# Patient Record
Sex: Female | Born: 1960
Health system: Southern US, Community
[De-identification: ages and names within clinical notes are randomized; demographics above are authoritative.]

## PROBLEM LIST (undated history)

## (undated) DIAGNOSIS — Z87442 Personal history of urinary calculi: Secondary | ICD-10-CM

## (undated) DIAGNOSIS — R002 Palpitations: Principal | ICD-10-CM

## (undated) DIAGNOSIS — E781 Pure hyperglyceridemia: Secondary | ICD-10-CM

## (undated) DIAGNOSIS — Z1211 Encounter for screening for malignant neoplasm of colon: Secondary | ICD-10-CM

## (undated) DIAGNOSIS — M75102 Unspecified rotator cuff tear or rupture of left shoulder, not specified as traumatic: Secondary | ICD-10-CM

## (undated) DIAGNOSIS — I10 Essential (primary) hypertension: Secondary | ICD-10-CM

## (undated) DIAGNOSIS — K648 Other hemorrhoids: Secondary | ICD-10-CM

## (undated) DIAGNOSIS — M797 Fibromyalgia: Secondary | ICD-10-CM

## (undated) DIAGNOSIS — E039 Hypothyroidism, unspecified: Secondary | ICD-10-CM

## (undated) DIAGNOSIS — IMO0002 Reserved for concepts with insufficient information to code with codable children: Secondary | ICD-10-CM

## (undated) DIAGNOSIS — G8929 Other chronic pain: Secondary | ICD-10-CM

## (undated) DIAGNOSIS — M170 Bilateral primary osteoarthritis of knee: Secondary | ICD-10-CM

## (undated) DIAGNOSIS — K589 Irritable bowel syndrome without diarrhea: Secondary | ICD-10-CM

## (undated) DIAGNOSIS — G4730 Sleep apnea, unspecified: Secondary | ICD-10-CM

## (undated) DIAGNOSIS — M792 Neuralgia and neuritis, unspecified: Secondary | ICD-10-CM

## (undated) DIAGNOSIS — S83529A Sprain of posterior cruciate ligament of unspecified knee, initial encounter: Secondary | ICD-10-CM

## (undated) DIAGNOSIS — K760 Fatty (change of) liver, not elsewhere classified: Secondary | ICD-10-CM

## (undated) DIAGNOSIS — J45909 Unspecified asthma, uncomplicated: Secondary | ICD-10-CM

## (undated) DIAGNOSIS — E559 Vitamin D deficiency, unspecified: Secondary | ICD-10-CM

## (undated) DIAGNOSIS — M545 Low back pain, unspecified: Secondary | ICD-10-CM

## (undated) DIAGNOSIS — F419 Anxiety disorder, unspecified: Secondary | ICD-10-CM

## (undated) DIAGNOSIS — T7840XA Allergy, unspecified, initial encounter: Secondary | ICD-10-CM

## (undated) HISTORY — DX: Reserved for concepts with insufficient information to code with codable children: IMO0002

## (undated) HISTORY — DX: Other hemorrhoids: K64.8

## (undated) HISTORY — DX: Hypothyroidism, unspecified: E03.9

## (undated) HISTORY — DX: Allergy, unspecified, initial encounter: T78.40XA

## (undated) HISTORY — DX: Unspecified rotator cuff tear or rupture of left shoulder, not specified as traumatic: M75.102

## (undated) HISTORY — DX: Unspecified asthma, uncomplicated: J45.909

## (undated) HISTORY — DX: Low back pain, unspecified: M54.50

## (undated) HISTORY — DX: Sprain of posterior cruciate ligament of unspecified knee, initial encounter: S83.529A

## (undated) HISTORY — PX: TOTAL ABDOMINAL HYSTERECTOMY W/ BILATERAL SALPINGOOPHORECTOMY: SHX83

## (undated) HISTORY — DX: Fatty (change of) liver, not elsewhere classified: K76.0

## (undated) HISTORY — DX: Vitamin D deficiency, unspecified: E55.9

## (undated) HISTORY — DX: Other chronic pain: G89.29

## (undated) HISTORY — DX: Bilateral primary osteoarthritis of knee: M17.0

## (undated) HISTORY — DX: Encounter for screening for malignant neoplasm of colon: Z12.11

## (undated) HISTORY — DX: Irritable bowel syndrome, unspecified: K58.9

## (undated) HISTORY — DX: Neuralgia and neuritis, unspecified: M79.2

## (undated) HISTORY — DX: Fibromyalgia: M79.7

## (undated) HISTORY — DX: Palpitations: R00.2

---

## 2001-09-03 ENCOUNTER — Encounter: Admission: RE | Admit: 2001-09-03 | Discharge: 2001-09-03 | Payer: Self-pay | Admitting: Family Medicine

## 2005-01-08 ENCOUNTER — Encounter: Admission: RE | Admit: 2005-01-08 | Discharge: 2005-01-08 | Payer: Self-pay | Admitting: Family Medicine

## 2011-03-25 ENCOUNTER — Encounter: Payer: Self-pay | Admitting: *Deleted

## 2011-03-25 DIAGNOSIS — E079 Disorder of thyroid, unspecified: Secondary | ICD-10-CM | POA: Insufficient documentation

## 2011-03-25 DIAGNOSIS — S8010XA Contusion of unspecified lower leg, initial encounter: Secondary | ICD-10-CM | POA: Insufficient documentation

## 2011-03-25 DIAGNOSIS — M79609 Pain in unspecified limb: Secondary | ICD-10-CM | POA: Insufficient documentation

## 2011-03-25 DIAGNOSIS — S92309A Fracture of unspecified metatarsal bone(s), unspecified foot, initial encounter for closed fracture: Secondary | ICD-10-CM | POA: Insufficient documentation

## 2011-03-25 DIAGNOSIS — I10 Essential (primary) hypertension: Secondary | ICD-10-CM | POA: Insufficient documentation

## 2011-03-25 DIAGNOSIS — W108XXA Fall (on) (from) other stairs and steps, initial encounter: Secondary | ICD-10-CM | POA: Insufficient documentation

## 2011-03-25 DIAGNOSIS — IMO0002 Reserved for concepts with insufficient information to code with codable children: Secondary | ICD-10-CM | POA: Insufficient documentation

## 2011-03-25 DIAGNOSIS — Z79899 Other long term (current) drug therapy: Secondary | ICD-10-CM | POA: Insufficient documentation

## 2011-03-25 DIAGNOSIS — S93609A Unspecified sprain of unspecified foot, initial encounter: Secondary | ICD-10-CM | POA: Insufficient documentation

## 2011-03-25 NOTE — ED Notes (Signed)
Pt fell off of brick steps onto a cement landing while taking the dog out. Injury to bilateral LE. Pt has abrasions/bruising to anterior left lower extremity

## 2011-03-26 ENCOUNTER — Emergency Department (INDEPENDENT_AMBULATORY_CARE_PROVIDER_SITE_OTHER)
Admit: 2011-03-26 | Discharge: 2011-03-26 | Disposition: A | Discharge status: Home / Self Care | Payer: Commercial Managed Care - PPO | Attending: Emergency Medicine | Admitting: Emergency Medicine

## 2011-03-26 ENCOUNTER — Emergency Department (HOSPITAL_BASED_OUTPATIENT_CLINIC_OR_DEPARTMENT_OTHER)
Admission: EM | Admit: 2011-03-26 | Discharge: 2011-03-26 | Disposition: A | Discharge status: Home / Self Care | Payer: Commercial Managed Care - PPO | Attending: Emergency Medicine | Admitting: Emergency Medicine

## 2011-03-26 DIAGNOSIS — T07XXXA Unspecified multiple injuries, initial encounter: Secondary | ICD-10-CM

## 2011-03-26 DIAGNOSIS — W19XXXA Unspecified fall, initial encounter: Secondary | ICD-10-CM

## 2011-03-26 DIAGNOSIS — M79609 Pain in unspecified limb: Secondary | ICD-10-CM

## 2011-03-26 DIAGNOSIS — S92314A Nondisplaced fracture of first metatarsal bone, right foot, initial encounter for closed fracture: Secondary | ICD-10-CM

## 2011-03-26 DIAGNOSIS — S93601A Unspecified sprain of right foot, initial encounter: Secondary | ICD-10-CM

## 2011-03-26 HISTORY — DX: Pure hyperglyceridemia: E78.1

## 2011-03-26 HISTORY — DX: Essential (primary) hypertension: I10

## 2011-03-26 MED ORDER — BACITRACIN 500 UNIT/GM EX OINT
1.0000 "application " | TOPICAL_OINTMENT | Freq: Two times a day (BID) | CUTANEOUS | Status: DC
  Administered 2011-03-26: 1 via TOPICAL
  Filled 2011-03-26: qty 0.9

## 2011-03-26 MED ORDER — HYDROCODONE-ACETAMINOPHEN 5-325 MG PO TABS
1.0000 | ORAL_TABLET | Freq: Four times a day (QID) | ORAL | Status: AC | PRN
Start: 2011-03-26 — End: 2011-04-05

## 2011-03-26 MED ORDER — TETANUS-DIPHTH-ACELL PERTUSSIS 5-2.5-18.5 LF-MCG/0.5 IM SUSP
0.5000 mL | Freq: Once | INTRAMUSCULAR | Status: AC
  Administered 2011-03-26: 0.5 mL via INTRAMUSCULAR

## 2011-03-26 MED ORDER — HYDROCODONE-ACETAMINOPHEN 5-325 MG PO TABS
1.0000 | ORAL_TABLET | Freq: Once | ORAL | Status: AC
  Administered 2011-03-26: 1 via ORAL
  Filled 2011-03-26: qty 1

## 2011-03-26 NOTE — ED Provider Notes (Addendum)
History     Chief Complaint  Patient presents with  . Leg Pain  . Fall   HPI She was at home yesterday evening about 10:30 when she was walking down some steps. She missed a step and fell injuring her right and left feet as well as her left lower leg. She is having moderate to severe pain in the right foot upon attempted ambulation. She is having moderate pain in the lateral left foot on attempted ambulation. There are abrasions of the left anterior lower leg. She denies neck pain or other injury. She does have pain associated with an L4 radiculopathy but this has not acutely changed. Past Medical History  Diagnosis Date  . High triglycerides   . Thyroid disease   . Hypertension     Past Surgical History  Procedure Date  . Abdominal hysterectomy     History reviewed. No pertinent family history.  History  Substance Use Topics  . Smoking status: Never Smoker   . Smokeless tobacco: Not on file  . Alcohol Use: Yes     rarely      Review of Systems  All other systems reviewed and are negative.    Physical Exam  BP 138/96  Pulse 78  Temp(Src) 98.1 F (36.7 C) (Oral)  Resp 18  Ht 5\' 3"  (1.6 m)  Wt 260 lb (117.935 kg)  BMI 46.06 kg/m2  SpO2 99%  Physical Exam General: Well-developed, well-nourished female in no acute distress; appearance consistent with age of record HEENT: normocephalic, atraumatic Eyes: pupils equal round and reactive to light; extraocular muscles intact Neck: supple; nontender Heart: regular rate and rhythm; no murmurs, rubs or gallops Lungs: clear to auscultation bilaterally Abdomen: soft; nontender; nondistended; no masses or hepatosplenomegaly; bowel sounds present Extremities: There is moderate point tenderness at the base of the right first metatarsal coinciding with a suspected fracture noted by the radiologist. There are multiple abrasions and ecchymoses of the left anterior lower leg. There is mild to moderate tenderness along the lateral  aspect of the left foot. There is no point tenderness at the point of the left navicular noted by the radiologist. Neurologic: Awake, alert and oriented;motor function intact in all extremities and symmetric;sensation grossly intact; no facial droop Psychiatric: Normal mood and affect   ED Course  Procedures  MDM Nursing notes and vitals signs reviewed.  Summary of this visit's results, reviewed by myself:  Labs:  No results found for this or any previous visit.  Imaging Studies: Dg Tibia/fibula Left  03/26/2011  *RADIOLOGY REPORT*  Clinical Data: 50 year old female status post fall with pain.  LEFT TIBIA AND FIBULA - 2 VIEW  Comparison: None.  Findings: Bone mineralization is within normal limits.  Grossly normal alignment at the knee and ankle.  Calcaneus appears intact. No ankle joint effusion.  Left tibia and fibula appear intact.  Cortical irregularity along the dorsal surface of the navicular. No definite soft tissue swelling in this region.  IMPRESSION: 1.  Left tibia and fibula appear intact. 2.  Cortical irregularity along the dorsal surface of the left navicular.  See addendum to the left foot report.  Original Report Authenticated By: Harley Hallmark, M.D.   Dg Foot Complete Left  03/26/2011  ADDENDUM CREATED: 03/26/2011 00:46:46  Cortical irregularity along the dorsal surface of the navicular, also seen on the left tib-fib series from today.  This might be degenerative, but if there is positive point tenderness suspect acute nondisplaced fracture.  END ADDENDUM SIGNED BY: H.  Augusto Gamble III, M.D.   03/26/2011  *RADIOLOGY REPORT*  Clinical Data: 49 year old female status post fall with pain.  LEFT FOOT - COMPLETE 3+ VIEW  Comparison: None.  Findings: Bone mineralization is within normal limits.  Calcaneus appears intact.  Joint spaces within normal limits.  No acute fracture or dislocation identified.  IMPRESSION: No acute fracture or dislocation identified about the left foot.  Original  Report Authenticated By: Harley Hallmark, M.D.   Dg Foot Complete Right  03/26/2011  *RADIOLOGY REPORT*  Clinical Data: 50 year old female with fall, pain.  RIGHT FOOT COMPLETE - 3+ VIEW  Comparison: None.  Findings: Bone mineralization is within normal limits.  Calcaneus appears intact.  Joint spaces within normal limits.  Subtle cortical irregularity at the medial base of the right first metatarsal.  This extends to the joint space with the medial cuneiform.  Otherwise no acute fracture or dislocation.  IMPRESSION: 1.  Irregularity suspicious for minimally-displaced fracture at the medial base of the right first metatarsal. Query point tenderness. 2.  Otherwise no acute fracture or dislocation in the right foot.  Original Report Authenticated By: Harley Hallmark, M.D.    We'll splint right lower extremity and provide crutches and orthopedic followup.      Hanley Seamen, MD 03/26/11 1610  Hanley Seamen, MD 03/26/11 [id number]  Hanley Seamen, MD 03/26/11 [id number]  Hanley Seamen, MD 03/26/11 Donnal Debar  Carlisle Beers Quinlyn Tep, MD 03/26/11 [id number]

## 2011-03-26 NOTE — ED Notes (Signed)
Abrasions to left lower leg cleaned, moist dressing applied. Pt refuses ice pack at this time.

## 2011-10-26 ENCOUNTER — Ambulatory Visit: Payer: 59

## 2011-10-26 ENCOUNTER — Ambulatory Visit (INDEPENDENT_AMBULATORY_CARE_PROVIDER_SITE_OTHER): Payer: 59 | Admitting: Internal Medicine

## 2011-10-26 VITALS — BP 150/84 | HR 86 | Temp 98.5°F | Resp 16 | Ht 63.0 in | Wt 271.0 lb

## 2011-10-26 DIAGNOSIS — R059 Cough, unspecified: Secondary | ICD-10-CM

## 2011-10-26 DIAGNOSIS — J40 Bronchitis, not specified as acute or chronic: Secondary | ICD-10-CM

## 2011-10-26 DIAGNOSIS — R05 Cough: Secondary | ICD-10-CM

## 2011-10-26 DIAGNOSIS — J9801 Acute bronchospasm: Secondary | ICD-10-CM

## 2011-10-26 LAB — POCT CBC
MCH, POC: 28 pg (ref 27–31.2)
MCV: 83.3 fL (ref 80–97)
MID (cbc): 0.7 (ref 0–0.9)
MPV: 8.1 fL (ref 0–99.8)
POC LYMPH PERCENT: 19.5 %L (ref 10–50)
POC MID %: 8.9 %M (ref 0–12)
Platelet Count, POC: 346 10*3/uL (ref 142–424)
RDW, POC: 14.6 %
WBC: 7.7 10*3/uL (ref 4.6–10.2)

## 2011-10-26 MED ORDER — BECLOMETHASONE DIPROPIONATE 40 MCG/ACT IN AERS: 1.0000 | INHALATION_SPRAY | Freq: Two times a day (BID) | RESPIRATORY_TRACT | Status: DC

## 2011-10-26 MED ORDER — AZITHROMYCIN 250 MG PO TABS: ORAL_TABLET | ORAL | Status: AC

## 2011-10-26 MED ORDER — ALBUTEROL SULFATE (2.5 MG/3ML) 0.083% IN NEBU
2.5000 mg | INHALATION_SOLUTION | Freq: Once | RESPIRATORY_TRACT | Status: AC
  Administered 2011-10-26: 2.5 mg via RESPIRATORY_TRACT

## 2011-10-26 MED ORDER — HYDROCODONE-HOMATROPINE 5-1.5 MG/5ML PO SYRP: ORAL_SOLUTION | ORAL | Status: AC

## 2011-10-26 MED ORDER — ALBUTEROL SULFATE HFA 108 (90 BASE) MCG/ACT IN AERS: 2.0000 | INHALATION_SPRAY | RESPIRATORY_TRACT | Status: DC | PRN

## 2011-10-26 MED ORDER — IPRATROPIUM BROMIDE 0.02 % IN SOLN
0.5000 mg | Freq: Once | RESPIRATORY_TRACT | Status: AC
  Administered 2011-10-26: 0.5 mg via RESPIRATORY_TRACT

## 2011-10-26 NOTE — Progress Notes (Signed)
Patient ID: Brenda Schroeder MRN: [id number], DOB: [birth year], 51 y.o. Date of Encounter: 10/26/2011, 9:00 AM  Primary Physician: Dr. Tresa Endo  Chief Complaint:  Chief Complaint  Patient presents with  . Wheezing    since Friday  . Cough    chest congestion    HPI: 51 y.o. year old female presents with 4 day history of cough and wheezing. Cough is mildly productive of yellow sputum, not associated with time of day. Wheezing worse at night. Subjective tactile fever and chills. Temperature at home has been 97. No nasal congestion, sore throat, or post nasal drip. Hearing is normal. Normal appetite. Does have an albuterol inhaler at home for allergic rhinitis. Has used this several times over the past few days with minimal results. Denies history of asthma. No tobacco use. No GI symptoms. No Recent antibiotics  No leg trauma, sedentary periods, h/o cancer, or tobacco use.  Past Medical History  Diagnosis Date  . High triglycerides   . Thyroid disease   . Hypertension      Home Meds: Prior to Admission medications   Medication Sig Start Date End Date Taking? Authorizing Provider  albuterol (PROVENTIL HFA;VENTOLIN HFA) 108 (90 BASE) MCG/ACT inhaler Inhale 2 puffs into the lungs every 6 (six) hours as needed.   Yes Historical Provider, MD  levothyroxine (SYNTHROID, LEVOTHROID) 150 MCG tablet Take 150 mcg by mouth daily.     Yes Historical Provider, MD  estrogens, conjugated, (PREMARIN) 0.625 MG tablet Take 0.625 mg by mouth daily. Take daily for 21 days then do not take for 7 days.    No Historical Provider, MD    Allergies:  Allergies  Allergen Reactions  . Nsaids Anaphylaxis  . Sulfa Antibiotics Rash    History   Social History  . Marital Status: Single    Spouse Name: N/A    Number of Children: N/A  . Years of Education: N/A   Occupational History  . Not on file.   Social History Main Topics  . Smoking status: Never Smoker   . Smokeless tobacco: Not on file  . Alcohol  Use: Yes     rarely  . Drug Use: No  . Sexually Active:    Other Topics Concern  . Not on file   Social History Narrative  . No narrative on file     Review of Systems: Constitutional: negative for chills, fever, night sweats or weight changes Cardiovascular: negative for chest pain or palpitations Respiratory: negative for hemoptysis, or shortness of breath Abdominal: negative for abdominal pain, nausea, vomiting or diarrhea Dermatological: negative for rash Neurologic: negative for headache   Physical Exam: Blood pressure 150/84, pulse 86, temperature 98.5 F (36.9 C), temperature source Oral, resp. rate 16, height 5\' 3"  (1.6 m), weight 271 lb (122.925 kg)., Body mass index is 48.01 kg/(m^2). General: Well developed, well nourished, in no acute distress. Head: Normocephalic, atraumatic, eyes without discharge, sclera non-icteric, nares are congested. Bilateral auditory canals clear, TM's are without perforation, pearly grey with reflective cone of light bilaterally. Oral cavity moist, dentition normal. Posterior pharynx with mild post nasal drip and mild erythema. No peritonsillar abscess or tonsillar exudate. Neck: Supple. No thyromegaly. Full ROM. No lymphadenopathy. Lungs: Coarse breath sounds and mild expiratory wheezing bilaterally without rales, or rhonchi. Breathing is unlabored. Heart: RRR with S1 S2. No murmurs, rubs, or gallops appreciated. Msk:  Strength and tone normal for age. Extremities: No clubbing or cyanosis. No edema. Neuro: Alert and oriented X 3. Moves all  extremities spontaneously. CNII-XII grossly in tact. Psych:  Responds to questions appropriately with a normal affect.   S/P albuterol/atrovent neb: Feels better. Wheezing improved. S/p 2nd albuterol neb: continues to improve. No further wheezing.  Labs:  [facility] reading (PRIMARY) by  Dr. Perrin Maltese. Increased markings bilaterally without infiltrate.   ASSESSMENT AND PLAN:  51 y.o. year old female with  bronchitis/RAD/cough -Azithromycin 250 MG #6 2 po first day then 1 po next 4 days no RF -Hycodan #4oz 1 tsp po q 4-6 hours prn cough no RF SED -Qvar 40 mcg #1 1 puff inhaled bid no RF -Proventil #1 2 puffs inhaled q 4-6 hours prn Rf 2 -Albuterol neb in office x 2 -Atrovent neb in office -Mucinex -Tylenol/Motrin prn -Rest/fluids -RTC precautions -RTC 3-5 days if no improvement  Signed, Eula Listen, PA-C 10/26/2011 9:00 AM

## 2011-10-28 ENCOUNTER — Ambulatory Visit (INDEPENDENT_AMBULATORY_CARE_PROVIDER_SITE_OTHER): Payer: 59 | Admitting: Family Medicine

## 2011-10-28 VITALS — BP 146/89 | HR 67 | Temp 98.0°F | Resp 16 | Ht 62.5 in | Wt 270.8 lb

## 2011-10-28 DIAGNOSIS — Z79899 Other long term (current) drug therapy: Secondary | ICD-10-CM

## 2011-10-28 DIAGNOSIS — J9801 Acute bronchospasm: Secondary | ICD-10-CM

## 2011-10-28 DIAGNOSIS — R0609 Other forms of dyspnea: Secondary | ICD-10-CM

## 2011-10-28 DIAGNOSIS — R05 Cough: Secondary | ICD-10-CM

## 2011-10-28 DIAGNOSIS — R0600 Dyspnea, unspecified: Secondary | ICD-10-CM

## 2011-10-28 DIAGNOSIS — R059 Cough, unspecified: Secondary | ICD-10-CM

## 2011-10-28 DIAGNOSIS — R0989 Other specified symptoms and signs involving the circulatory and respiratory systems: Secondary | ICD-10-CM

## 2011-10-28 LAB — POCT CBC
Granulocyte percent: 59.1 %G (ref 37–80)
HCT, POC: 39.9 % (ref 37.7–47.9)
Hemoglobin: 12.8 g/dL (ref 12.2–16.2)
POC Granulocyte: 4 (ref 2–6.9)
RBC: 4.74 M/uL (ref 4.04–5.48)

## 2011-10-28 LAB — GLUCOSE, POCT (MANUAL RESULT ENTRY): POC Glucose: 88

## 2011-10-28 MED ORDER — IPRATROPIUM BROMIDE 0.02 % IN SOLN
0.5000 mg | Freq: Once | RESPIRATORY_TRACT | Status: AC
  Administered 2011-10-28: 0.5 mg via RESPIRATORY_TRACT

## 2011-10-28 MED ORDER — PREDNISONE 20 MG PO TABS: ORAL_TABLET | ORAL | Status: DC

## 2011-10-28 MED ORDER — ALBUTEROL SULFATE (2.5 MG/3ML) 0.083% IN NEBU
2.5000 mg | INHALATION_SOLUTION | Freq: Once | RESPIRATORY_TRACT | Status: AC
  Administered 2011-10-28: 2.5 mg via RESPIRATORY_TRACT

## 2011-10-28 NOTE — Patient Instructions (Addendum)
Continue antibiotic, proventil every 4 to 6 hours as needed, and start prednisone.  Recheck with Eula Listen in 2 days, sooner if worsening.  Return to the clinic or go to the nearest emergency room if any of your symptoms worsen or new symptoms occur.

## 2011-10-28 NOTE — Progress Notes (Signed)
Subjective:    Patient ID: Brenda Schroeder, female    DOB: August 05, 1961, 51 y.o.   MRN: [id number]  HPI  Brenda Schroeder is a 51 y.o. female  Here for f/u cough and wheeze. Seen 2 days ago - 2 sets of nebs given in office,  Treatment at discharge:  Azithromycin 250 MG #6 2 po first day then 1 po next 4 days no RF -Hycodan #4oz 1 tsp po q 4-6 hours prn cough no RF SED -Qvar 40 mcg #1 1 puff inhaled bid no RF -Proventil #1 2 puffs inhaled q 4-6 hours prn Rf 2 -Albuterol neb in office x 2 -Atrovent neb in office -Mucinex -Tylenol/Motrin prn -Rest/fluids  Feels about the same.  Still with wheeze with activity, or cold exposure.  Using proventil every 4 hours, not much change in symptoms, qvar bid, hycodan only as needed - used last night at bedtime. Subjective fever only.  Notes wheeze when lying down. At night. Feels worse lying down.  Props self up to sleep.  More dyspnea if flat.     No hx CHF, only PVC's in past. No leg edema.  No chest  Pains.  Review of Systems  Constitutional: Negative for fever and chills.  HENT: Negative for congestion and rhinorrhea.   Respiratory: Positive for cough, shortness of breath and wheezing.   Cardiovascular: Negative for chest pain and leg swelling.  Skin: Negative for rash.       Objective:   Physical Exam  Constitutional: She is oriented to person, place, and time. She appears well-developed.       overweight  HENT:  Head: Normocephalic and atraumatic.  Cardiovascular: Normal rate, regular rhythm, normal heart sounds and intact distal pulses.   Pulmonary/Chest: Effort normal. No respiratory distress. She has wheezes. She has no rales.  Musculoskeletal: She exhibits no edema.  Neurological: She is alert and oriented to person, place, and time.  Skin: Skin is warm.  Psychiatric: She has a normal mood and affect.   Results for orders placed in visit on 10/28/11  POCT CBC      Component Value Range   WBC 6.7  4.6 - 10.2 (K/uL)   Lymph, poc 2.1   0.6 - 3.4    POC LYMPH PERCENT 30.7  10 - 50 (%L)   MID (cbc) 0.7  0 - 0.9    POC MID % 10.2  0 - 12 (%M)   POC Granulocyte 4.0  2 - 6.9    Granulocyte percent 59.1  37 - 80 (%G)   RBC 4.74  4.04 - 5.48 (M/uL)   Hemoglobin 12.8  12.2 - 16.2 (g/dL)   HCT, POC 40.9  81.1 - 47.9 (%)   MCV 84.1  80 - 97 (fL)   MCH, POC 27.0  27 - 31.2 (pg)   MCHC 32.1  31.8 - 35.4 (g/dL)   RDW, POC 91.4     Platelet Count, POC 328  142 - 424 (K/uL)   MPV 7.8  0 - 99.8 (fL)  GLUCOSE, POCT (MANUAL RESULT ENTRY)      Component Value Range   POC Glucose 88       After nebulizer treatment, lungs clear, and symptomatically improved    Assessment & Plan:   1. Bronchospasm  albuterol (PROVENTIL) (2.5 MG/3ML) 0.083% nebulizer solution 2.5 mg, ipratropium (ATROVENT) nebulizer solution 0.5 mg  2. Dyspnea  POCT CBC, Brain natriuretic peptide  3. Cough  POCT CBC, Brain natriuretic peptide   Nebs given as  above.  Suspect underling RAD/asthamtic bronchitis/flair.  Doubt cardiac cause, but with dyspnea and orthopnea, check BNP.  DDX includes restrictive component/obesity hypoventilation syndrome, but currently suspect bronchospasm as primary cause.  O2 sat stable (95%).  CXR report pending.  Cont azithromycin, proventil q4-6 hrs prn, and QVAR as prescribed.   Check BNP. Start prednisone 20mg  -2 qd x 5 days. Recheck in 2 days with Eula Listen, PA-C, sooner if worse.

## 2011-10-29 LAB — BRAIN NATRIURETIC PEPTIDE: Brain Natriuretic Peptide: 61.5 pg/mL (ref 0.0–100.0)

## 2011-11-17 ENCOUNTER — Ambulatory Visit (INDEPENDENT_AMBULATORY_CARE_PROVIDER_SITE_OTHER): Payer: 59 | Admitting: Family Medicine

## 2011-11-17 VITALS — BP 118/80 | HR 84 | Temp 98.5°F | Resp 18 | Ht 62.5 in | Wt 268.2 lb

## 2011-11-17 DIAGNOSIS — J45901 Unspecified asthma with (acute) exacerbation: Secondary | ICD-10-CM

## 2011-11-17 DIAGNOSIS — E039 Hypothyroidism, unspecified: Secondary | ICD-10-CM

## 2011-11-17 DIAGNOSIS — J45909 Unspecified asthma, uncomplicated: Secondary | ICD-10-CM | POA: Insufficient documentation

## 2011-11-17 MED ORDER — MOMETASONE FURO-FORMOTEROL FUM 200-5 MCG/ACT IN AERO: 2.0000 | INHALATION_SPRAY | Freq: Two times a day (BID) | RESPIRATORY_TRACT | Status: DC

## 2011-11-17 MED ORDER — ALBUTEROL SULFATE (2.5 MG/3ML) 0.083% IN NEBU
2.5000 mg | INHALATION_SOLUTION | Freq: Once | RESPIRATORY_TRACT | Status: AC
  Administered 2011-11-17: 2.5 mg via RESPIRATORY_TRACT

## 2011-11-17 MED ORDER — ALBUTEROL SULFATE (2.5 MG/3ML) 0.083% IN NEBU: 2.5000 mg | INHALATION_SOLUTION | Freq: Four times a day (QID) | RESPIRATORY_TRACT | Status: DC | PRN

## 2011-11-17 MED ORDER — PREDNISONE 20 MG PO TABS: ORAL_TABLET | ORAL | Status: DC

## 2011-11-17 NOTE — Progress Notes (Signed)
51 yo woman with asthma who is here for the third time here.  Her first episode was treated in October when she started with ventolin and z-pack.  Continued to have mild symptoms.  First came here in February 17 and then again February 19.  Prednisone made the most difference.  Still on rescue inhaler and Qvar. 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  O:  Overweight woman in NAD with good color HEENT:  Unremarkable Chest:  Diffuse expiratory wheezes. Heart:  Reg, no murmur  A:  Asthma, to some extent related to the work environment with the home visits.  P:  Nebulizer treatments with albuterol Dulera 200 2 puffs bid. Prednisone 20 3-2-1 Rechk Friday.

## 2011-11-17 NOTE — Patient Instructions (Signed)
Return this FridayAsthma Attack Prevention HOW CAN ASTHMA BE PREVENTED? Currently, there is no way to prevent asthma from starting. However, you can take steps to control the disease and prevent its symptoms after you have been diagnosed. Learn about your asthma and how to control it. Take an active role to control your asthma by working with your caregiver to create and follow an asthma action plan. An asthma action plan guides you in taking your medicines properly, avoiding factors that make your asthma worse, tracking your level of asthma control, responding to worsening asthma, and seeking emergency care when needed. To track your asthma, keep records of your symptoms, check your peak flow number using a peak flow meter (handheld device that shows how well air moves out of your lungs), and get regular asthma checkups.  Other ways to prevent asthma attacks include:  Use medicines as your caregiver directs.   Identify and avoid things that make your asthma worse (as much as you can).   Keep track of your asthma symptoms and level of control.   Get regular checkups for your asthma.   With your caregiver, write a detailed plan for taking medicines and managing an asthma attack. Then be sure to follow your action plan. Asthma is an ongoing condition that needs regular monitoring and treatment.   Identify and avoid asthma triggers. A number of outdoor allergens and irritants (pollen, mold, cold air, air pollution) can trigger asthma attacks. Find out what causes or makes your asthma worse, and take steps to avoid those triggers (see below).   Monitor your breathing. Learn to recognize warning signs of an attack, such as slight coughing, wheezing or shortness of breath. However, your lung function may already decrease before you notice any signs or symptoms, so regularly measure and record your peak airflow with a home peak flow meter.   Identify and treat attacks early. If you act quickly, you're  less likely to have a severe attack. You will also need less medicine to control your symptoms. When your peak flow measurements decrease and alert you to an upcoming attack, take your medicine as instructed, and immediately stop any activity that may have triggered the attack. If your symptoms do not improve, get medical help.   Pay attention to increasing quick-relief inhaler use. If you find yourself relying on your quick-relief inhaler (such as albuterol), your asthma is not under control. See your caregiver about adjusting your treatment.  IDENTIFY AND CONTROL FACTORS THAT MAKE YOUR ASTHMA WORSE A number of common things can set off or make your asthma symptoms worse (asthma triggers). Keep track of your asthma symptoms for several weeks, detailing all the environmental and emotional factors that are linked with your asthma. When you have an asthma attack, go back to your asthma diary to see which factor, or combination of factors, might have contributed to it. Once you know what these factors are, you can take steps to control many of them.  Allergies: If you have allergies and asthma, it is important to take asthma prevention steps at home. Asthma attacks (worsening of asthma symptoms) can be triggered by allergies, which can cause temporary increased inflammation of your airways. Minimizing contact with the substance to which you are allergic will help prevent an asthma attack. Animal Dander:   Some people are allergic to the flakes of skin or dried saliva from animals with fur or feathers. Keep these pets out of your home.   If you can't keep a pet outdoors,  keep the pet out of your bedroom and other sleeping areas at all times, and keep the door closed.   Remove carpets and furniture covered with cloth from your home. If that is not possible, keep the pet away from fabric-covered furniture and carpets.  Dust Mites:  Many people with asthma are allergic to dust mites. Dust mites are tiny bugs  that are found in every home, in mattresses, pillows, carpets, fabric-covered furniture, bedcovers, clothes, stuffed toys, fabric, and other fabric-covered items.   Cover your mattress in a special dust-proof cover.   Cover your pillow in a special dust-proof cover, or wash the pillow each week in hot water. Water must be hotter than 130 F to kill dust mites. Cold or warm water used with detergent and bleach can also be effective.   Wash the sheets and blankets on your bed each week in hot water.   Try not to sleep or lie on cloth-covered cushions.   Call ahead when traveling and ask for a smoke-free hotel room. Bring your own bedding and pillows, in case the hotel only supplies feather pillows and down comforters, which may contain dust mites and cause asthma symptoms.   Remove carpets from your bedroom and those laid on concrete, if you can.   Keep stuffed toys out of the bed, or wash the toys weekly in hot water or cooler water with detergent and bleach.  Cockroaches:  Many people with asthma are allergic to the droppings and remains of cockroaches.   Keep food and garbage in closed containers. Never leave food out.   Use poison baits, traps, powders, gels, or paste (for example, boric acid).   If a spray is used to kill cockroaches, stay out of the room until the odor goes away.  Indoor Mold:  Fix leaky faucets, pipes, or other sources of water that have mold around them.   Clean moldy surfaces with a cleaner that has bleach in it.  Pollen and Outdoor Mold:  When pollen or mold spore counts are high, try to keep your windows closed.   Stay indoors with windows closed from late morning to afternoon, if you can. Pollen and some mold spore counts are highest at that time.   Ask your caregiver whether you need to take or increase anti-inflammatory medicine before your allergy season starts.  Irritants:   Tobacco smoke is an irritant. If you smoke, ask your caregiver how you can  quit. Ask family members to quit smoking, too. Do not allow smoking in your home or car.   If possible, do not use a wood-burning stove, kerosene heater, or fireplace. Minimize exposure to all sources of smoke, including incense, candles, fires, and fireworks.   Try to stay away from strong odors and sprays, such as perfume, talcum powder, hair spray, and paints.   Decrease humidity in your home and use an indoor air cleaning device. Reduce indoor humidity to below 60 percent. Dehumidifiers or central air conditioners can do this.   Try to have someone else vacuum for you once or twice a week, if you can. Stay out of rooms while they are being vacuumed and for a short while afterward.   If you vacuum, use a dust mask from a hardware store, a double-layered or microfilter vacuum cleaner bag, or a vacuum cleaner with a HEPA filter.   Sulfites in foods and beverages can be irritants. Do not drink beer or wine, or eat dried fruit, processed potatoes, or shrimp if they  cause asthma symptoms.   Cold air can trigger an asthma attack. Cover your nose and mouth with a scarf on cold or windy days.   Several health conditions can make asthma more difficult to manage, including runny nose, sinus infections, reflux disease, psychological stress, and sleep apnea. Your caregiver will treat these conditions, as well.   Avoid close contact with people who have a cold or the flu, since your asthma symptoms may get worse if you catch the infection from them. Wash your hands thoroughly after touching items that may have been handled by people with a respiratory infection.   Get a flu shot every year to protect against the flu virus, which often makes asthma worse for days or weeks. Also get a pneumonia shot once every five to 10 years.  Drugs:  Aspirin and other painkillers can cause asthma attacks. 10% to 20% of people with asthma have sensitivity to aspirin or a group of painkillers called non-steroidal  anti-inflammatory drugs (NSAIDS), such as ibuprofen and naproxen. These drugs are used to treat pain and reduce fevers. Asthma attacks caused by any of these medicines can be severe and even fatal. These drugs must be avoided in people who have known aspirin sensitive asthma. Products with acetaminophen are considered safe for people who have asthma. It is important that people with aspirin sensitivity read labels of all over-the-counter drugs used to treat pain, colds, coughs, and fever.   Beta blockers and ACE inhibitors are other drugs which you should discuss with your caregiver, in relation to your asthma.  ALLERGY SKIN TESTING  Ask your asthma caregiver about allergy skin testing or blood testing (RAST test) to identify the allergens to which you are sensitive. If you are found to have allergies, allergy shots (immunotherapy) for asthma may help prevent future allergies and asthma. With allergy shots, small doses of allergens (substances to which you are allergic) are injected under your skin on a regular schedule. Over a period of time, your body may become used to the allergen and less responsive with asthma symptoms. You can also take measures to minimize your exposure to those allergens. EXERCISE  If you have exercise-induced asthma, or are planning vigorous exercise, or exercise in cold, humid, or dry environments, prevent exercise-induced asthma by following your caregiver's advice regarding asthma treatment before exercising. Document Released: 08/13/2009 Document Revised: 08/14/2011 Document Reviewed: 08/13/2009 [facility] Patient Information 2012 [place], [place].

## 2012-01-06 ENCOUNTER — Ambulatory Visit: Payer: 59

## 2012-01-14 ENCOUNTER — Telehealth: Payer: Self-pay

## 2012-01-14 NOTE — Telephone Encounter (Signed)
PLEASE LOCATE DAILY AND GIVE TO TL.  COMPUTERS WERE DOWN FOR A LITTLE BIT ON Sunday AND IT LOOKS LIKE DR Hal Hope DID NOT PUT NOTE IN.

## 2012-01-14 NOTE — Telephone Encounter (Signed)
Pt left msg on phone, saw Dr. Hal Hope on Sunday and was looking for a callback re: orthopedic referral.  Nothing in epic re: either the visit or the referral

## 2012-01-14 NOTE — Telephone Encounter (Signed)
Chart is in workers Counselling psychologist. Thomasene Lot will call patient regarding referral.

## 2012-07-05 ENCOUNTER — Encounter: Payer: 59 | Admitting: Family Medicine

## 2012-07-26 ENCOUNTER — Ambulatory Visit (INDEPENDENT_AMBULATORY_CARE_PROVIDER_SITE_OTHER): Payer: 59 | Admitting: Family Medicine

## 2012-07-26 ENCOUNTER — Encounter: Payer: Self-pay | Admitting: Family Medicine

## 2012-07-26 VITALS — BP 125/84 | HR 71 | Temp 98.4°F | Resp 17 | Ht 62.0 in | Wt 276.0 lb

## 2012-07-26 DIAGNOSIS — Z0000 Encounter for general adult medical examination without abnormal findings: Secondary | ICD-10-CM

## 2012-07-26 DIAGNOSIS — E039 Hypothyroidism, unspecified: Secondary | ICD-10-CM

## 2012-07-26 DIAGNOSIS — R5381 Other malaise: Secondary | ICD-10-CM

## 2012-07-26 DIAGNOSIS — R5383 Other fatigue: Secondary | ICD-10-CM

## 2012-07-26 DIAGNOSIS — R319 Hematuria, unspecified: Secondary | ICD-10-CM

## 2012-07-26 DIAGNOSIS — Z23 Encounter for immunization: Secondary | ICD-10-CM

## 2012-07-26 LAB — LIPID PANEL
Total CHOL/HDL Ratio: 5.8 Ratio
VLDL: 57 mg/dL — ABNORMAL HIGH (ref 0–40)

## 2012-07-26 LAB — POCT URINALYSIS DIPSTICK
Bilirubin, UA: NEGATIVE
Glucose, UA: NEGATIVE
Ketones, UA: NEGATIVE
Spec Grav, UA: 1.02
Urobilinogen, UA: 0.2

## 2012-07-26 LAB — POCT UA - MICROSCOPIC ONLY
Bacteria, U Microscopic: NEGATIVE
Casts, Ur, LPF, POC: NEGATIVE

## 2012-07-26 LAB — CBC WITH DIFFERENTIAL/PLATELET
Basophils Relative: 0 % (ref 0–1)
Eosinophils Absolute: 0.2 10*3/uL (ref 0.0–0.7)
Lymphs Abs: 2.3 10*3/uL (ref 0.7–4.0)
MCH: 28 pg (ref 26.0–34.0)
Neutrophils Relative %: 55 % (ref 43–77)
Platelets: 334 10*3/uL (ref 150–400)
RBC: 4.97 MIL/uL (ref 3.87–5.11)

## 2012-07-26 LAB — COMPREHENSIVE METABOLIC PANEL
ALT: 23 U/L (ref 0–35)
AST: 21 U/L (ref 0–37)
Chloride: 102 mEq/L (ref 96–112)
Creat: 0.68 mg/dL (ref 0.50–1.10)
Total Bilirubin: 0.7 mg/dL (ref 0.3–1.2)

## 2012-07-26 LAB — TSH: TSH: 6.879 u[IU]/mL — ABNORMAL HIGH (ref 0.350–4.500)

## 2012-07-26 MED ORDER — LEVOTHYROXINE SODIUM 150 MCG PO TABS: 150.0000 ug | ORAL_TABLET | Freq: Every day | ORAL | Status: DC

## 2012-07-26 NOTE — Patient Instructions (Addendum)
Your should receive a call or letter about your lab results within the next week to 10 days.  Follow up in the next few weeks to discuss your labs, fatigue, possible sleep study and other concerns on physical form Return to the clinic or go to the nearest emergency room if any of your symptoms worsen or new symptoms occur.  Keeping You Healthy  Get These Tests  Blood Pressure- Have your blood pressure checked by your healthcare provider at least once a year.  Normal blood pressure is 120/80.  Weight- Have your body mass index (BMI) calculated to screen for obesity.  BMI is a measure of body fat based on height and weight.  You can calculate your own BMI at https://www.west-esparza.com/  Cholesterol- Have your cholesterol checked every year.  Diabetes- Have your blood sugar checked every year if you have high blood pressure, high cholesterol, a family history of diabetes or if you are overweight.  Pap Smear- Have a pap smear every 1 to 3 years if you have been sexually active.  If you are older than 65 and recent pap smears have been normal you may not need additional pap smears.  In addition, if you have had a hysterectomy  For benign disease additional pap smears are not necessary.  Mammogram-Yearly mammograms are essential for early detection of breast cancer  Screening for Colon Cancer- Colonoscopy starting at age 7. Screening may begin sooner depending on your family history and other health conditions.  Follow up colonoscopy as directed by your Gastroenterologist.  Screening for Osteoporosis- Screening begins at age 69 with bone density scanning, sooner if you are at higher risk for developing Osteoporosis.  Get these medicines  Calcium with Vitamin D- Your body requires 1200-1500 mg of Calcium a day and [phone] IU of Vitamin D a day.  You can only absorb 500 mg of Calcium at a time therefore Calcium must be taken in 2 or 3 separate doses throughout the day.  Hormones- Hormone therapy  has been associated with increased risk for certain cancers and heart disease.  Talk to your healthcare provider about if you need relief from menopausal symptoms.  Aspirin- Ask your healthcare provider about taking Aspirin to prevent Heart Disease and Stroke.  Get these Immuniztions  Flu shot- Every fall  Pneumonia shot- Once after the age of 72; if you are younger ask your healthcare provider if you need a pneumonia shot.  Tetanus- Every ten years.  Zostavax- Once after the age of 51 to prevent shingles.  Take these steps  Don't smoke- Your healthcare provider can help you quit. For tips on how to quit, ask your healthcare provider or go to www.smokefree.gov or call 1-800 QUIT-NOW.  Be physically active- Exercise 5 days a week for a minimum of 30 minutes.  If you are not already physically active, start slow and gradually work up to 30 minutes of moderate physical activity.  Try walking, dancing, bike riding, swimming, etc.  Eat a healthy diet- Eat a variety of healthy foods such as fruits, vegetables, whole grains, low fat milk, low fat cheeses, yogurt, lean meats, chicken, fish, eggs, dried beans, tofu, etc.  For more information go to www.thenutritionsource.org  Dental visit- Brush and floss teeth twice daily; visit your dentist twice a year.  Eye exam- Visit your Optometrist or Ophthalmologist yearly.  Drink alcohol in moderation- Limit alcohol intake to one drink or less a day.  Never drink and drive.  Depression- Your emotional health is as important  as your physical health.  If you're feeling down or losing interest in things you normally enjoy, please talk to your healthcare provider.  Seat Belts- can save your life; always wear one  Smoke/Carbon Monoxide detectors- These detectors need to be installed on the appropriate level of your home.  Replace batteries at least once a year.  Violence- If anyone is threatening or hurting you, please tell your healthcare  provider.  Living Will/ Health care power of attorney- Discuss with your healthcare provider and family.

## 2012-07-26 NOTE — Progress Notes (Signed)
Subjective:    Patient ID: Brenda Schroeder, female    DOB: April 14, 1961, 51 y.o.   MRN: [id number]  HPI Brenda Schroeder is a 51 y.o. female  Here for CPE. Flu vaccine given today. Last pap atleast 2 years ago.  Partial hysterectomy in 2006, then total few years later (daughter had ovarian cancer).  Unknown recommendation for further screening.  No history of colonoscopy.  Sister has Celiac disease. Last mammogram in May of last year - normal. Last td in 2012, flu vaccine today.   Multiple concerns on health survey below.  Plan on return office visits to discuss these.   Hx of hypothyroidism, Hasimoto's thyroiditis in 2008. Last TSH 1.313  paperwork brought to OV (from Leretha Pol 06/24/11 PA at Dr. Olivia Canter in [place] until May of this year).  Possibly chronic kidney disease - stage 3 in 2008, but last creatinine 0.68 in 2012.   Fasting today.   Fatigue - vit D 26 in 10/12.   More fatigue past year. Ongoing fatigue - ? Lack of homones - prior on premarin and vagifem for 3-4 years after hysterctomy. On prozac at one point due to hot flushes, more tearful - possible depression symptoms. Out of work since April.  Denies depression, no SI.   On neurontin for leg pains - Dr. Penni Bombard at [facility].  Urinary urgency comes and goes.    Review of Systems Multiple responses on ROS - see cma note.  Reviewed.      Objective:   Physical Exam  Constitutional: She is oriented to person, place, and time. She appears well-developed and well-nourished.  HENT:  Head: Normocephalic and atraumatic.  Right Ear: External ear normal.  Left Ear: External ear normal.  Mouth/Throat: Oropharynx is clear and moist.  Eyes: Conjunctivae normal are normal. Pupils are equal, round, and reactive to light.  Neck: Normal range of motion. Neck supple. No thyromegaly present.  Cardiovascular: Normal rate, regular rhythm, normal heart sounds and intact distal pulses.   No murmur heard. Pulmonary/Chest:  Effort normal and breath sounds normal. No respiratory distress. She has no wheezes.  Abdominal: Soft. Bowel sounds are normal. There is no tenderness.  Genitourinary: Vagina normal. Pelvic exam was performed with patient supine. Right adnexum displays no mass. Left adnexum displays no mass.       S/p hysterectomy. Pap of vaginal cuff.    Musculoskeletal: Normal range of motion. She exhibits no edema and no tenderness.  Lymphadenopathy:    She has no cervical adenopathy.  Neurological: She is alert and oriented to person, place, and time.  Skin: Skin is warm and dry. No rash noted.  Psychiatric: She has a normal mood and affect. Her behavior is normal. Thought content normal.   Results for orders placed in visit on 07/26/12  POCT UA - MICROSCOPIC ONLY      Component Value Range   WBC, Ur, HPF, POC 0-1     RBC, urine, microscopic 0-5     Bacteria, U Microscopic neg     Mucus, UA trace     Epithelial cells, urine per micros 0-8     Crystals, Ur, HPF, POC neg     Casts, Ur, LPF, POC neg     Yeast, UA neg    POCT URINALYSIS DIPSTICK      Component Value Range   Color, UA yellow     Clarity, UA clear     Glucose, UA neg     Bilirubin, UA neg  Ketones, UA neg     Spec Grav, UA 1.020     Blood, UA moderate     pH, UA 5.0     Protein, UA neg     Urobilinogen, UA 0.2     Nitrite, UA neg     Leukocytes, UA Negative           Assessment & Plan:  [name] is a 51 y.o. female 1. Need for prophylactic vaccination and inoculation against influenza  Flu vaccine greater than or equal to 3yo preservative free IM, POCT UA - Microscopic Only, POCT urinalysis dipstick  2. Routine general medical examination at a health care facility  Pap IG w/ reflex to HPV when ASC-U, CBC with Differential, TSH, Comprehensive metabolic panel, Lipid panel, Vitamin D 25 hydroxy  3. Hypothyroidism  levothyroxine (SYNTHROID, LEVOTHROID) 150 MCG tablet  4. Fatigue  levothyroxine (SYNTHROID,  LEVOTHROID) 150 MCG tablet   CPE - Flu vaccine given Up to date on td.  Referred for colonoscopy. Pt. to schedule mammogram. Pap and labs as above. Anticipatory guidance as below.   Hypothyroidism, fatigue.  Check TSH, lytes, vit D and recheck in next few weeks.  Refilled same dose of synthroid for now.   Plan to follow up for other concerns on ROS/health survey.   Hematuria - microscopic - hx of intermittent urgency. Check urine culture - discuss further at follow up.   Patient Instructions  Your should receive a call or letter about your lab results within the next week to 10 days.  Follow up in the next few weeks to discuss your labs, fatigue, possible sleep study and other concerns on physical form Return to the clinic or go to the nearest emergency room if any of your symptoms worsen or new symptoms occur.  Keeping You Healthy  Get These Tests  Blood Pressure- Have your blood pressure checked by your healthcare provider at least once a year.  Normal blood pressure is 120/80.  Weight- Have your body mass index (BMI) calculated to screen for obesity.  BMI is a measure of body fat based on height and weight.  You can calculate your own BMI at https://www.west-esparza.com/  Cholesterol- Have your cholesterol checked every year.  Diabetes- Have your blood sugar checked every year if you have high blood pressure, high cholesterol, a family history of diabetes or if you are overweight.  Pap Smear- Have a pap smear every 1 to 3 years if you have been sexually active.  If you are older than 65 and recent pap smears have been normal you may not need additional pap smears.  In addition, if you have had a hysterectomy  For benign disease additional pap smears are not necessary.  Mammogram-Yearly mammograms are essential for early detection of breast cancer  Screening for Colon Cancer- Colonoscopy starting at age 43. Screening may begin sooner depending on your family history and other health  conditions.  Follow up colonoscopy as directed by your Gastroenterologist.  Screening for Osteoporosis- Screening begins at age 2 with bone density scanning, sooner if you are at higher risk for developing Osteoporosis.  Get these medicines  Calcium with Vitamin D- Your body requires 1200-1500 mg of Calcium a day and [phone] IU of Vitamin D a day.  You can only absorb 500 mg of Calcium at a time therefore Calcium must be taken in 2 or 3 separate doses throughout the day.  Hormones- Hormone therapy has been associated with increased risk for certain cancers and heart  disease.  Talk to your healthcare provider about if you need relief from menopausal symptoms.  Aspirin- Ask your healthcare provider about taking Aspirin to prevent Heart Disease and Stroke.  Get these Immuniztions  Flu shot- Every fall  Pneumonia shot- Once after the age of 98; if you are younger ask your healthcare provider if you need a pneumonia shot.  Tetanus- Every ten years.  Zostavax- Once after the age of 33 to prevent shingles.  Take these steps  Don't smoke- Your healthcare provider can help you quit. For tips on how to quit, ask your healthcare provider or go to www.smokefree.gov or call 1-800 QUIT-NOW.  Be physically active- Exercise 5 days a week for a minimum of 30 minutes.  If you are not already physically active, start slow and gradually work up to 30 minutes of moderate physical activity.  Try walking, dancing, bike riding, swimming, etc.  Eat a healthy diet- Eat a variety of healthy foods such as fruits, vegetables, whole grains, low fat milk, low fat cheeses, yogurt, lean meats, chicken, fish, eggs, dried beans, tofu, etc.  For more information go to www.thenutritionsource.org  Dental visit- Brush and floss teeth twice daily; visit your dentist twice a year.  Eye exam- Visit your Optometrist or Ophthalmologist yearly.  Drink alcohol in moderation- Limit alcohol intake to one drink or less a day.   Never drink and drive.  Depression- Your emotional health is as important as your physical health.  If you're feeling down or losing interest in things you normally enjoy, please talk to your healthcare provider.  Seat Belts- can save your life; always wear one  Smoke/Carbon Monoxide detectors- These detectors need to be installed on the appropriate level of your home.  Replace batteries at least once a year.  Violence- If anyone is threatening or hurting you, please tell your healthcare provider.  Living Will/ Health care power of attorney- Discuss with your healthcare provider and family.

## 2012-07-26 NOTE — Progress Notes (Signed)
  Subjective:    Patient ID: Brenda Schroeder, female    DOB: [birth year], 51 y.o.   MRN: [id number]  HPI    Review of Systems  Constitutional: Positive for fatigue.  HENT: Negative.   Eyes: Negative.   Respiratory: Negative.   Cardiovascular: Negative.   Gastrointestinal: Negative.   Genitourinary: Positive for urgency.  Musculoskeletal: Negative.   Neurological: Positive for numbness.  Hematological: Negative.   Psychiatric/Behavioral: Negative.        Objective:   Physical Exam        Assessment & Plan:

## 2012-07-27 LAB — PAP IG W/ RFLX HPV ASCU

## 2012-07-27 LAB — VITAMIN D 25 HYDROXY (VIT D DEFICIENCY, FRACTURES): Vit D, 25-Hydroxy: 30 ng/mL (ref 30–89)

## 2012-07-28 LAB — URINE CULTURE

## 2012-08-16 ENCOUNTER — Encounter: Payer: Self-pay | Admitting: Family Medicine

## 2012-08-16 ENCOUNTER — Ambulatory Visit (INDEPENDENT_AMBULATORY_CARE_PROVIDER_SITE_OTHER): Payer: 59 | Admitting: Family Medicine

## 2012-08-16 VITALS — BP 134/98 | HR 66 | Temp 98.3°F | Resp 16 | Ht 62.5 in | Wt 274.2 lb

## 2012-08-16 DIAGNOSIS — E785 Hyperlipidemia, unspecified: Secondary | ICD-10-CM

## 2012-08-16 DIAGNOSIS — R5383 Other fatigue: Secondary | ICD-10-CM

## 2012-08-16 DIAGNOSIS — R3915 Urgency of urination: Secondary | ICD-10-CM

## 2012-08-16 DIAGNOSIS — E039 Hypothyroidism, unspecified: Secondary | ICD-10-CM

## 2012-08-16 DIAGNOSIS — R319 Hematuria, unspecified: Secondary | ICD-10-CM

## 2012-08-16 LAB — POCT URINALYSIS DIPSTICK
Bilirubin, UA: NEGATIVE
Ketones, UA: NEGATIVE
pH, UA: 6.5

## 2012-08-16 LAB — POCT UA - MICROSCOPIC ONLY
Bacteria, U Microscopic: NEGATIVE
Casts, Ur, LPF, POC: NEGATIVE
Crystals, Ur, HPF, POC: NEGATIVE

## 2012-08-16 MED ORDER — LEVOTHYROXINE SODIUM 175 MCG PO TABS: 175.0000 ug | ORAL_TABLET | Freq: Every day | ORAL | Status: DC

## 2012-08-16 NOTE — Patient Instructions (Addendum)
Your increased dose of synthroid was sent to the pharmacy.  Recheck office visit in 6 weeks to recheck these levels.  Continue to work on diet changes and increase in exercise and activity for the elevated cholesterol. Plan on recheck cholesterol in 3 months.  We will refer you to a urologist for the blood in the urine.  Return to the clinic or go to the nearest emergency room if any of your symptoms worsen or new symptoms occur.

## 2012-08-16 NOTE — Progress Notes (Signed)
Subjective:    Patient ID: Brenda Schroeder, female    DOB: 1960-09-30, 51 y.o.   MRN: [id number]  HPI Brenda Schroeder is a 51 y.o. female  Had CPE with me 07/26/12. Here for follow up of some of concerns at that ov.   Hypothyroidism - Hx of Hasimoto's thyroiditis in 2008. TSH 1.313 - paperwork brought from Brenda Schroeder 06/24/11 PA at Dr. Olivia Schroeder in [place] until May of this year. TSH 6.87 at last ov. Did admit to some increase in fatigue this year, with recent Vit D WNL.   Hyperlipidemia - hx of high trilycerides. In work Product manager - in physical therapy.  Paternal gf with MI - unknown intital age of CAD,   Hematuria - microscopic at last ov. reassurring urine cx as above. Urinary urgency comes and goes per hx at last ov. Still urgency symptoms few times per week.  Mam and dad had blood in their urine - but benign.  No history of bladder cancers, nonsmoker.   Results for orders placed in visit on 07/26/12  POCT UA - MICROSCOPIC ONLY      Component Value Range   WBC, Ur, HPF, POC 0-1     RBC, urine, microscopic 0-5     Bacteria, U Microscopic neg     Mucus, UA trace     Epithelial cells, urine per micros 0-8     Crystals, Ur, HPF, POC neg     Casts, Ur, LPF, POC neg     Yeast, UA neg    POCT URINALYSIS DIPSTICK      Component Value Range   Color, UA yellow     Clarity, UA clear     Glucose, UA neg     Bilirubin, UA neg     Ketones, UA neg     Spec Grav, UA 1.020     Blood, UA moderate     pH, UA 5.0     Protein, UA neg     Urobilinogen, UA 0.2     Nitrite, UA neg     Leukocytes, UA Negative    PAP IG W/ RFLX HPV ASCU      Component Value Range   Specimen adequacy:       FINAL DIAGNOSIS:       Cytotechnologist:      CBC WITH DIFFERENTIAL      Component Value Range   WBC 6.8  4.0 - 10.5 K/uL   RBC 4.97  3.87 - 5.11 MIL/uL   Hemoglobin 13.9  12.0 - 15.0 g/dL   HCT 40.9  81.1 - 91.4 %   MCV 82.3  78.0 - 100.0 fL   MCH 28.0  26.0 - 34.0 pg   MCHC  34.0  30.0 - 36.0 g/dL   RDW 78.2  95.6 - 21.3 %   Platelets 334  150 - 400 K/uL   Neutrophils Relative 55  43 - 77 %   Neutro Abs 3.7  1.7 - 7.7 K/uL   Lymphocytes Relative 35  12 - 46 %   Lymphs Abs 2.3  0.7 - 4.0 K/uL   Monocytes Relative 7  3 - 12 %   Monocytes Absolute 0.5  0.1 - 1.0 K/uL   Eosinophils Relative 3  0 - 5 %   Eosinophils Absolute 0.2  0.0 - 0.7 K/uL   Basophils Relative 0  0 - 1 %   Basophils Absolute 0.0  0.0 - 0.1 K/uL   Smear Review Criteria for review not  met    TSH      Component Value Range   TSH 6.879 (*) 0.350 - 4.500 uIU/mL  COMPREHENSIVE METABOLIC PANEL      Component Value Range   Sodium 137  135 - 145 mEq/L   Potassium 3.7  3.5 - 5.3 mEq/L   Chloride 102  96 - 112 mEq/L   CO2 27  19 - 32 mEq/L   Glucose, Bld 87  70 - 99 mg/dL   BUN 12  6 - 23 mg/dL   Creat 1.61  0.96 - 0.45 mg/dL   Total Bilirubin 0.7  0.3 - 1.2 mg/dL   Alkaline Phosphatase 50  39 - 117 U/L   AST 21  0 - 37 U/L   ALT 23  0 - 35 U/L   Total Protein 7.3  6.0 - 8.3 g/dL   Albumin 4.5  3.5 - 5.2 g/dL   Calcium 9.7  8.4 - 40.9 mg/dL  LIPID PANEL      Component Value Range   Cholesterol 254 (*) 0 - 200 mg/dL   Triglycerides 811 (*) <150 mg/dL   HDL 44  >91 mg/dL   Total CHOL/HDL Ratio 5.8     VLDL 57 (*) 0 - 40 mg/dL   LDL Cholesterol 478 (*) 0 - 99 mg/dL  VITAMIN D 25 HYDROXY      Component Value Range   Vit D, 25-Hydroxy 30  30 - 89 ng/mL  URINE CULTURE      Component Value Range   Colony Count 20,OOO COLONIES/ML     Organism ID, Bacteria Multiple bacterial morphotypes present, none     Organism ID, Bacteria predominant. Suggest appropriate recollection if      Organism ID, Bacteria clinically indicated.      Review of Systems  Constitutional: Positive for fatigue. Negative for fever, chills, diaphoresis and unexpected weight change.  Gastrointestinal: Negative for abdominal pain.  Genitourinary: Positive for urgency. Negative for hematuria.       Objective:    Physical Exam  Constitutional: She is oriented to person, place, and time. She appears well-developed and well-nourished.  HENT:  Head: Normocephalic and atraumatic.  Eyes: Conjunctivae normal and EOM are normal. Pupils are equal, round, and reactive to light.  Neck: Carotid bruit is not present. Thyromegaly (fullness, no discernable nodularity on exam. ) present.  Cardiovascular: Normal rate, regular rhythm, normal heart sounds and intact distal pulses.   Pulmonary/Chest: Effort normal and breath sounds normal.  Abdominal: Soft. She exhibits no distension and no pulsatile midline mass. There is no tenderness.  Neurological: She is alert and oriented to person, place, and time.  Skin: Skin is warm and dry.  Psychiatric: She has a normal mood and affect. Her behavior is normal.     Results for orders placed in visit on 08/16/12  POCT URINALYSIS DIPSTICK      Component Value Range   Color, UA yellow     Clarity, UA clear     Glucose, UA neg     Bilirubin, UA neg     Ketones, UA neg     Spec Grav, UA 1.020     Blood, UA small     pH, UA 6.5     Protein, UA neg     Urobilinogen, UA 0.2     Nitrite, UA neg     Leukocytes, UA Trace    POCT UA - MICROSCOPIC ONLY      Component Value Range   WBC, Ur, HPF,  POC 0-2     RBC, urine, microscopic 0-4     Bacteria, U Microscopic neg     Mucus, UA trace     Epithelial cells, urine per micros 0-3     Crystals, Ur, HPF, POC neg     Casts, Ur, LPF, POC neg     Yeast, UA neg          Assessment & Plan:  Brenda Schroeder is a 51 y.o. female 1. Hypothyroidism  - increase dose of synthroid, recheck levels in 6 weeks.   2. Hematuria  - fh of same, but will have evaluated at urology to determine if further workup needed.   3. Hyperlipidemia  - discussed options - working on diet and exercise, and recheck in 3 months.   4. Fatigue  - likely thyroid.    5. Urinary urgency - as above.    Recheck in 6 weeks then 3 months.   Patient  Instructions  Your increased dose of synthroid was sent to the pharmacy.  Recheck office visit in 6 weeks to recheck these levels.  Continue to work on diet changes and increase in exercise and activity for the elevated cholesterol. Plan on recheck cholesterol in 3 months.

## 2012-09-08 HISTORY — PX: COLONOSCOPY W/ POLYPECTOMY: SHX1380

## 2012-09-27 ENCOUNTER — Encounter: Payer: Self-pay | Admitting: Family Medicine

## 2012-09-27 ENCOUNTER — Ambulatory Visit (INDEPENDENT_AMBULATORY_CARE_PROVIDER_SITE_OTHER): Payer: 59 | Admitting: Family Medicine

## 2012-09-27 VITALS — BP 120/76 | HR 68 | Temp 98.0°F | Resp 16 | Ht 63.0 in | Wt 273.0 lb

## 2012-09-27 DIAGNOSIS — E039 Hypothyroidism, unspecified: Secondary | ICD-10-CM

## 2012-09-27 DIAGNOSIS — R002 Palpitations: Secondary | ICD-10-CM

## 2012-09-27 DIAGNOSIS — R5383 Other fatigue: Secondary | ICD-10-CM

## 2012-09-27 DIAGNOSIS — R5381 Other malaise: Secondary | ICD-10-CM

## 2012-09-27 LAB — CBC
MCV: 80.9 fL (ref 78.0–100.0)
Platelets: 296 10*3/uL (ref 150–400)
RBC: 5.03 MIL/uL (ref 3.87–5.11)
RDW: 14.2 % (ref 11.5–15.5)
WBC: 6.1 10*3/uL (ref 4.0–10.5)

## 2012-09-27 LAB — TSH: TSH: 0.472 u[IU]/mL (ref 0.350–4.500)

## 2012-09-27 NOTE — Progress Notes (Signed)
Subjective:    Patient ID: Brenda Schroeder, female    DOB: August 14, 1961, 52 y.o.   MRN: [id number]  HPI Brenda Schroeder is a 52 y.o. female See prior ov's.   Hypothyroidism - increased synthroid at last ov as TSH elevated in November, and hx of fatigue past year.  Taking 175 mcg qd.  Since 08/16/12 - taking everyday.  No missed doses.  Still feels tired. Feels drained. Lack of energy in general.  Denies depressed mood. No anhedonia. Still gets up to do things.  Now working full time since Jan 2nd (had been out of work since 4/13, but was in work conditioning).  No chest pain, but has had heart palpitations in past - last few weeks - has had bigeminal pvc's in past, was treated with lisinopril for blood pressure, then norvasc, but caused ankle to swell. Occasional myalgias.  Hysterectomy in around 2008. TAH and oopherectomy (daughter had ovarian CA).  S/p HRT (Premarin 6.25 and Vagifem) for few years, but taken off due to concerns of FH of cancer, and insurance coverage.  Fatigue - vit D 26 in 10/12, 30 in November of last year.  No vitamin d supplements at this point.  Overweight/obese - but stable weight overall. No dark tarry stools or blood in the stool. No hot flushes.   No alcohol, no caffeine.   Has urology follow up scheduled for urinary symptoms in February.   Review of Systems  Constitutional: Negative for fever, chills and unexpected weight change.  Respiratory: Negative for chest tightness and shortness of breath.   Cardiovascular: Positive for palpitations (rare heart "flutter".). Negative for chest pain.  Gastrointestinal: Negative for abdominal pain, blood in stool and anal bleeding.  Musculoskeletal: Positive for myalgias.  Skin: Negative for color change.   And as above      Objective:   Physical Exam  Vitals reviewed. Constitutional: She is oriented to person, place, and time. She appears well-developed and well-nourished. No distress.       Overweight/obesity.   HENT:    Head: Normocephalic and atraumatic.  Eyes: Conjunctivae normal and EOM are normal. Pupils are equal, round, and reactive to light.  Neck: Neck supple. Carotid bruit is not present. No thyromegaly present.  Cardiovascular: Normal rate, regular rhythm, normal heart sounds and intact distal pulses.  Exam reveals no gallop and no friction rub.   No murmur heard.      No ectopy  Pulmonary/Chest: Effort normal and breath sounds normal.  Abdominal: She exhibits no pulsatile midline mass.  Neurological: She is alert and oriented to person, place, and time.  Skin: Skin is warm and dry. She is not diaphoretic.  Psychiatric: She has a normal mood and affect. Her behavior is normal. Judgment and thought content normal.       Slightly flat affect, but appropriate in responses.    EKG: NSR, no acute findings noted.      Assessment & Plan:  Brenda Schroeder is a 52 y.o. female 1. Other malaise and fatigue  EKG 12-Lead, TSH, Vitamin D, 25-hydroxy, CBC  2. Palpitations  EKG 12-Lead, CBC  3. Hypothyroidism  TSH   Fatigue - possibly multifactorial - hx of borderline vitamin D, and last TSH indicating undercontrolled thyroid. Denies depression sx's, but with intermittent myalgias, DDX includes fibromyalgia. Overweight and possible deconditioning component. Will check CBC, TSH, and vitamin D level.  Ok to start otc vitamin D, and plan on recheck in 1-2 months.  Will be due for repeat lipids at that  time.   Palpitations - intermittent. Reassuring EKG,.  Check CBC, TSH, rtc or er if increased frequency.   Patient Instructions  You can take over the counter vitamin D 800 units each day, but we should have the lab results in the next week. Your should receive a call or letter about your lab results within the next week to 10 days. We will also check a blood count.  If these tests are normal, there may be other contributors to fatigue that we can discuss further at next office visit.  Plan to recheck cholesterol  in the next 1-2 months.  Return to the clinic or go to the nearest emergency room if any of your symptoms worsen or new symptoms occur.

## 2012-09-27 NOTE — Patient Instructions (Addendum)
You can take over the counter vitamin D 800 units each day, but we should have the lab results in the next week. Your should receive a call or letter about your lab results within the next week to 10 days. We will also check a blood count.  If these tests are normal, there may be other contributors to fatigue that we can discuss further at next office visit.  Plan to recheck cholesterol in the next 1-2 months.  Return to the clinic or go to the nearest emergency room if any of your symptoms worsen or new symptoms occur.

## 2012-09-28 LAB — VITAMIN D 25 HYDROXY (VIT D DEFICIENCY, FRACTURES): Vit D, 25-Hydroxy: 23 ng/mL — ABNORMAL LOW (ref 30–89)

## 2012-11-10 ENCOUNTER — Other Ambulatory Visit: Payer: Self-pay | Admitting: Family Medicine

## 2012-12-13 DIAGNOSIS — M5136 Other intervertebral disc degeneration, lumbar region: Secondary | ICD-10-CM | POA: Insufficient documentation

## 2012-12-21 ENCOUNTER — Other Ambulatory Visit: Payer: Self-pay | Admitting: Physician Assistant

## 2013-01-12 ENCOUNTER — Other Ambulatory Visit: Payer: Self-pay | Admitting: Family Medicine

## 2013-02-07 ENCOUNTER — Ambulatory Visit: Payer: 59 | Admitting: Family Medicine

## 2013-02-18 ENCOUNTER — Ambulatory Visit (INDEPENDENT_AMBULATORY_CARE_PROVIDER_SITE_OTHER): Payer: Self-pay | Admitting: Nurse Practitioner

## 2013-02-18 ENCOUNTER — Encounter: Payer: Self-pay | Admitting: Nurse Practitioner

## 2013-02-18 VITALS — BP 102/72 | HR 72 | Resp 12 | Ht 63.0 in | Wt 269.8 lb

## 2013-02-18 DIAGNOSIS — E039 Hypothyroidism, unspecified: Secondary | ICD-10-CM

## 2013-02-18 DIAGNOSIS — E559 Vitamin D deficiency, unspecified: Secondary | ICD-10-CM

## 2013-02-18 LAB — CBC
HCT: 41.3 % (ref 36.0–46.0)
Hemoglobin: 13.8 g/dL (ref 12.0–15.0)
Platelets: 334 10*3/uL (ref 150.0–400.0)
RBC: 4.89 Mil/uL (ref 3.87–5.11)
WBC: 6 10*3/uL (ref 4.5–10.5)

## 2013-02-18 NOTE — Patient Instructions (Addendum)
For weight loss: Calorie goal should be 1600 - 1800 calories daily. Activity goal is 15 minutes of walking at least 5 days weekly. This should produce a weight loss of 1 to 2 pounds weekly. I will call if meds need to be adjusted based on labs. Pleasure to meet you today! See you in a month or so.

## 2013-02-18 NOTE — Progress Notes (Signed)
Subjective:     Brenda Schroeder is a 52 y.o. female and is here to establish care. The patient reports a history of Hashimoto's hypothyroidism, spinal cysts that cause back pain, desire to lose weight, feeling anxious and crying more than usual lately.Marland Kitchen  History   Social History  . Marital Status: Married    Spouse Name: N/A    Number of Children: N/A  . Years of Education: N/A   Occupational History  . Not on file.   Social History Main Topics  . Smoking status: Never Smoker   . Smokeless tobacco: Not on file  . Alcohol Use: Not on file     Comment: rarely  . Drug Use: Not on file  . Sexually Active: Yes -- Female partner(s)    Birth Control/ Protection: None   Other Topics Concern  . Not on file   Social History Narrative  . No narrative on file   Health Maintenance  Topic Date Due  . Influenza Vaccine  05/09/2013  . Mammogram  08/03/2014  . Pap Smear  07/27/2015  . Tetanus/tdap  03/25/2021  . Colonoscopy  11/16/2022    The following portions of the patient's history were reviewed and updated as appropriate: allergies, current medications, past family history, past medical history, past social history, past surgical history and problem list.  Review of Systems A comprehensive review of systems was negative except for: Constitutional: positive for obesity Respiratory: positive for asthma symptoms triggered by going into homes where people smoke. No longer is required to do this. Gastrointestinal: positive for occasional diarrhea when eats restaraunt food. Musculoskeletal: positive for back pain and spinal cysts that cause chronic back pain, for which she has had injection with no relief.  Neurological: positive for L leg numbness on occasion Behavioral/Psych: positive for feeling anxious & ctying more than usual lately, but states has had a sick friend. Endocrine: positive for Hx of Hashimoto hypothyroid, under current treatment, no current symptoms   Objective:    BP 102/72  Pulse 72  Resp 12  Ht 5\' 3"  (1.6 m)  Wt 269 lb 12.8 oz (122.38 kg)  BMI 47.8 kg/m2 General appearance: alert, cooperative, appears stated age and no distress Head: Normocephalic, without obvious abnormality, atraumatic Eyes: negative findings: lids and lashes normal, conjunctivae and sclerae normal, corneas clear and pupils equal, round, reactive to light and accomodation Ears: normal TM's and external ear canals both ears Nose: Nares normal. Septum midline. Mucosa normal. No drainage or sinus tenderness. Throat: lips, mucosa, and tongue normal; teeth and gums normal Lungs: clear to auscultation bilaterally Heart: regular rate and rhythm, S1, S2 normal, no murmur, click, rub or gallop Abdomen: soft, non-tender; bowel sounds normal; no masses,  no organomegaly and obese Extremities: extremities normal, atraumatic, no cyanosis or edema and wearing compression socks Pulses: 2+ and symmetric Neurologic: Alert and oriented X 3, normal strength and tone. Normal symmetric reflexes. Normal coordination and gait    Assessment:   1.Hx. Vit D def 2. Hypothyroidism 3.Obesity class III   Plan:  1. Vit D level today 2.TSH, free T3 & T4 today 3.Plan calorie count & increase activity level for goal weight loss of 1-2 lb/wk   See After Visit Summary for Counseling Recommendations

## 2013-02-21 ENCOUNTER — Telehealth: Payer: Self-pay | Admitting: Nurse Practitioner

## 2013-02-21 DIAGNOSIS — E039 Hypothyroidism, unspecified: Secondary | ICD-10-CM

## 2013-02-21 DIAGNOSIS — E559 Vitamin D deficiency, unspecified: Secondary | ICD-10-CM

## 2013-02-21 MED ORDER — LEVOTHYROXINE SODIUM 150 MCG PO TABS: 150.0000 ug | ORAL_TABLET | Freq: Every day | ORAL | Status: DC

## 2013-02-21 NOTE — Telephone Encounter (Signed)
Decrease synthroid from 175 mcg to 150 mcg, recheck TSH in 6 wks. Increase Vit D to 6000 iu daily, recheck in 12 weeks. Will see again in 6 wks to discuss weight loss & med changes.

## 2013-02-22 NOTE — Telephone Encounter (Signed)
Patient returned call and was given lab results. Patient stated that she understood and that she will change medication dose. Patient also stated that she will make follow up lab appointments however the 6 week f/u she would push off for a couple more weeks.

## 2013-03-21 ENCOUNTER — Telehealth: Payer: Self-pay | Admitting: Nurse Practitioner

## 2013-03-21 ENCOUNTER — Encounter: Payer: Commercial Managed Care - PPO | Admitting: Nurse Practitioner

## 2013-03-21 DIAGNOSIS — E039 Hypothyroidism, unspecified: Secondary | ICD-10-CM

## 2013-03-21 NOTE — Progress Notes (Signed)
This encounter was created in error - please disregard.

## 2013-03-21 NOTE — Telephone Encounter (Signed)
Pt missed appt. Today. Need to check TSH & free T4 as changed synthroid last visit. Also want to check for potential complications associated with hashimoto's hypothyroid: renal function, lipids, muscle enzymes. At next visit will get ECG, CXR, (screen for rhythm disturbance, pleural effusion) and US neck to screen for thyroid nodules-will discuss w/pt at f/u. Will call today to see if she can come in for labs.

## 2013-03-21 NOTE — Telephone Encounter (Signed)
Left detailed message on patient's home vm.

## 2013-04-01 NOTE — Telephone Encounter (Signed)
Left 2nd message for to return call to schedule lab appointment.

## 2013-04-14 ENCOUNTER — Encounter: Payer: Self-pay | Admitting: Radiology

## 2013-04-14 DIAGNOSIS — M5136 Other intervertebral disc degeneration, lumbar region: Secondary | ICD-10-CM

## 2013-04-15 ENCOUNTER — Encounter: Payer: Self-pay | Admitting: Nurse Practitioner

## 2013-04-15 ENCOUNTER — Telehealth: Payer: Self-pay | Admitting: Nurse Practitioner

## 2013-04-15 NOTE — Telephone Encounter (Signed)
Letter sent to pt to have thyroid labs completed. Needs lab appt. Thyroid med was adjusted 7 weeks ago. Unsuccessful attempts to reach by phone.

## 2013-04-15 NOTE — Telephone Encounter (Signed)
Called patient for 3rd time. Patient has not returned call.

## 2013-05-20 ENCOUNTER — Encounter: Payer: Self-pay | Admitting: Radiology

## 2013-05-20 ENCOUNTER — Other Ambulatory Visit: Payer: Self-pay | Admitting: *Deleted

## 2013-05-20 DIAGNOSIS — E039 Hypothyroidism, unspecified: Secondary | ICD-10-CM

## 2013-05-20 DIAGNOSIS — N200 Calculus of kidney: Secondary | ICD-10-CM | POA: Insufficient documentation

## 2013-05-20 MED ORDER — LEVOTHYROXINE SODIUM 150 MCG PO TABS: 150.0000 ug | ORAL_TABLET | Freq: Every day | ORAL | Status: DC

## 2013-08-08 ENCOUNTER — Other Ambulatory Visit: Payer: Self-pay | Admitting: *Deleted

## 2013-08-08 DIAGNOSIS — E039 Hypothyroidism, unspecified: Secondary | ICD-10-CM

## 2013-08-08 MED ORDER — LEVOTHYROXINE SODIUM 150 MCG PO TABS: 150.0000 ug | ORAL_TABLET | Freq: Every day | ORAL | Status: DC

## 2013-10-05 ENCOUNTER — Ambulatory Visit (INDEPENDENT_AMBULATORY_CARE_PROVIDER_SITE_OTHER): Payer: BC Managed Care – PPO | Admitting: Family Medicine

## 2013-10-05 ENCOUNTER — Other Ambulatory Visit: Payer: Self-pay | Admitting: Family Medicine

## 2013-10-05 ENCOUNTER — Encounter: Payer: Self-pay | Admitting: Family Medicine

## 2013-10-05 VITALS — BP 105/74 | HR 85 | Temp 99.2°F | Resp 18 | Ht 63.0 in | Wt 271.0 lb

## 2013-10-05 DIAGNOSIS — F3289 Other specified depressive episodes: Secondary | ICD-10-CM

## 2013-10-05 DIAGNOSIS — Z0000 Encounter for general adult medical examination without abnormal findings: Secondary | ICD-10-CM | POA: Insufficient documentation

## 2013-10-05 DIAGNOSIS — F329 Major depressive disorder, single episode, unspecified: Secondary | ICD-10-CM

## 2013-10-05 DIAGNOSIS — R5381 Other malaise: Secondary | ICD-10-CM

## 2013-10-05 DIAGNOSIS — E039 Hypothyroidism, unspecified: Secondary | ICD-10-CM

## 2013-10-05 DIAGNOSIS — Z1239 Encounter for other screening for malignant neoplasm of breast: Secondary | ICD-10-CM

## 2013-10-05 DIAGNOSIS — R5383 Other fatigue: Secondary | ICD-10-CM | POA: Insufficient documentation

## 2013-10-05 DIAGNOSIS — E559 Vitamin D deficiency, unspecified: Secondary | ICD-10-CM | POA: Insufficient documentation

## 2013-10-05 DIAGNOSIS — F32A Depression, unspecified: Secondary | ICD-10-CM | POA: Insufficient documentation

## 2013-10-05 MED ORDER — BUPROPION HCL ER (XL) 150 MG PO TB24: 150.0000 mg | ORAL_TABLET | Freq: Every day | ORAL | Status: DC

## 2013-10-05 NOTE — Assessment & Plan Note (Signed)
Reviewed age and gender appropriate health maintenance issues (prudent diet, regular exercise, health risks of tobacco and excessive alcohol, use of seatbelts, fire alarms in home, use of sunscreen).  Also reviewed age and gender appropriate health screening as well as vaccine recommendations. Mammogram ordered today. She will not need pelvic/pap screening anymore in the future. Ordered CMET, TSH, and FLP today (future, when fasting).

## 2013-10-05 NOTE — Assessment & Plan Note (Signed)
Hx of. Last recheck shortly after getting started on 2000 IU qd replacement was low normal range. She does want another check today "to see if I have to stay on this med". This was ordered for future when she gets upcoming fasting labs done.

## 2013-10-05 NOTE — Progress Notes (Signed)
Pre visit review using our clinic review tool, if applicable. No additional management support is needed unless otherwise documented below in the visit note. 

## 2013-10-05 NOTE — Progress Notes (Signed)
Office Note 10/05/2013  CC:  Chief Complaint  Patient presents with  . Annual Exam    HPI:  Brenda Schroeder is a 53 y.o. White female who is here for CPE.  She is new to me but has been seen by Maximino SarinLayne Weaver, FNP here in the past.  Also desires to start something for anxiety/depression. Has had success with prozac in the past.  Works for [employer], medication mgmt evaluations for NH/ALF patients. Family drama/stress is described.   Has had the following sx's for years: She feels overwhelmed, withdraws, loss of patience, loss of concentration, anhedonia, no sex drive.  No SI or HI, no halluc or delusions.  Has had hx of what she felt was mild panic attacks.  +Worries excessively about everything.  Energy level is low.  Gets to sleep fine and maintains it fine.  Gets approx 6-7 hours sleep/night.  Family members say she snores.  No witnessed apneic spells. Prozac helped in past but she felt like she gained wt on it and would be interested in trial of something else. She does not smoke. Exercise: none Diet: No restrictions at this time.  Past Medical History  Diagnosis Date  . High triglycerides   . Thyroid disease   . Hypertension   . Allergy   . Asthma   . Neuromuscular disorder   . Herniated disc L4-L5  . Sprain of posterior cruciate ligament of knee right knee  . Internal hemorrhoids   . IBS (irritable bowel syndrome)   . Neuropathic pain     Rx'd neurontin by her orthopedist    Past Surgical History  Procedure Laterality Date  . Total abdominal hysterectomy w/ bilateral salpingoophorectomy  approx 2006    Nonmalignant reasons  . Colonoscopy w/ polypectomy  09/2012    Dr. Loreta AveMann: diverticulosis.  Recall 5 yrs.    Family History  Problem Relation Age of Onset  . Arthritis Mother     osteo arithritis  . Obesity Mother   . Fibroids Mother     uterine  . Lymphoma Father   . Sarcoidosis Father   . Pulmonary fibrosis Father   . Arthritis Maternal Grandmother      rheumatoid  . Cancer Maternal Grandmother     cervical  . Heart disease Maternal Grandfather     a-fib  . COPD Maternal Grandfather     emphysema  . Vision loss Paternal Grandmother   . Emphysema Paternal Grandfather   . Lung cancer Paternal Grandfather   . Celiac disease Sister   . Asthma Son   . Cancer Daughter     ovarian  . Obesity Sister     History   Social History  . Marital Status: Married    Spouse Name: N/A    Number of Children: N/A  . Years of Education: N/A   Occupational History  . Not on file.   Social History Main Topics  . Smoking status: Never Smoker   . Smokeless tobacco: Not on file  . Alcohol Use: Not on file     Comment: rarely  . Drug Use: Not on file  . Sexual Activity: Yes    Partners: Male    Birth Control/ Protection: None   Other Topics Concern  . Not on file   Social History Narrative   Married 26 yrs, 3 children (1 daught, 2 sons).   [place]   Occupation: Works for [employer].   No tobacco, very rare alcohol, no drugs.  Outpatient Prescriptions Prior to Visit  Medication Sig Dispense Refill  . gabapentin (NEURONTIN) 100 MG capsule Take 100 mg by mouth 3 (three) times daily.      Marland Kitchen levothyroxine (SYNTHROID, LEVOTHROID) 150 MCG tablet Take 1 tablet (150 mcg total) by mouth daily.  30 tablet  1  . albuterol (PROVENTIL HFA;VENTOLIN HFA) 108 (90 BASE) MCG/ACT inhaler Inhale 2 puffs into the lungs every 4 (four) hours as needed for wheezing.  1 Inhaler  2  . albuterol (PROVENTIL) (2.5 MG/3ML) 0.083% nebulizer solution Take 3 mLs (2.5 mg total) by nebulization every 6 (six) hours as needed for wheezing.  75 mL  12  . beclomethasone (QVAR) 40 MCG/ACT inhaler Inhale 1 puff into the lungs 2 (two) times daily.  1 Inhaler  0  . HYDROcodone-homatropine (HYCODAN) 5-1.5 MG/5ML syrup Take by mouth every 6 (six) hours as needed.      . Mometasone Furo-Formoterol Fum 200-5 MCG/ACT AERO Inhale 2 puffs into the lungs 2  (two) times daily.  1 Inhaler  3  . predniSONE (DELTASONE) 20 MG tablet 2 by mouth each day for 5 days.  10 tablet  0  . predniSONE (DELTASONE) 20 MG tablet 3-2-1 daily with food  6 tablet  0   No facility-administered medications prior to visit.  Not taking prednisone, beclamethasone, or mometa/formot or hycodan as listed above  Allergies  Allergen Reactions  . Nsaids Anaphylaxis  . Aspirin     Short of breath, nasal congestion  . Sulfa Antibiotics Rash    ROS Review of Systems  Constitutional: Negative for fever, chills, weight loss, appetite change and fatigue.  HENT: Negative for congestion, dental problem, ear pain and sore throat.   Eyes: Negative for discharge, redness and visual disturbance.  Respiratory: Negative for cough, chest tightness, shortness of breath and wheezing.   Cardiovascular: Negative for chest pain, palpitations and leg swelling.  Gastrointestinal: Negative for nausea, vomiting, abdominal pain, diarrhea and blood in stool.  Genitourinary: Negative for dysuria, urgency, frequency, hematuria, flank pain and difficulty urinating.  Musculoskeletal: Negative for arthralgias, back pain, joint swelling, myalgias and neck stiffness.  Skin: Negative for pallor and rash.  Neurological: Negative for dizziness, speech difficulty, weakness and headaches.  Endo/Heme/Allergies: Negative for adenopathy. Does not bruise/bleed easily.  Psychiatric/Behavioral: Negative for sleep disturbance.       See HPI  Review of Systems  Constitutional: Negative for fever, chills, weight loss, appetite change and fatigue.  HENT: Negative for congestion, dental problem, ear pain and sore throat.   Eyes: Negative for discharge, redness and visual disturbance.  Respiratory: Negative for cough, chest tightness, shortness of breath and wheezing.   Cardiovascular: Negative for chest pain, palpitations and leg swelling.  Gastrointestinal: Negative for nausea, vomiting, abdominal pain, diarrhea  and blood in stool.  Genitourinary: Negative for dysuria, urgency, frequency, hematuria, flank pain and difficulty urinating.  Musculoskeletal: Negative for arthralgias, back pain, joint swelling, myalgias and neck stiffness.  Skin: Negative for pallor and rash.  Neurological: Negative for dizziness, speech difficulty, weakness and headaches.  Hematological: Negative for adenopathy. Does not bruise/bleed easily.  Psychiatric/Behavioral: Negative for sleep disturbance.       See HPI     PE; Blood pressure 105/74, pulse 85, temperature 99.2 F (37.3 C), temperature source Temporal, resp. rate 18, height 5\' 3"  (1.6 m), weight 271 lb (122.925 kg), SpO2 97.00%. Gen: Alert, well appearing, morbidly obese-appearing.  Patient is oriented to person, place, time, and situation. AFFECT: pleasant, lucid thought and speech.  She did not want to speak much but this seemed like her normal personality. ENT: Ears: EACs clear, normal epithelium.  TMs with good light reflex and landmarks bilaterally.  Eyes: no injection, icteris, swelling, or exudate.  EOMI, PERRLA. Nose: no drainage or turbinate edema/swelling.  No injection or focal lesion.  Mouth: lips without lesion/swelling.  Oral mucosa pink and moist.  Dentition intact and without obvious caries or gingival swelling.  Oropharynx without erythema, exudate, or swelling.  Neck: supple/nontender.  No LAD, mass, or TM.  Carotid pulses 2+ bilaterally, without bruits. CV: RRR, no m/r/g.   LUNGS: CTA bilat, nonlabored resps, good aeration in all lung fields. ABD: soft, NT, ND, BS normal.  No hepatospenomegaly or mass.  No bruits. EXT: no clubbing, cyanosis, or edema.  Musculoskeletal: no joint swelling, erythema, warmth, or tenderness.  ROM of all joints intact. Skin - no sores or suspicious lesions or rashes or color changes  Pertinent labs:  Lab Results  Component Value Date   TSH 0.234* 02/18/2013   Lab Results  Component Value Date   WBC 6.0  02/18/2013   HGB 13.8 02/18/2013   HCT 41.3 02/18/2013   MCV 84.4 02/18/2013   PLT 334.0 02/18/2013   Last vit D level 03/2013 was mid 30s after getting some oral replacement for about a month  ASSESSMENT AND PLAN:   Depression I think it is reasonable to start wellbutrin XL 150mg  qd as per her preference stated today (she wants something "wt neutral"). Therapeutic expectations and side effect profile of medication discussed today.  Patient's questions answered. F/u 1 mo.  Hypothyroid Last TSH a little low but no dose adjustment was made. She's overdue for recheck of TSH. This was ordered today to be done with upcoming fasting labs.  Health maintenance examination Reviewed age and gender appropriate health maintenance issues (prudent diet, regular exercise, health risks of tobacco and excessive alcohol, use of seatbelts, fire alarms in home, use of sunscreen).  Also reviewed age and gender appropriate health screening as well as vaccine recommendations. Mammogram ordered today. She will not need pelvic/pap screening anymore in the future. Ordered CMET, TSH, and FLP today (future, when fasting).  Fatigue Suspect due to obesity and deconditioning. However, she is a set-up for OSA.  Her Quality of sleep questionnaire today was a score of 9, which is "high risk" for OSA. However, will hold off on pulm referral at this time, see how she feels as we treat her anxiety and depression, revisit this problem in the future.  Unspecified vitamin D deficiency Hx of. Last recheck shortly after getting started on 2000 IU qd replacement was low normal range. She does want another check today "to see if I have to stay on this med". This was ordered for future when she gets upcoming fasting labs done.  An After Visit Summary was printed and given to the patient.  FOLLOW UP:  Return in about 4 weeks (around 11/02/2013) for f/u anxiety/dep.

## 2013-10-05 NOTE — Assessment & Plan Note (Signed)
Last TSH a little low but no dose adjustment was made. She's overdue for recheck of TSH. This was ordered today to be done with upcoming fasting labs.

## 2013-10-05 NOTE — Assessment & Plan Note (Signed)
Suspect due to obesity and deconditioning. However, she is a set-up for OSA.  Her Quality of sleep questionnaire today was a score of 9, which is "high risk" for OSA. However, will hold off on pulm referral at this time, see how she feels as we treat her anxiety and depression, revisit this problem in the future.

## 2013-10-05 NOTE — Assessment & Plan Note (Signed)
I think it is reasonable to start wellbutrin XL 150mg  qd as per her preference stated today (she wants something "wt neutral"). Therapeutic expectations and side effect profile of medication discussed today.  Patient's questions answered. F/u 1 mo.

## 2013-10-07 LAB — COMPREHENSIVE METABOLIC PANEL
ALK PHOS: 51 U/L (ref 39–117)
ALT: 21 U/L (ref 0–35)
AST: 19 U/L (ref 0–37)
Albumin: 4.1 g/dL (ref 3.5–5.2)
BUN: 12 mg/dL (ref 6–23)
CHLORIDE: 105 meq/L (ref 96–112)
CO2: 28 mEq/L (ref 19–32)
CREATININE: 0.68 mg/dL (ref 0.50–1.10)
Calcium: 9 mg/dL (ref 8.4–10.5)
Glucose, Bld: 92 mg/dL (ref 70–99)
Potassium: 4 mEq/L (ref 3.5–5.3)
Sodium: 141 mEq/L (ref 135–145)
Total Bilirubin: 0.6 mg/dL (ref 0.2–1.2)
Total Protein: 6.7 g/dL (ref 6.0–8.3)

## 2013-10-07 LAB — TSH: TSH: 1.731 u[IU]/mL (ref 0.350–4.500)

## 2013-10-07 LAB — LIPID PANEL
Cholesterol: 157 mg/dL (ref 0–200)
HDL: 37 mg/dL — AB (ref >39)
LDL CALC: 76 mg/dL (ref 0–99)
Total CHOL/HDL Ratio: 4.2 Ratio
Triglycerides: 220 mg/dL — ABNORMAL HIGH (ref <150)
VLDL: 44 mg/dL — AB (ref 0–40)

## 2013-10-08 LAB — VITAMIN D 25 HYDROXY (VIT D DEFICIENCY, FRACTURES): Vit D, 25-Hydroxy: 59 ng/mL (ref 30–89)

## 2013-10-10 ENCOUNTER — Encounter: Payer: Self-pay | Admitting: Family Medicine

## 2013-10-31 ENCOUNTER — Other Ambulatory Visit: Payer: Self-pay | Admitting: Family Medicine

## 2013-10-31 DIAGNOSIS — E039 Hypothyroidism, unspecified: Secondary | ICD-10-CM

## 2013-10-31 MED ORDER — LEVOTHYROXINE SODIUM 150 MCG PO TABS: 150.0000 ug | ORAL_TABLET | Freq: Every day | ORAL | Status: DC

## 2013-11-02 ENCOUNTER — Ambulatory Visit: Payer: BC Managed Care – PPO | Admitting: Family Medicine

## 2013-11-24 ENCOUNTER — Encounter: Payer: Self-pay | Admitting: Family Medicine

## 2013-11-24 ENCOUNTER — Ambulatory Visit (INDEPENDENT_AMBULATORY_CARE_PROVIDER_SITE_OTHER): Payer: BC Managed Care – PPO | Admitting: Family Medicine

## 2013-11-24 VITALS — BP 121/81 | HR 65 | Temp 98.8°F | Resp 18 | Ht 63.0 in | Wt 272.0 lb

## 2013-11-24 DIAGNOSIS — F3289 Other specified depressive episodes: Secondary | ICD-10-CM

## 2013-11-24 DIAGNOSIS — F32A Depression, unspecified: Secondary | ICD-10-CM

## 2013-11-24 DIAGNOSIS — F329 Major depressive disorder, single episode, unspecified: Secondary | ICD-10-CM

## 2013-11-24 MED ORDER — BUPROPION HCL ER (XL) 300 MG PO TB24: 300.0000 mg | ORAL_TABLET | Freq: Every day | ORAL | Status: DC

## 2013-11-24 NOTE — Assessment & Plan Note (Signed)
Mild improvement. Discussed options with pt today and we decided to go ahead and push her dose to 300mg  XL bupropion qd. Therapeutic expectations and side effect profile of medication discussed today.  Patient's questions answered.

## 2013-11-24 NOTE — Progress Notes (Signed)
OFFICE NOTE  11/24/2013  CC:  Chief Complaint  Patient presents with  . Follow-up     HPI: Patient is a 53 y.o. Caucasian female who is here for 1 mo f/u depression and generalized anxiety. I started her on wellbutrin XL 150mg  qd last visit.   Feels a bit improved, less crying spells. Energy level up just a bit, sleep still varies, appetite unchanged. She denies any adverse side effects from the new med.  ROS: 2 wks of gradually resolving productive cough, without wheezing or sob or fevers.  Pertinent PMH:  Past medical, surgical, social, and family history reviewed and no changes are noted since last office visit.  MEDS:  Outpatient Prescriptions Prior to Visit  Medication Sig Dispense Refill  . buPROPion (WELLBUTRIN XL) 150 MG 24 hr tablet Take 1 tablet (150 mg total) by mouth daily.  30 tablet  1  . cholecalciferol (VITAMIN D) 1000 UNITS tablet Take 2,000 Units by mouth daily.      Marland Kitchen. gabapentin (NEURONTIN) 100 MG capsule Take 100 mg by mouth 3 (three) times daily.      Marland Kitchen. levothyroxine (SYNTHROID, LEVOTHROID) 150 MCG tablet Take 1 tablet (150 mcg total) by mouth daily.  30 tablet  3  . albuterol (PROVENTIL HFA;VENTOLIN HFA) 108 (90 BASE) MCG/ACT inhaler Inhale 2 puffs into the lungs every 4 (four) hours as needed for wheezing.  1 Inhaler  2  . albuterol (PROVENTIL) (2.5 MG/3ML) 0.083% nebulizer solution Take 3 mLs (2.5 mg total) by nebulization every 6 (six) hours as needed for wheezing.  75 mL  12  . beclomethasone (QVAR) 40 MCG/ACT inhaler Inhale 1 puff into the lungs 2 (two) times daily.  1 Inhaler  0   No facility-administered medications prior to visit.    PE: Blood pressure 121/81, pulse 65, temperature 98.8 F (37.1 C), temperature source Temporal, resp. rate 18, height 5\' 3"  (1.6 m), weight 272 lb (123.378 kg), SpO2 96.00%. Gen: Alert, well appearing.  Patient is oriented to person, place, time, and situation. AFFECT: pleasant, lucid thought and speech. WUJ:WJXBENT:Eyes: no  injection, icteris, swelling, or exudate.  EOMI, PERRLA. Mouth: lips without lesion/swelling.  Oral mucosa pink and moist. Oropharynx without erythema, exudate, or swelling.  CV: RRR, no m/r/g.   LUNGS: CTA bilat, nonlabored resps, good aeration in all lung fields.   IMPRESSION AND PLAN:  Depression Mild improvement. Discussed options with pt today and we decided to go ahead and push her dose to 300mg  XL bupropion qd. Therapeutic expectations and side effect profile of medication discussed today.  Patient's questions answered.   Resolving acute bronchitis.  Reassured pt no further treatment needed at this time.  An After Visit Summary was printed and given to the patient.  FOLLOW UP: 6-8 wks f/u mood d/o

## 2013-11-24 NOTE — Progress Notes (Signed)
Pre visit review using our clinic review tool, if applicable. No additional management support is needed unless otherwise documented below in the visit note. 

## 2013-11-30 ENCOUNTER — Ambulatory Visit (INDEPENDENT_AMBULATORY_CARE_PROVIDER_SITE_OTHER): Payer: BC Managed Care – PPO | Admitting: Family Medicine

## 2013-11-30 DIAGNOSIS — Z111 Encounter for screening for respiratory tuberculosis: Secondary | ICD-10-CM

## 2013-12-02 ENCOUNTER — Ambulatory Visit (INDEPENDENT_AMBULATORY_CARE_PROVIDER_SITE_OTHER): Payer: BC Managed Care – PPO | Admitting: Family Medicine

## 2013-12-02 DIAGNOSIS — Z111 Encounter for screening for respiratory tuberculosis: Secondary | ICD-10-CM

## 2013-12-02 LAB — TB SKIN TEST
INDURATION: 0 mm
TB Skin Test: NEGATIVE

## 2014-01-10 ENCOUNTER — Encounter: Payer: Self-pay | Admitting: Family Medicine

## 2014-01-10 ENCOUNTER — Ambulatory Visit (INDEPENDENT_AMBULATORY_CARE_PROVIDER_SITE_OTHER): Payer: BC Managed Care – PPO | Admitting: Family Medicine

## 2014-01-10 VITALS — BP 124/85 | HR 80 | Temp 99.0°F | Resp 18 | Ht 63.0 in | Wt 270.0 lb

## 2014-01-10 DIAGNOSIS — F411 Generalized anxiety disorder: Secondary | ICD-10-CM

## 2014-01-10 DIAGNOSIS — F3289 Other specified depressive episodes: Secondary | ICD-10-CM

## 2014-01-10 DIAGNOSIS — F329 Major depressive disorder, single episode, unspecified: Secondary | ICD-10-CM

## 2014-01-10 DIAGNOSIS — F32A Depression, unspecified: Secondary | ICD-10-CM

## 2014-01-10 MED ORDER — BUPROPION HCL ER (XL) 300 MG PO TB24: 300.0000 mg | ORAL_TABLET | Freq: Every day | ORAL | Status: DC

## 2014-01-10 NOTE — Progress Notes (Signed)
OFFICE NOTE  01/10/2014  CC:  Chief Complaint  Patient presents with  . Follow-up     HPI: Patient is a 53 y.o. Caucasian female who is here for 6 wk f/u depression and generalized anxiety. Improved quite a bit, satisfied with med effect.  No side effect.   Pertinent PMH:  Past Medical History  Diagnosis Date  . High triglycerides   . Hypothyroidism   . Hypertension   . Allergy   . Asthma   . Neuromuscular disorder   . Herniated disc L4-L5  . Sprain of posterior cruciate ligament of knee right knee  . Internal hemorrhoids   . IBS (irritable bowel syndrome)   . Neuropathic pain     Rx'd neurontin by her orthopedist   Past Surgical History  Procedure Laterality Date  . Total abdominal hysterectomy w/ bilateral salpingoophorectomy  approx 2006    Nonmalignant reasons  . Colonoscopy w/ polypectomy  09/2012    Dr. Loreta AveMann: diverticulosis.  Recall 5 yrs.   History   Social History Narrative   Married 26 yrs, 3 children (1 daught, 2 sons).   [place]   Occupation: Works for [employer], does medication mgmt evals in NH/ALF's.   No tobacco, very rare alcohol, no drugs.             MEDS:  Outpatient Prescriptions Prior to Visit  Medication Sig Dispense Refill  . buPROPion (WELLBUTRIN XL) 300 MG 24 hr tablet Take 1 tablet (300 mg total) by mouth daily.  30 tablet  2  . gabapentin (NEURONTIN) 100 MG capsule Take 100 mg by mouth 3 (three) times daily.      Marland Kitchen. levothyroxine (SYNTHROID, LEVOTHROID) 150 MCG tablet Take 1 tablet (150 mcg total) by mouth daily.  30 tablet  3  . albuterol (PROVENTIL HFA;VENTOLIN HFA) 108 (90 BASE) MCG/ACT inhaler Inhale 2 puffs into the lungs every 4 (four) hours as needed for wheezing.  1 Inhaler  2  . albuterol (PROVENTIL) (2.5 MG/3ML) 0.083% nebulizer solution Take 3 mLs (2.5 mg total) by nebulization every 6 (six) hours as needed for wheezing.  75 mL  12  . beclomethasone (QVAR) 40 MCG/ACT inhaler Inhale 1 puff into the  lungs 2 (two) times daily.  1 Inhaler  0  . cholecalciferol (VITAMIN D) 1000 UNITS tablet Take 2,000 Units by mouth daily.       No facility-administered medications prior to visit.    PE: Blood pressure 124/85, pulse 80, temperature 99 F (37.2 C), temperature source Temporal, resp. rate 18, height 5\' 3"  (1.6 m), weight 270 lb (122.471 kg), SpO2 95.00%. Wt Readings from Last 2 Encounters:  01/10/14 270 lb (122.471 kg)  11/24/13 272 lb (123.378 kg)    Gen: alert, oriented x 4, affect pleasant.  Lucid thinking and conversation noted. HEENT: PERRLA, EOMI.   Neck: no LAD, mass, or thyromegaly. CV: RRR, no m/r/g LUNGS: CTA bilat, nonlabored. NEURO: no tremor or tics noted on observation.  Coordination intact. CN 2-12 grossly intact bilaterally, strength 5/5 in all extremeties.  No ataxia.   IMPRESSION AND PLAN:  Depression/anxiety: improved. Continue wellbutrin XL 300mg  qd.  FOLLOW UP: 6 mo

## 2014-01-10 NOTE — Progress Notes (Signed)
Pre visit review using our clinic review tool, if applicable. No additional management support is needed unless otherwise documented below in the visit note. 

## 2014-03-04 ENCOUNTER — Other Ambulatory Visit: Payer: Self-pay | Admitting: Family Medicine

## 2014-05-06 ENCOUNTER — Other Ambulatory Visit: Payer: Self-pay | Admitting: Family Medicine

## 2014-07-03 ENCOUNTER — Other Ambulatory Visit: Payer: Self-pay | Admitting: *Deleted

## 2014-07-03 MED ORDER — LEVOTHYROXINE SODIUM 150 MCG PO TABS: ORAL_TABLET | ORAL | Status: DC

## 2014-07-12 ENCOUNTER — Ambulatory Visit (INDEPENDENT_AMBULATORY_CARE_PROVIDER_SITE_OTHER): Payer: BC Managed Care – PPO | Admitting: Family Medicine

## 2014-07-12 ENCOUNTER — Encounter: Payer: Self-pay | Admitting: Family Medicine

## 2014-07-12 VITALS — BP 130/89 | HR 96 | Temp 98.8°F | Resp 18 | Ht 63.0 in | Wt 280.0 lb

## 2014-07-12 DIAGNOSIS — Z23 Encounter for immunization: Secondary | ICD-10-CM

## 2014-07-12 DIAGNOSIS — F411 Generalized anxiety disorder: Secondary | ICD-10-CM

## 2014-07-12 DIAGNOSIS — G4710 Hypersomnia, unspecified: Secondary | ICD-10-CM

## 2014-07-12 DIAGNOSIS — E039 Hypothyroidism, unspecified: Secondary | ICD-10-CM

## 2014-07-12 DIAGNOSIS — F329 Major depressive disorder, single episode, unspecified: Secondary | ICD-10-CM

## 2014-07-12 DIAGNOSIS — F32A Depression, unspecified: Secondary | ICD-10-CM

## 2014-07-12 NOTE — Progress Notes (Signed)
OFFICE NOTE  07/12/2014  CC:  Chief Complaint  Patient presents with  . Follow-up    fasting     HPI: Patient is a 53 y.o. Caucasian female who is here for 6 mo f/u hypothyroidism and depression/anxiety. Of note: she is no longer on any preventative inhalers for her asthma, hasn't required rescue albuterol in 2 yrs. Taking wellbutrin and feeling stable from mood/anxiety standpoint. She reminds me she has snoring and hx of witnessed apnea in sleep and hx of daytime somnolence/fatigue. Asks about home sleep study.   Pertinent PMH:  Past medical, surgical, social, and family history reviewed and no changes are noted since last office visit.  MEDS: Not on inhalers or vit D listed below. Outpatient Prescriptions Prior to Visit  Medication Sig Dispense Refill  . buPROPion (WELLBUTRIN XL) 300 MG 24 hr tablet Take 1 tablet (300 mg total) by mouth daily. 90 tablet 2  . gabapentin (NEURONTIN) 100 MG capsule Take 100 mg by mouth 3 (three) times daily.    Marland Kitchen. levothyroxine (SYNTHROID, LEVOTHROID) 150 MCG tablet TAKE 1 TABLET BY MOUTH EVERY DAY 30 tablet 3  . albuterol (PROVENTIL HFA;VENTOLIN HFA) 108 (90 BASE) MCG/ACT inhaler Inhale 2 puffs into the lungs every 4 (four) hours as needed for wheezing. 1 Inhaler 2  . albuterol (PROVENTIL) (2.5 MG/3ML) 0.083% nebulizer solution Take 3 mLs (2.5 mg total) by nebulization every 6 (six) hours as needed for wheezing. 75 mL 12  . beclomethasone (QVAR) 40 MCG/ACT inhaler Inhale 1 puff into the lungs 2 (two) times daily. 1 Inhaler 0  . cholecalciferol (VITAMIN D) 1000 UNITS tablet Take 2,000 Units by mouth daily.     No facility-administered medications prior to visit.    PE: Blood pressure 130/89, pulse 96, temperature 98.8 F (37.1 C), temperature source Temporal, resp. rate 18, height 5\' 3"  (1.6 m), weight 280 lb (127.007 kg), SpO2 96 %. Gen: Alert, well appearing.  Patient is oriented to person, place, time, and situation. AFFECT: pleasant, lucid  thought and speech. No further exam.  IMPRESSION AND PLAN:  1) Hypothyroidism: The current medical regimen is effective;  continue present plan and medications.  2) Depression/anxiety: The current medical regimen is effective;  continue present plan and medications.  3) Asthma: quiescent--no inhalers at this time.  4) Excessive daytime somnolence, suspicion of OSA. Will order home sleep study.  An After Visit Summary was printed and given to the patient.  FOLLOW UP: 3 mo for CPE, HP labs + vit D level the week prior.

## 2014-07-12 NOTE — Progress Notes (Signed)
Pre visit review using our clinic review tool, if applicable. No additional management support is needed unless otherwise documented below in the visit note. 

## 2014-07-13 ENCOUNTER — Ambulatory Visit: Payer: BC Managed Care – PPO | Admitting: Family Medicine

## 2014-07-18 ENCOUNTER — Ambulatory Visit (INDEPENDENT_AMBULATORY_CARE_PROVIDER_SITE_OTHER): Payer: BC Managed Care – PPO | Admitting: Family Medicine

## 2014-07-18 ENCOUNTER — Encounter: Payer: Self-pay | Admitting: Family Medicine

## 2014-07-18 VITALS — BP 121/75 | HR 71 | Temp 98.3°F | Ht 63.0 in | Wt 280.0 lb

## 2014-07-18 DIAGNOSIS — J111 Influenza due to unidentified influenza virus with other respiratory manifestations: Secondary | ICD-10-CM

## 2014-07-18 DIAGNOSIS — R69 Illness, unspecified: Principal | ICD-10-CM

## 2014-07-18 MED ORDER — HYDROCODONE-HOMATROPINE 5-1.5 MG/5ML PO SYRP: ORAL_SOLUTION | ORAL | Status: DC

## 2014-07-18 NOTE — Progress Notes (Signed)
OFFICE NOTE  07/18/2014  CC:  Chief Complaint  Patient presents with  . Sore Throat    bodyaches  . Headache   HPI: Patient is a 53 y.o. Caucasian female who is here for respiratory symptoms. Onset about 72h ago, ST, followed later by lots of nasal congestion that was thick and green, with HA.  Lots of coughing has been occurring as well.  Subjective f/c, but no temperature checked at home. Diffuse body aches, malaise.  No n/v/d.  She is not a smoker.  No wheezing or SOB.  Pertinent PMH:  Past medical, surgical, social, and family history reviewed and no changes are noted since last office visit.  MEDS:  Outpatient Prescriptions Prior to Visit  Medication Sig Dispense Refill  . buPROPion (WELLBUTRIN XL) 300 MG 24 hr tablet Take 1 tablet (300 mg total) by mouth daily. 90 tablet 2  . gabapentin (NEURONTIN) 100 MG capsule Take 100 mg by mouth 3 (three) times daily.    Marland Kitchen. levothyroxine (SYNTHROID, LEVOTHROID) 150 MCG tablet TAKE 1 TABLET BY MOUTH EVERY DAY 30 tablet 3  . albuterol (PROVENTIL HFA;VENTOLIN HFA) 108 (90 BASE) MCG/ACT inhaler Inhale 2 puffs into the lungs every 4 (four) hours as needed for wheezing. 1 Inhaler 2  . beclomethasone (QVAR) 40 MCG/ACT inhaler Inhale 1 puff into the lungs 2 (two) times daily. 1 Inhaler 0   No facility-administered medications prior to visit.    PE: Blood pressure 121/75, pulse 71, temperature 98.3 F (36.8 C), temperature source Oral, height 5\' 3"  (1.6 m), weight 280 lb (127.007 kg), SpO2 93 %. VS: noted--normal. Gen: alert, NAD, NONTOXIC APPEARING. HEENT: eyes without injection, drainage, or swelling.  Ears: EACs clear, TMs with normal light reflex and landmarks.  Nose: Clear rhinorrhea, with some dried, crusty exudate adherent to mildly injected mucosa.  No purulent d/c.  No paranasal sinus TTP.  No facial swelling.  Throat and mouth without focal lesion.  No pharyngial swelling, erythema, or exudate.   Neck: supple, no LAD.   LUNGS: CTA  bilat, nonlabored resps.   CV: RRR, no m/r/g. EXT: no c/c/e SKIN: no rash    IMPRESSION AND PLAN:  Influenza-like illness, > 72 hrs since onset of sx's. She got flu vaccine less than a week ago. Continue symptomatic care with decong/antihist, antipyretic prn, try mucinex DM qAM and I gave rx for hycodan syrup 1 tsp qhs prn, #120 ml. Signs/symptoms to call or return for were reviewed and pt expressed understanding.   An After Visit Summary was printed and given to the patient.  FOLLOW UP: prn

## 2014-07-18 NOTE — Patient Instructions (Signed)
Try OTC mucinex DM. Drink frequent sips of clear fluids. Rest as much as possible.

## 2014-07-18 NOTE — Progress Notes (Signed)
Pre visit review using our clinic review tool, if applicable. No additional management support is needed unless otherwise documented below in the visit note. 

## 2014-08-10 ENCOUNTER — Encounter: Payer: Self-pay | Admitting: Family Medicine

## 2014-08-10 DIAGNOSIS — G4733 Obstructive sleep apnea (adult) (pediatric): Secondary | ICD-10-CM | POA: Insufficient documentation

## 2014-10-11 ENCOUNTER — Other Ambulatory Visit: Payer: BC Managed Care – PPO

## 2014-10-18 ENCOUNTER — Encounter: Payer: BC Managed Care – PPO | Admitting: Family Medicine

## 2014-10-27 ENCOUNTER — Ambulatory Visit (INDEPENDENT_AMBULATORY_CARE_PROVIDER_SITE_OTHER): Payer: Self-pay | Admitting: Family Medicine

## 2014-10-27 DIAGNOSIS — Z111 Encounter for screening for respiratory tuberculosis: Secondary | ICD-10-CM

## 2014-10-30 ENCOUNTER — Telehealth: Payer: Self-pay | Admitting: Family Medicine

## 2014-10-30 ENCOUNTER — Ambulatory Visit: Payer: Self-pay

## 2014-10-30 LAB — TB SKIN TEST
Induration: 0 mm
TB Skin Test: NEGATIVE

## 2014-10-30 NOTE — Telephone Encounter (Signed)
Done

## 2014-10-30 NOTE — Telephone Encounter (Signed)
Please print corrected TB result note, the note she got this morning has a TB skin test date of 12/02/13. She will come by to pick it up later.

## 2014-12-18 ENCOUNTER — Other Ambulatory Visit: Payer: Self-pay | Admitting: *Deleted

## 2014-12-18 MED ORDER — LEVOTHYROXINE SODIUM 150 MCG PO TABS: ORAL_TABLET | ORAL | Status: DC

## 2014-12-21 ENCOUNTER — Encounter: Payer: Self-pay | Admitting: Family Medicine

## 2014-12-21 ENCOUNTER — Ambulatory Visit (INDEPENDENT_AMBULATORY_CARE_PROVIDER_SITE_OTHER): Payer: 59 | Admitting: Family Medicine

## 2014-12-21 VITALS — BP 104/76 | HR 67 | Temp 98.1°F | Ht 63.0 in | Wt 288.0 lb

## 2014-12-21 DIAGNOSIS — Z1239 Encounter for other screening for malignant neoplasm of breast: Secondary | ICD-10-CM

## 2014-12-21 DIAGNOSIS — Z0000 Encounter for general adult medical examination without abnormal findings: Secondary | ICD-10-CM

## 2014-12-21 DIAGNOSIS — Z114 Encounter for screening for human immunodeficiency virus [HIV]: Secondary | ICD-10-CM

## 2014-12-21 LAB — CBC WITH DIFFERENTIAL/PLATELET
Basophils Absolute: 0 10*3/uL (ref 0.0–0.1)
Basophils Relative: 0.4 % (ref 0.0–3.0)
EOS ABS: 0.2 10*3/uL (ref 0.0–0.7)
EOS PCT: 3.8 % (ref 0.0–5.0)
HEMATOCRIT: 39.7 % (ref 36.0–46.0)
Hemoglobin: 13.3 g/dL (ref 12.0–15.0)
LYMPHS ABS: 1.4 10*3/uL (ref 0.7–4.0)
Lymphocytes Relative: 27.1 % (ref 12.0–46.0)
MCHC: 33.5 g/dL (ref 30.0–36.0)
MCV: 83.4 fl (ref 78.0–100.0)
MONO ABS: 0.4 10*3/uL (ref 0.1–1.0)
Monocytes Relative: 7.7 % (ref 3.0–12.0)
Neutro Abs: 3.1 10*3/uL (ref 1.4–7.7)
Neutrophils Relative %: 61 % (ref 43.0–77.0)
Platelets: 305 10*3/uL (ref 150.0–400.0)
RBC: 4.76 Mil/uL (ref 3.87–5.11)
RDW: 14.5 % (ref 11.5–15.5)
WBC: 5.1 10*3/uL (ref 4.0–10.5)

## 2014-12-21 LAB — COMPREHENSIVE METABOLIC PANEL
ALBUMIN: 3.9 g/dL (ref 3.5–5.2)
ALT: 23 U/L (ref 0–35)
AST: 17 U/L (ref 0–37)
Alkaline Phosphatase: 54 U/L (ref 39–117)
BUN: 13 mg/dL (ref 6–23)
CHLORIDE: 107 meq/L (ref 96–112)
CO2: 27 meq/L (ref 19–32)
CREATININE: 0.74 mg/dL (ref 0.40–1.20)
Calcium: 9.2 mg/dL (ref 8.4–10.5)
GFR: 87.28 mL/min (ref >60.00)
GLUCOSE: 94 mg/dL (ref 70–99)
POTASSIUM: 4 meq/L (ref 3.5–5.1)
Sodium: 140 mEq/L (ref 135–145)
Total Bilirubin: 0.6 mg/dL (ref 0.2–1.2)
Total Protein: 6.6 g/dL (ref 6.0–8.3)

## 2014-12-21 LAB — LIPID PANEL
CHOL/HDL RATIO: 5
CHOLESTEROL: 195 mg/dL (ref 0–200)
HDL: 40.7 mg/dL (ref >39.00)
NONHDL: 154.3
Triglycerides: 231 mg/dL — ABNORMAL HIGH (ref 0.0–149.0)
VLDL: 46.2 mg/dL — ABNORMAL HIGH (ref 0.0–40.0)

## 2014-12-21 LAB — LDL CHOLESTEROL, DIRECT: Direct LDL: 54 mg/dL

## 2014-12-21 LAB — TSH: TSH: 2.63 u[IU]/mL (ref 0.35–4.50)

## 2014-12-21 MED ORDER — BUPROPION HCL ER (XL) 300 MG PO TB24: 300.0000 mg | ORAL_TABLET | Freq: Every day | ORAL | Status: DC

## 2014-12-21 MED ORDER — GABAPENTIN 100 MG PO CAPS: 100.0000 mg | ORAL_CAPSULE | Freq: Three times a day (TID) | ORAL | Status: DC

## 2014-12-21 NOTE — Addendum Note (Signed)
Addended by: Marlene LardMILLER, Obie Silos M on: 12/21/2014 10:39 AM   Modules accepted: Kipp BroodSmartSet

## 2014-12-21 NOTE — Progress Notes (Signed)
Pre visit review using our clinic review tool, if applicable. No additional management support is needed unless otherwise documented below in the visit note. 

## 2014-12-21 NOTE — Progress Notes (Signed)
Office Note 12/21/2014  CC:  Chief Complaint  Patient presents with  . Annual Exam    HPI:  Brenda Schroeder is a 54 y.o. White female who is here for CPE. Due to insurance issues she has not been compliant. She never got her home sleep study I ordered 07/2014. Just started wellbutrin 300 a few days ago--it was rx'd quite a while ago.  Compliant with L4 for the most part (occ misses a dose but takes the med correctly).  No acute complaints.   Past Medical History  Diagnosis Date  . High triglycerides   . Hypothyroidism   . Hypertension   . Allergy   . Asthma   . Neuromuscular disorder   . Herniated disc L4-L5  . Sprain of posterior cruciate ligament of knee right knee  . Internal hemorrhoids   . IBS (irritable bowel syndrome)   . Neuropathic pain     Rx'd neurontin by her orthopedist    Past Surgical History  Procedure Laterality Date  . Total abdominal hysterectomy w/ bilateral salpingoophorectomy  approx 2006    Nonmalignant reasons  . Colonoscopy w/ polypectomy  09/2012    Dr. Loreta Ave: diverticulosis.  Recall 5 yrs.    Family History  Problem Relation Age of Onset  . Arthritis Mother     osteo arithritis  . Obesity Mother   . Fibroids Mother     uterine  . Lymphoma Father   . Sarcoidosis Father   . Pulmonary fibrosis Father   . Arthritis Maternal Grandmother     rheumatoid  . Cancer Maternal Grandmother     cervical  . Heart disease Maternal Grandfather     a-fib  . COPD Maternal Grandfather     emphysema  . Vision loss Paternal Grandmother   . Emphysema Paternal Grandfather   . Lung cancer Paternal Grandfather   . Celiac disease Sister   . Asthma Son   . Cancer Daughter     ovarian  . Obesity Sister     History   Social History  . Marital Status: Married    Spouse Name: N/A  . Number of Children: N/A  . Years of Education: N/A   Occupational History  . Not on file.   Social History Main Topics  . Smoking status: Never Smoker   .  Smokeless tobacco: Not on file  . Alcohol Use: Not on file     Comment: rarely  . Drug Use: Not on file  . Sexual Activity:    Partners: Male    Pharmacist, hospital Protection: None   Other Topics Concern  . Not on file   Social History Narrative   Married 26 yrs, 3 children (1 daught, 2 sons).   [place] from [place].   Occupation: Works for [employer], does medication mgmt evals in NH/ALF's.   No tobacco, very rare alcohol, no drugs.             Outpatient Prescriptions Prior to Visit  Medication Sig Dispense Refill  . levothyroxine (SYNTHROID, LEVOTHROID) 150 MCG tablet TAKE 1 TABLET BY MOUTH EVERY DAY 30 tablet 3  . buPROPion (WELLBUTRIN XL) 300 MG 24 hr tablet Take 1 tablet (300 mg total) by mouth daily. 90 tablet 2  . gabapentin (NEURONTIN) 100 MG capsule Take 100 mg by mouth 3 (three) times daily.    Marland Kitchen HYDROcodone-homatropine (HYCODAN) 5-1.5 MG/5ML syrup 1-2 tsp po qhs prn cough 120 mL 0   No facility-administered medications prior to visit.  Allergies  Allergen Reactions  . Nsaids Anaphylaxis  . Aspirin     Short of breath, nasal congestion  . Sulfa Antibiotics Rash    ROS Review of Systems  Constitutional: Negative for fever, chills, appetite change and fatigue.  HENT: Negative for congestion, dental problem, ear pain and sore throat.   Eyes: Negative for discharge, redness and visual disturbance.  Respiratory: Negative for cough, chest tightness, shortness of breath and wheezing.   Cardiovascular: Negative for chest pain, palpitations and leg swelling.  Gastrointestinal: Negative for nausea, vomiting, abdominal pain, diarrhea and blood in stool.  Genitourinary: Negative for dysuria, urgency, frequency, hematuria, flank pain and difficulty urinating.  Musculoskeletal: Negative for myalgias, back pain, joint swelling, arthralgias and neck stiffness.       Some intermittent R heel pain; worse in AM, better as day goes on.  Skin: Negative for pallor and  rash.  Neurological: Negative for dizziness, speech difficulty, weakness and headaches.  Hematological: Negative for adenopathy. Does not bruise/bleed easily.  Psychiatric/Behavioral: Negative for confusion and sleep disturbance. The patient is not nervous/anxious.     PE; Blood pressure 104/76, pulse 67, temperature 98.1 F (36.7 C), temperature source Oral, height 5\' 3"  (1.6 m), weight 288 lb (130.636 kg), SpO2 97 %. Gen: Alert, well appearing, obese WF in NAD.  Patient is oriented to person, place, time, and situation. AFFECT: pleasant, lucid thought and speech. ENT: Ears: EACs clear, normal epithelium.  TMs with good light reflex and landmarks bilaterally.  Eyes: no injection, icteris, swelling, or exudate.  EOMI, PERRLA. Nose: no drainage or turbinate edema/swelling.  No injection or focal lesion.  Mouth: lips without lesion/swelling.  Oral mucosa pink and moist.  Dentition intact and without obvious caries or gingival swelling.  Oropharynx without erythema, exudate, or swelling.  Neck: supple/nontender.  No LAD, mass, or TM.  Carotid pulses 2+ bilaterally, without bruits. CV: RRR, no m/r/g.   LUNGS: CTA bilat, nonlabored resps, good aeration in all lung fields. ABD: soft, NT, ND, BS normal.  No hepatospenomegaly or mass.  No bruits. EXT: no clubbing, cyanosis, or edema.  Musculoskeletal: no joint swelling, erythema, warmth, or tenderness.  ROM of all joints intact. Skin - no sores or suspicious lesions or rashes or color changes   Pertinent labs:  Lab Results  Component Value Date   TSH 1.731 10/05/2013   Lab Results  Component Value Date   WBC 6.0 02/18/2013   HGB 13.8 02/18/2013   HCT 41.3 02/18/2013   MCV 84.4 02/18/2013   PLT 334.0 02/18/2013    Lab Results  Component Value Date   ALT 21 10/05/2013   AST 19 10/05/2013   ALKPHOS 51 10/05/2013   BILITOT 0.6 10/05/2013   Lab Results  Component Value Date   CHOL 157 10/05/2013   Lab Results  Component Value Date    HDL 37* 10/05/2013   Lab Results  Component Value Date   LDLCALC 76 10/05/2013   Lab Results  Component Value Date   TRIG 220* 10/05/2013   Lab Results  Component Value Date   CHOLHDL 4.2 10/05/2013    ASSESSMENT AND PLAN:   Health maintenance exam: Reviewed age and gender appropriate health maintenance issues (prudent diet, regular exercise, health risks of tobacco and excessive alcohol, use of seatbelts, fire alarms in home, use of sunscreen).  Also reviewed age and gender appropriate health screening as well as vaccine recommendations. HP labs drawn today plus HIV screening. Mammogram re-ordered (ordered 09/2013 but she never got it).  Not a candidate for cerv ca screening due to having hysterectomy for benign reasons. Colon ca screening UTD: next colonoscopy 2019. Will get her home sleep study arranged that I had ordered back in 07/2014.  An After Visit Summary was printed and given to the patient.  FOLLOW UP:  Return in about 6 months (around 06/22/2015) for f/u depr/hypoth.

## 2014-12-22 LAB — HIV ANTIBODY (ROUTINE TESTING W REFLEX): HIV: NONREACTIVE

## 2015-01-23 ENCOUNTER — Telehealth: Payer: Self-pay | Admitting: Family Medicine

## 2015-01-23 DIAGNOSIS — G4733 Obstructive sleep apnea (adult) (pediatric): Secondary | ICD-10-CM

## 2015-01-23 NOTE — Telephone Encounter (Signed)
Left message for pt to call back  °

## 2015-01-23 NOTE — Telephone Encounter (Signed)
Pls call pt and tell her that her home sleep study showed severe obstuctive sleep apnea and her oxygen level is dropping while she sleeps. I recommend she see a [facility], [facility], so they can make recommendations on CPAP titration and further oxygen monitoring while getting CPAP titrated, etc. I'll order referral.  Pls fax sleep study report to [facility] and then put it in the pile to be scanned into EMR.-thx

## 2015-01-23 NOTE — Telephone Encounter (Signed)
Advised patient

## 2015-01-23 NOTE — Telephone Encounter (Signed)
Patient has an appointment with [facility] Craige [facility]ood [facility] 03/23/15. Patient is concerned about her oxygen levels being so low since the appt isn't for a month.

## 2015-01-24 NOTE — Telephone Encounter (Signed)
Pt advised and voiced understanding. Pt requested copy of sleep study but the copy we had has already been sent off for scanning. Pt advised.

## 2015-01-24 NOTE — Telephone Encounter (Signed)
Reassure her that this has been likely going on for years, and even though it is dropping down it is not to a level that anything immediate needs to be done about.  Reassure pt-ok to wait.-thx

## 2015-01-29 ENCOUNTER — Telehealth: Payer: Self-pay | Admitting: Family Medicine

## 2015-01-29 NOTE — Telephone Encounter (Signed)
Pls call pt and tell her we'll have her CPAP titration and overnight oxygen testing done through [facility] if she is ok with that. This will be the plan INSTEAD of waiting to go see the pulmonologist. Let me know if she's ok with doing it this way, otherwise we'll leave the pulmonary consult as-is.-thx

## 2015-01-29 NOTE — Telephone Encounter (Signed)
Left message for pt to call back  °

## 2015-02-01 NOTE — Telephone Encounter (Signed)
She can cancel the pulm appt. We can always re-arrange an appt with pulmonology if needed in the future.-thx

## 2015-02-01 NOTE — Telephone Encounter (Signed)
Left message to call back  

## 2015-02-01 NOTE — Telephone Encounter (Signed)
Spoke to pt and she stated that she would like to have this done through [facility]. She wants to know if she should keep her apt with pulmonary on 02/21/15 at 2:00pm. Please advise. Thanks.

## 2015-02-01 NOTE — Telephone Encounter (Signed)
Pt advised and voiced understanding.   

## 2015-02-16 ENCOUNTER — Encounter: Payer: Self-pay | Admitting: Family Medicine

## 2015-03-23 ENCOUNTER — Institutional Professional Consult (permissible substitution): Payer: 59 | Admitting: Pulmonary Disease

## 2015-05-22 ENCOUNTER — Telehealth: Payer: Self-pay | Admitting: *Deleted

## 2015-05-22 NOTE — Telephone Encounter (Signed)
Left message for pt to call back  °

## 2015-05-22 NOTE — Telephone Encounter (Signed)
Pt called back and stated that she has already had sleep study and overnight oximetry done. She stated that she is waiting on the results from the disk before she calls [facility]. I advised pt that I am unsure what "disk" she is referring to but I will send back a message to Dr. Milinda Cave. Please advise. Thanks.

## 2015-05-22 NOTE — Telephone Encounter (Signed)
Received fax from [facility] stating that they have tried to contact pt but have been unable to reach pt to complete Dr. Samul Dada order. They are going to put order on hold until they hear from Dr. Milinda Cave.

## 2015-05-22 NOTE — Telephone Encounter (Signed)
Per Dr. Milinda Cave please contact pt and tell her to get this set up with [facility].

## 2015-05-23 NOTE — Telephone Encounter (Signed)
I don't know what she is referring to, but as long as she has a plan that is fine.-thx

## 2015-05-23 NOTE — Telephone Encounter (Signed)
Report from CPAP therapy trial came in today. Reviewed by Dr. Milinda Cave. Per Dr. Milinda Cave advised pt of results on report. Pt advised and voiced understanding. Order faxed for BiPAP therapy.

## 2015-05-23 NOTE — Telephone Encounter (Signed)
Spoke to pt and she stated that she is referring to the disk/memory card they removed from her CPAP machine after the first 3 weeks to help them determine what her titration should be. I advised her that I will contact [employer] tomorrow to find out this information. Pt voiced understanding.

## 2015-05-28 ENCOUNTER — Other Ambulatory Visit: Payer: Self-pay | Admitting: *Deleted

## 2015-05-28 MED ORDER — LEVOTHYROXINE SODIUM 150 MCG PO TABS: ORAL_TABLET | ORAL | Status: DC

## 2015-05-28 NOTE — Telephone Encounter (Signed)
RF request for levothyroxine LOV: 12/21/14 Next ov: 06/15/15 Last written: 12/18/14 #30 w/ 3RF 12/21/14 TSH 2.63

## 2015-06-15 ENCOUNTER — Ambulatory Visit (INDEPENDENT_AMBULATORY_CARE_PROVIDER_SITE_OTHER): Payer: 59 | Admitting: Family Medicine

## 2015-06-15 ENCOUNTER — Encounter: Payer: Self-pay | Admitting: Family Medicine

## 2015-06-15 VITALS — BP 138/89 | HR 81 | Temp 98.1°F | Resp 16 | Ht 63.0 in | Wt 292.0 lb

## 2015-06-15 DIAGNOSIS — G4733 Obstructive sleep apnea (adult) (pediatric): Secondary | ICD-10-CM

## 2015-06-15 DIAGNOSIS — F329 Major depressive disorder, single episode, unspecified: Secondary | ICD-10-CM | POA: Diagnosis not present

## 2015-06-15 DIAGNOSIS — F32A Depression, unspecified: Secondary | ICD-10-CM

## 2015-06-15 DIAGNOSIS — M722 Plantar fascial fibromatosis: Secondary | ICD-10-CM | POA: Diagnosis not present

## 2015-06-15 DIAGNOSIS — Z23 Encounter for immunization: Secondary | ICD-10-CM

## 2015-06-15 DIAGNOSIS — E039 Hypothyroidism, unspecified: Secondary | ICD-10-CM

## 2015-06-15 NOTE — Progress Notes (Signed)
OFFICE VISIT  06/15/2015   CC:  Chief Complaint  Patient presents with  . Follow-up    Depression and Hypothyroid   HPI:    Patient is a 54 y.o. Caucasian female who presents for 6 mo f/u depression and hypothyroidism. Also undergoing eval for OSA--sleep study was abnl but she couldn't tolerate CPAP so she is in the process of getting set up for trial of BiPap.  She has been trying conservative therapy for R plantar fasciitis for 6-9 months at least and says she has had no improvement, is interested in seeing ortho specialist.  Depression: improved overall on  wellbut dosing, says she can feel a difference when she misses a dose.  Satisfied with treatment at this time, declines counseling or additional med.  Taking levothyroxine daily/correctly.   Past Medical History  Diagnosis Date  . High triglycerides   . Hypothyroidism   . Hypertension   . Allergy   . Asthma   . Neuromuscular disorder (HCC)   . Herniated disc L4-L5  . Sprain of posterior cruciate ligament of knee right knee  . Internal hemorrhoids   . IBS (irritable bowel syndrome)   . Neuropathic pain     Rx'd neurontin by her orthopedist    Past Surgical History  Procedure Laterality Date  . Total abdominal hysterectomy w/ bilateral salpingoophorectomy  approx 2006    Nonmalignant reasons  . Colonoscopy w/ polypectomy  09/2012    Dr. Loreta Ave: diverticulosis.  Recall 5 yrs.    Outpatient Prescriptions Prior to Visit  Medication Sig Dispense Refill  . buPROPion (WELLBUTRIN XL) 300 MG 24 hr tablet Take 1 tablet (300 mg total) by mouth daily. 90 tablet 3  . gabapentin (NEURONTIN) 100 MG capsule Take 1 capsule (100 mg total) by mouth 3 (three) times daily. 270 capsule 3  . levothyroxine (SYNTHROID, LEVOTHROID) 150 MCG tablet TAKE 1 TABLET BY MOUTH EVERY DAY 30 tablet 12   No facility-administered medications prior to visit.    Allergies  Allergen Reactions  . Nsaids Anaphylaxis  . Aspirin     Short of  breath, nasal congestion  . Sulfa Antibiotics Rash    ROS As per HPI  PE: Blood pressure 138/89, pulse 81, temperature 98.1 F (36.7 C), temperature source Oral, resp. rate 16, height  (1.6 m), weight 292 lb (132.45 kg), SpO2 95 %. Gen: Alert, well appearing.  Patient is oriented to person, place, time, and situation. AFFECT: pleasant, lucid thought and speech. R foot with moderate TTP over heel, primarily at med calc tubercle.  LABS:  Lab Results  Component Value Date   TSH 2.63 12/21/2014     Chemistry      Component Value Date/Time   NA 140 12/21/2014 0856   K 4.0 12/21/2014 0856   CL 107 12/21/2014 0856   CO2 27 12/21/2014 0856   BUN 13 12/21/2014 0856   CREATININE 0.74 12/21/2014 0856   CREATININE 0.68 10/05/2013 0833      Component Value Date/Time   CALCIUM 9.2 12/21/2014 0856   ALKPHOS 54 12/21/2014 0856   AST 17 12/21/2014 0856   ALT 23 12/21/2014 0856   BILITOT 0.6 12/21/2014 0856     Lab Results  Component Value Date   WBC 5.1 12/21/2014   HGB 13.3 12/21/2014   HCT 39.7 12/21/2014   MCV 83.4 12/21/2014   PLT 305.0 12/21/2014   Lab Results  Component Value Date   CHOL 195 12/21/2014   HDL 40.70 12/21/2014   LDLCALC  76 10/05/2013   LDLDIRECT 54.0 12/21/2014   TRIG 231.0* 12/21/2014   CHOLHDL 5 12/21/2014   IMPRESSION AND PLAN:  1) Depression: The current medical regimen is effective;  continue present plan and medications.  2) Hypothyroidism: The current medical regimen is effective;  continue present plan and medications.  3) OSA: in the midst of BiPap being set up.  Pt to call our office if she needs any assistance.  4) R plantar fasciitis: refer to ortho as per pt request.  An After Visit Summary was printed and given to the patient.  FOLLOW UP: Return in about 6 months (around 12/14/2015) for annual CPE (fasting).

## 2015-06-15 NOTE — Progress Notes (Signed)
Pre visit review using our clinic review tool, if applicable. No additional management support is needed unless otherwise documented below in the visit note. 

## 2015-09-09 DIAGNOSIS — M797 Fibromyalgia: Secondary | ICD-10-CM

## 2015-09-09 HISTORY — DX: Fibromyalgia: M79.7

## 2015-10-19 ENCOUNTER — Ambulatory Visit (INDEPENDENT_AMBULATORY_CARE_PROVIDER_SITE_OTHER): Payer: 59 | Admitting: Internal Medicine

## 2015-10-19 ENCOUNTER — Encounter: Payer: Self-pay | Admitting: Internal Medicine

## 2015-10-19 VITALS — BP 110/64 | HR 88 | Ht 63.5 in | Wt 299.6 lb

## 2015-10-19 DIAGNOSIS — G4733 Obstructive sleep apnea (adult) (pediatric): Secondary | ICD-10-CM

## 2015-10-19 DIAGNOSIS — E038 Other specified hypothyroidism: Secondary | ICD-10-CM

## 2015-10-19 DIAGNOSIS — E034 Atrophy of thyroid (acquired): Secondary | ICD-10-CM | POA: Diagnosis not present

## 2015-10-19 NOTE — Assessment & Plan Note (Signed)
She reports successful management

## 2015-10-19 NOTE — Progress Notes (Signed)
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 Unattended home sleep test ([facility]) 01/10/15- AHI 49.2/ hr, desat to 61%, body weight 288 lbs Medical problems have included morbid obesity, hypothyroid, asthma, lumbar degenerative disc disease, depression Here today with husband. They describe loud snoring witnessed apneas and daytime fatigue before initial therapy. She tried CPAP and then BiPAP with impression that she may sleep better. However almost every night she takes mask off in her sleep after a couple of hours, without recollection. She has had a few episodes where there didn't seem to be enough airflow but ordinarily the mask and pressures have been comfortable.   Using [facility]- we are contacting for download.  They sleep on a waterbed. He moves around more and sometimes crowds her at night. Otherwise she is physically comfortable with nocturia 1. No ENT surgery. History of seasonal allergic rhinitis and chronic sinusitis but she denies lung or heart problems. Epworth score today 1-2/24 "rare naps"  Prior to Admission medications   Medication Sig Start Date End Date Taking? Authorizing Provider  buPROPion (WELLBUTRIN XL) 300 MG 24 hr tablet Take 1 tablet (300 mg total) by mouth daily. 12/21/14  Yes Jeoffrey Massed, MD  calcium carbonate (TUMS - DOSED IN MG ELEMENTAL CALCIUM) 500 MG chewable tablet Chew 1 tablet by mouth as needed for indigestion or heartburn.   Yes Historical Provider, MD  gabapentin (NEURONTIN) 100 MG capsule Take 1 capsule (100 mg total) by mouth 3 (three) times daily. 12/21/14  Yes Jeoffrey Massed, MD  levothyroxine (SYNTHROID, LEVOTHROID) 150 MCG tablet TAKE 1 TABLET BY MOUTH EVERY DAY 05/28/15  Yes Jeoffrey Massed, MD   Past Medical History  Diagnosis Date  . High triglycerides   . Hypothyroidism   .  Hypertension   . Allergy   . Asthma   . Neuromuscular disorder (HCC)   . Herniated disc L4-L5  . Sprain of posterior cruciate ligament of knee right knee  . Internal hemorrhoids   . IBS (irritable bowel syndrome)   . Neuropathic pain     Rx'd neurontin by her orthopedist   Past Surgical History  Procedure Laterality Date  . Total abdominal hysterectomy w/ bilateral salpingoophorectomy  approx 2006    Nonmalignant reasons  . Colonoscopy w/ polypectomy  09/2012    Dr. Loreta Ave: diverticulosis.  Recall 5 yrs.   Family History  Problem Relation Age of Onset  . Arthritis Mother     osteo arithritis  . Obesity Mother   . Fibroids Mother     uterine  . Lymphoma Father   . Sarcoidosis Father   . Pulmonary fibrosis Father   . Arthritis Maternal Grandmother     rheumatoid  . Cancer Maternal Grandmother     cervical  . Heart disease Maternal Grandfather     a-fib  . COPD Maternal Grandfather     emphysema  . Vision loss Paternal Grandmother   . Emphysema Paternal Grandfather   . Lung cancer Paternal Grandfather   . Celiac disease Sister   . Asthma Son   . Cancer Daughter     ovarian  . Obesity Sister    Social History   Social History  . Marital Status: Married    Spouse Name: N/A  . Number of Children: N/A  . Years of Education: N/A   Occupational History  . Not on file.  Social History Main Topics  . Smoking status: Never Smoker   . Smokeless tobacco: Not on file  . Alcohol Use: Not on file     Comment: rarely  . Drug Use: Not on file  . Sexual Activity:    Partners: Male    Pharmacist, hospital Protection: None   Other Topics Concern  . Not on file   Social History Narrative   Married 26 yrs, 3 children (1 daught, 2 sons).   [place] from [place].   Occupation: Works for [employer], does medication mgmt evals in NH/ALF's.   No tobacco, very rare alcohol, no drugs.            ROS-see HPI   Negative unless "+" Constitutional:    weight loss, night  sweats, fevers, chills, fatigue, lassitude. HEENT:    headaches, difficulty swallowing, tooth/dental problems, sore throat,       sneezing, itching, ear ache,+ nasal congestion, post nasal drip, snoring CV:    chest pain, orthopnea, PND, swelling in lower extremities, anasarca,                                                     dizziness, palpitations Resp:   shortness of breath with exertion or at rest.                +productive cough,   non-productive cough, coughing up of blood.              change in color of mucus.  wheezing.   Skin:    rash or lesions. GI:  + heartburn, indigestion, abdominal pain, nausea, vomiting, diarrhea,                 change in bowel habits, loss of appetite GU: dysuria, change in color of urine, no urgency or frequency.   flank pain. MS:   joint pain, stiffness, decreased range of motion, back pain. Neuro-     nothing unusual Psych:  change in mood or affect.  depression or +anxiety.   memory loss.  OBJ- Physical Exam General- Alert, Oriented, Affect-appropriate, Distress- none acute, + morbidly obese Skin- rash-none, lesions- none, excoriation- none Lymphadenopathy- none Head- atraumatic            Eyes- Gross vision intact, PERRLA, conjunctivae and secretions clear            Ears- Hearing, canals-normal            Nose- Clear, no-Septal dev, mucus, polyps, erosion, perforation             Throat- Mallampati II-III , mucosa clear , drainage- none, tonsils- atrophic Neck- flexible , trachea midline, no stridor , thyroid nl, carotid no bruit Chest - symmetrical excursion , unlabored           Heart/CV- RRR , no murmur , no gallop  , no rub, nl s1 s2                           - JVD- none , edema- none, stasis changes- none, varices- none           Lung- clear to P&A, wheeze- none, cough- none , dullness-none, rub- none           Chest wall-  Abd-  Br/ Gen/  Rectal- Not done, not indicated Extrem- cyanosis- none, clubbing, none, atrophy- none, strength-  nl Neuro- grossly intact to observation

## 2015-10-19 NOTE — Assessment & Plan Note (Signed)
We are requesting a download from her DME company for current status of her BiPAP machine including pressures and compliance. We will be looking for comfort measures that might help her keep CPAP on through the night. Some people never can make the adjustment. Weight loss is strongly encouraged. Bariatric referral might be worthwhile. A fitted oral appliance may be the next intervention.

## 2015-10-19 NOTE — Assessment & Plan Note (Signed)
Weight loss would significantly improve management for obstructive sleep apnea and other medical problems. Consider bariatric referral.

## 2015-10-19 NOTE — Patient Instructions (Signed)
We will get the download information from [facility] and from that look at options.

## 2015-11-14 ENCOUNTER — Ambulatory Visit: Payer: 59

## 2015-12-11 DIAGNOSIS — G4733 Obstructive sleep apnea (adult) (pediatric): Secondary | ICD-10-CM | POA: Diagnosis not present

## 2015-12-28 ENCOUNTER — Encounter: Payer: 59 | Admitting: Family Medicine

## 2015-12-28 ENCOUNTER — Encounter: Payer: Self-pay | Admitting: Internal Medicine

## 2016-01-10 DIAGNOSIS — G4733 Obstructive sleep apnea (adult) (pediatric): Secondary | ICD-10-CM | POA: Diagnosis not present

## 2016-01-14 ENCOUNTER — Encounter: Payer: 59 | Admitting: Family Medicine

## 2016-01-21 ENCOUNTER — Other Ambulatory Visit: Payer: Self-pay | Admitting: *Deleted

## 2016-01-21 MED ORDER — BUPROPION HCL ER (XL) 300 MG PO TB24: 300.0000 mg | ORAL_TABLET | Freq: Every day | ORAL | Status: DC

## 2016-01-21 NOTE — Telephone Encounter (Signed)
RF request for bupropion LOV: 06/15/15 Next ov: None Last written: 12/21/14 #90 w/ 3RF

## 2016-01-23 DIAGNOSIS — G4733 Obstructive sleep apnea (adult) (pediatric): Secondary | ICD-10-CM | POA: Diagnosis not present

## 2016-01-29 ENCOUNTER — Encounter: Payer: Self-pay | Admitting: Family Medicine

## 2016-01-29 DIAGNOSIS — G4733 Obstructive sleep apnea (adult) (pediatric): Secondary | ICD-10-CM

## 2016-01-29 NOTE — Telephone Encounter (Signed)
Please advise. Thanks.  

## 2016-01-30 NOTE — Telephone Encounter (Signed)
OK. I'll order referral per pt request.

## 2016-02-01 ENCOUNTER — Ambulatory Visit: Payer: Self-pay | Admitting: Internal Medicine

## 2016-02-10 DIAGNOSIS — G4733 Obstructive sleep apnea (adult) (pediatric): Secondary | ICD-10-CM | POA: Diagnosis not present

## 2016-02-21 DIAGNOSIS — G4733 Obstructive sleep apnea (adult) (pediatric): Secondary | ICD-10-CM | POA: Diagnosis not present

## 2016-02-21 DIAGNOSIS — J329 Chronic sinusitis, unspecified: Secondary | ICD-10-CM | POA: Diagnosis not present

## 2016-02-23 DIAGNOSIS — G4733 Obstructive sleep apnea (adult) (pediatric): Secondary | ICD-10-CM | POA: Diagnosis not present

## 2016-02-28 DIAGNOSIS — G4733 Obstructive sleep apnea (adult) (pediatric): Secondary | ICD-10-CM | POA: Diagnosis not present

## 2016-03-10 DIAGNOSIS — G4733 Obstructive sleep apnea (adult) (pediatric): Secondary | ICD-10-CM | POA: Diagnosis not present

## 2016-03-11 DIAGNOSIS — G4733 Obstructive sleep apnea (adult) (pediatric): Secondary | ICD-10-CM | POA: Diagnosis not present

## 2016-03-24 DIAGNOSIS — G4733 Obstructive sleep apnea (adult) (pediatric): Secondary | ICD-10-CM | POA: Diagnosis not present

## 2016-04-07 DIAGNOSIS — G4733 Obstructive sleep apnea (adult) (pediatric): Secondary | ICD-10-CM | POA: Diagnosis not present

## 2016-04-18 DIAGNOSIS — Z1231 Encounter for screening mammogram for malignant neoplasm of breast: Secondary | ICD-10-CM | POA: Diagnosis not present

## 2016-04-18 LAB — HM MAMMOGRAPHY

## 2016-04-21 ENCOUNTER — Encounter: Payer: Self-pay | Admitting: Family Medicine

## 2016-05-09 ENCOUNTER — Ambulatory Visit: Payer: Self-pay | Admitting: Family Medicine

## 2016-05-16 ENCOUNTER — Encounter: Payer: Self-pay | Admitting: Family Medicine

## 2016-05-16 ENCOUNTER — Ambulatory Visit (INDEPENDENT_AMBULATORY_CARE_PROVIDER_SITE_OTHER): Payer: Self-pay | Admitting: Family Medicine

## 2016-05-16 VITALS — BP 127/80 | HR 77 | Temp 97.9°F | Resp 16 | Ht 62.75 in | Wt 296.0 lb

## 2016-05-16 DIAGNOSIS — M609 Myositis, unspecified: Secondary | ICD-10-CM | POA: Diagnosis not present

## 2016-05-16 DIAGNOSIS — R5383 Other fatigue: Secondary | ICD-10-CM

## 2016-05-16 DIAGNOSIS — E039 Hypothyroidism, unspecified: Secondary | ICD-10-CM

## 2016-05-16 DIAGNOSIS — M791 Myalgia: Secondary | ICD-10-CM | POA: Diagnosis not present

## 2016-05-16 DIAGNOSIS — IMO0001 Reserved for inherently not codable concepts without codable children: Secondary | ICD-10-CM

## 2016-05-16 DIAGNOSIS — H538 Other visual disturbances: Secondary | ICD-10-CM | POA: Diagnosis not present

## 2016-05-16 DIAGNOSIS — M2550 Pain in unspecified joint: Secondary | ICD-10-CM

## 2016-05-16 LAB — COMPREHENSIVE METABOLIC PANEL
ALBUMIN: 4.2 g/dL (ref 3.5–5.2)
ALT: 16 U/L (ref 0–35)
AST: 15 U/L (ref 0–37)
Alkaline Phosphatase: 54 U/L (ref 39–117)
BILIRUBIN TOTAL: 0.4 mg/dL (ref 0.2–1.2)
BUN: 16 mg/dL (ref 6–23)
CALCIUM: 9.3 mg/dL (ref 8.4–10.5)
CHLORIDE: 107 meq/L (ref 96–112)
CO2: 27 meq/L (ref 19–32)
Creatinine, Ser: 0.81 mg/dL (ref 0.40–1.20)
GFR: 78.22 mL/min (ref >60.00)
Glucose, Bld: 90 mg/dL (ref 70–99)
Potassium: 4 mEq/L (ref 3.5–5.1)
Sodium: 139 mEq/L (ref 135–145)
Total Protein: 6.9 g/dL (ref 6.0–8.3)

## 2016-05-16 LAB — CBC WITH DIFFERENTIAL/PLATELET
BASOS PCT: 0.5 % (ref 0.0–3.0)
Basophils Absolute: 0 10*3/uL (ref 0.0–0.1)
EOS PCT: 3.9 % (ref 0.0–5.0)
Eosinophils Absolute: 0.2 10*3/uL (ref 0.0–0.7)
HCT: 40.1 % (ref 36.0–46.0)
Hemoglobin: 13.8 g/dL (ref 12.0–15.0)
LYMPHS ABS: 2 10*3/uL (ref 0.7–4.0)
Lymphocytes Relative: 32.9 % (ref 12.0–46.0)
MCHC: 34.5 g/dL (ref 30.0–36.0)
MCV: 83.1 fl (ref 78.0–100.0)
MONO ABS: 0.4 10*3/uL (ref 0.1–1.0)
Monocytes Relative: 6.4 % (ref 3.0–12.0)
NEUTROS ABS: 3.5 10*3/uL (ref 1.4–7.7)
NEUTROS PCT: 56.3 % (ref 43.0–77.0)
Platelets: 352 10*3/uL (ref 150.0–400.0)
RBC: 4.82 Mil/uL (ref 3.87–5.11)
RDW: 14.1 % (ref 11.5–15.5)
WBC: 6.1 10*3/uL (ref 4.0–10.5)

## 2016-05-16 LAB — HEMOGLOBIN A1C: Hgb A1c MFr Bld: 5.2 % (ref 4.6–6.5)

## 2016-05-16 LAB — TSH: TSH: 2.21 u[IU]/mL (ref 0.35–4.50)

## 2016-05-16 LAB — CK: CK TOTAL: 78 U/L (ref 7–177)

## 2016-05-16 LAB — RHEUMATOID FACTOR

## 2016-05-16 LAB — SEDIMENTATION RATE: Sed Rate: 7 mm/hr (ref 0–30)

## 2016-05-16 NOTE — Progress Notes (Signed)
OFFICE VISIT  05/16/2016   CC:  Chief Complaint  Patient presents with  . Night Sweats    1 week ago daily  . Constipation    2 months  . Eye Problem    1 month    HPI:    Patient is a 55 y.o. Caucasian female who presents for diffuse tenderness to touch on many areas of her body. Upper body more than lower body.  Sometimes has body pains that wake her up at night.   This seemed to begin about 2 months ago, getting worse the last couple of weeks.  She feels it about 5/7 days a week. No joint swelling, redness, or warmth.  No skin rash.  Question of generalized feeling of weakness, upper body >lower body.  She finds no temporal pattern to her pain.  Lots of the pains she describes only last minutes at a time, occ myalgias for hours at a time. At various times she has some word finding difficulty.  ROS: appetite is good.  Constipation occcuring more lately.  Some intermittent urge incontinence problems. Has tension HA couple times per week.  Has intermittent blurry vision.  No SOB, no palp's, no CP. +occ night sweats.  No polyuria or polydipsia.  Past Medical History:  Diagnosis Date  . Allergy   . Asthma   . Herniated disc L4-L5  . High triglycerides   . Hypertension   . Hypothyroidism   . IBS (irritable bowel syndrome)   . Internal hemorrhoids   . Neuromuscular disorder ([place])   . Neuropathic pain    Rx'd neurontin by her orthopedist  . Sprain of posterior cruciate ligament of knee right knee  OSA on CPAP.  Past Surgical History:  Procedure Laterality Date  . COLONOSCOPY W/ POLYPECTOMY  09/2012   Dr. Collene Mares: diverticulosis.  Recall 5 yrs.  Marland Kitchen TOTAL ABDOMINAL HYSTERECTOMY W/ BILATERAL SALPINGOOPHORECTOMY  approx 2006   Nonmalignant reasons   Family History  Problem Relation Age of Onset  . Arthritis Mother     osteo arithritis  . Obesity Mother   . Fibroids Mother     uterine  . Lymphoma Father   . Sarcoidosis Father   . Pulmonary fibrosis Father   . Arthritis  Maternal Grandmother     rheumatoid  . Cancer Maternal Grandmother     cervical  . Heart disease Maternal Grandfather     a-fib  . COPD Maternal Grandfather     emphysema  . Vision loss Paternal Grandmother   . Emphysema Paternal Grandfather   . Lung cancer Paternal Grandfather   . Celiac disease Sister   . Asthma Son   . Cancer Daughter     ovarian  . Obesity Sister     Outpatient Medications Prior to Visit  Medication Sig Dispense Refill  . buPROPion (WELLBUTRIN XL) 300 MG 24 hr tablet Take 1 tablet (300 mg total) by mouth daily. 90 tablet 1  . calcium carbonate (TUMS - DOSED IN MG ELEMENTAL CALCIUM) 500 MG chewable tablet Chew 1 tablet by mouth as needed for indigestion or heartburn.    . gabapentin (NEURONTIN) 100 MG capsule Take 1 capsule (100 mg total) by mouth 3 (three) times daily. 270 capsule 3  . levothyroxine (SYNTHROID, LEVOTHROID) 150 MCG tablet TAKE 1 TABLET BY MOUTH EVERY DAY 30 tablet 12   No facility-administered medications prior to visit.     Allergies  Allergen Reactions  . Nsaids Anaphylaxis  . Sulfa Antibiotics Rash  . Aspirin  Short of breath, nasal congestion    ROS As per HPI  PE: Blood pressure 127/80, pulse 77, temperature 97.9 F (36.6 C), temperature source Oral, resp. rate 16, height 5' 2.75" (1.594 m), weight 296 lb (134.3 kg), SpO2 97 %. Gen: Alert, well appearing, obese WF in NAD.  Patient is oriented to person, place, time, and situation. AFFECT: pleasant, lucid thought and speech. EWY:BRKV: no injection, icteris, swelling, or exudate.  EOMI, PERRLA. Mouth: lips without lesion/swelling.  Oral mucosa pink and moist. Oropharynx without erythema, exudate, or swelling.  Neck - No masses or thyromegaly or limitation in range of motion CV: RRR, no m/r/g.   LUNGS: CTA bilat, nonlabored resps, good aeration in all lung fields. ABD: soft, rotund, NT. EXT: no clubbing, cyanosis, or edema.  MUSC: no tenderness anywhere except mild  discomfort to palpation near greater troch regions bilat and over deltoid regions of arms bilat.  ROM of shoulders, elbows, wrists, fingers, knees, ankles, and toes all intact. No joint swelling or redness or warmth.   UE and LE strength intact prox/dist bilat.    Vision screening: 20/25 OD, 20/40 OS, 20/40 OU  LABS:  Lab Results  Component Value Date   TSH 2.63 12/21/2014   Lab Results  Component Value Date   WBC 5.1 12/21/2014   HGB 13.3 12/21/2014   HCT 39.7 12/21/2014   MCV 83.4 12/21/2014   PLT 305.0 12/21/2014   Lab Results  Component Value Date   CREATININE 0.74 12/21/2014   BUN 13 12/21/2014   NA 140 12/21/2014   K 4.0 12/21/2014   CL 107 12/21/2014   CO2 27 12/21/2014   Lab Results  Component Value Date   ALT 23 12/21/2014   AST 17 12/21/2014   ALKPHOS 54 12/21/2014   BILITOT 0.6 12/21/2014   Lab Results  Component Value Date   CHOL 195 12/21/2014   Lab Results  Component Value Date   HDL 40.70 12/21/2014   Lab Results  Component Value Date   LDLCALC 76 10/05/2013   Lab Results  Component Value Date   TRIG 231.0 (H) 12/21/2014   Lab Results  Component Value Date   CHOLHDL 5 12/21/2014    IMPRESSION AND PLAN:  1) Myalgias, with less arthralgias.  Also some fatigue and foggyheadedness. High suspicion of fibromyalgia.  Will check ESR to r/o PMR. Will also draw some basic rheum labs: ANA w/reflex, Rh factor, CPK total. Given her fatigue, will recheck TSH, CMET, and CBC today. For her intermittent blurry vision, will check random glucose today and Hba1c as well.  An After Visit Summary was printed and given to the patient.  FOLLOW UP: Return for to be determined based on lab work up.  Signed:  Crissie Sickles, MD           05/16/2016

## 2016-05-17 LAB — ANA W/REFLEX: Anti Nuclear Antibody(ANA): NEGATIVE

## 2016-05-18 ENCOUNTER — Encounter: Payer: Self-pay | Admitting: Family Medicine

## 2016-06-20 ENCOUNTER — Encounter: Payer: Self-pay | Admitting: Family Medicine

## 2016-06-20 ENCOUNTER — Ambulatory Visit (INDEPENDENT_AMBULATORY_CARE_PROVIDER_SITE_OTHER): Payer: BLUE CROSS/BLUE SHIELD | Admitting: Family Medicine

## 2016-06-20 VITALS — BP 146/91 | HR 70 | Temp 97.6°F | Resp 20 | Wt 294.1 lb

## 2016-06-20 DIAGNOSIS — M797 Fibromyalgia: Secondary | ICD-10-CM | POA: Diagnosis not present

## 2016-06-20 DIAGNOSIS — Z23 Encounter for immunization: Secondary | ICD-10-CM

## 2016-06-20 MED ORDER — GABAPENTIN 100 MG PO CAPS: ORAL_CAPSULE | ORAL | 1 refills | Status: DC

## 2016-06-20 NOTE — Progress Notes (Signed)
Pre visit review using our clinic review tool, if applicable. No additional management support is needed unless otherwise documented below in the visit note. 

## 2016-06-20 NOTE — Progress Notes (Signed)
OFFICE VISIT  06/20/2016   CC:  Chief Complaint  Patient presents with  . Follow-up    lab results     HPI:    Patient is a 55 y.o. Caucasian female who presents for 1 mo f/u myalgias/arthralgias suspicious for fibromyalgia.  Reviewed all rheum labs done last visit: all normal. Has been on 100 mg bid gabapentin for about 2 wks.  Doesn't really feel any improvement yet in her muscle or joint aches or her chronic fatigue.  No new c/o.  Past Medical History:  Diagnosis Date  . Allergy   . Asthma   . Fibromyalgia 2017  . Herniated disc L4-L5  . High triglycerides   . Hypertension   . Hypothyroidism   . IBS (irritable bowel syndrome)   . Internal hemorrhoids   . Neuromuscular disorder (HCC)   . Neuropathic pain    Rx'd neurontin by her orthopedist  . Sprain of posterior cruciate ligament of knee right knee    Past Surgical History:  Procedure Laterality Date  . COLONOSCOPY W/ POLYPECTOMY  09/2012   Dr. Loreta AveMann: diverticulosis.  Recall 5 yrs.  Marland Kitchen. TOTAL ABDOMINAL HYSTERECTOMY W/ BILATERAL SALPINGOOPHORECTOMY  approx 2006   Nonmalignant reasons    Outpatient Medications Prior to Visit  Medication Sig Dispense Refill  . buPROPion (WELLBUTRIN XL) 300 MG 24 hr tablet Take 1 tablet (300 mg total) by mouth daily. 90 tablet 1  . calcium carbonate (TUMS - DOSED IN MG ELEMENTAL CALCIUM) 500 MG chewable tablet Chew 1 tablet by mouth as needed for indigestion or heartburn.    . fluticasone (FLONASE) 50 MCG/ACT nasal spray Place into the nose.    . levothyroxine (SYNTHROID, LEVOTHROID) 150 MCG tablet TAKE 1 TABLET BY MOUTH EVERY DAY 30 tablet 12  . gabapentin (NEURONTIN) 100 MG capsule Take 1 capsule (100 mg total) by mouth 3 (three) times daily. 270 capsule 3   No facility-administered medications prior to visit.     Allergies  Allergen Reactions  . Nsaids Anaphylaxis  . Sulfa Antibiotics Rash  . Aspirin     Short of breath, nasal congestion    ROS As per HPI  PE: Blood  pressure (!) 146/91, pulse 70, temperature 97.6 F (36.4 C), temperature source Oral, resp. rate 20, weight 294 lb 1.9 oz (133.4 kg), SpO2 99 %. Gen: Alert, well appearing.  Patient is oriented to person, place, time, and situation. AFFECT: pleasant, lucid thought and speech. No further exam today.  LABS:  None today    Chemistry      Component Value Date/Time   NA 139 05/16/2016 1157   K 4.0 05/16/2016 1157   CL 107 05/16/2016 1157   CO2 27 05/16/2016 1157   BUN 16 05/16/2016 1157   CREATININE 0.81 05/16/2016 1157   CREATININE 0.68 10/05/2013 0833      Component Value Date/Time   CALCIUM 9.3 05/16/2016 1157   ALKPHOS 54 05/16/2016 1157   AST 15 05/16/2016 1157   ALT 16 05/16/2016 1157   BILITOT 0.4 05/16/2016 1157     Lab Results  Component Value Date   WBC 6.1 05/16/2016   HGB 13.8 05/16/2016   HCT 40.1 05/16/2016   MCV 83.1 05/16/2016   PLT 352.0 05/16/2016   Lab Results  Component Value Date   ANA Negative 05/16/2016   RF <10 05/16/2016   Lab Results  Component Value Date   ESRSEDRATE 7 05/16/2016   Lab Results  Component Value Date   CKTOTAL 78 05/16/2016  Lab Results  Component Value Date   TSH 2.21 05/16/2016    IMPRESSION AND PLAN:  Fibromyalgia syndrome. Reviewed all recent general+ rheum labs: NORMAL.  Reassured pt. Focus on trying to gradually up-titrate gabapentin to 300 mg tid using 100 mg tabs. Therapeutic expectations and side effect profile of medication discussed today.  Patient's questions answered. We'll see how much she's been able to titrate the gabapentin in 6 weeks and see how she's feeling. Brought up potential of switching from wellbutrin to duloxetine in the future but we'll see what happens with gabapentin first.  An After Visit Summary was printed and given to the patient.  FOLLOW UP: Return in about 6 weeks (around 08/01/2016) for f/u fibromyalgia.  Signed:  Santiago Bumpers, MD           06/20/2016

## 2016-06-23 ENCOUNTER — Ambulatory Visit: Payer: Self-pay | Admitting: Internal Medicine

## 2016-08-07 ENCOUNTER — Ambulatory Visit: Payer: BLUE CROSS/BLUE SHIELD | Admitting: Family Medicine

## 2016-08-19 ENCOUNTER — Other Ambulatory Visit: Payer: Self-pay | Admitting: *Deleted

## 2016-08-19 MED ORDER — LEVOTHYROXINE SODIUM 150 MCG PO TABS: ORAL_TABLET | ORAL | 12 refills | Status: DC

## 2016-08-19 NOTE — Telephone Encounter (Signed)
Fax from [facility] requesting levothyroxine  LOV: 05/16/16 NOV:08/22/16 Last written: 05/28/15 #30 w/ 12  05/16/16 TSH 2.21

## 2016-08-22 ENCOUNTER — Encounter: Payer: Self-pay | Admitting: Family Medicine

## 2016-08-22 ENCOUNTER — Ambulatory Visit (INDEPENDENT_AMBULATORY_CARE_PROVIDER_SITE_OTHER): Payer: BLUE CROSS/BLUE SHIELD | Admitting: Family Medicine

## 2016-08-22 VITALS — BP 138/92 | HR 69 | Temp 98.4°F | Resp 16 | Ht 62.75 in | Wt 292.2 lb

## 2016-08-22 DIAGNOSIS — M797 Fibromyalgia: Secondary | ICD-10-CM | POA: Diagnosis not present

## 2016-08-22 MED ORDER — DULOXETINE HCL 60 MG PO CPEP: 60.0000 mg | ORAL_CAPSULE | Freq: Every day | ORAL | 3 refills | Status: DC

## 2016-08-22 NOTE — Progress Notes (Signed)
OFFICE VISIT  08/22/2016   CC:  Chief Complaint  Patient presents with  . Follow-up    fibromyalgia   HPI:    Patient is a 55 y.o. Caucasian female who presents for 2 mo f/u fibromyalgia. Gabapentin dosing at 200 mg qAM and 300mg  qhs.  Morning dose does cause drowsiness and she is drinking caffeinated drinks to balance this out. It seems to be helping with her widespread pain--shoulders, back of neck, "different places", but she has difficulty quantifying how much improvement.  Even the weight of her cat walking across her or sitting in her lab causes discomfort in her muscles sometimes.    Past Medical History:  Diagnosis Date  . Allergy   . Asthma   . Fibromyalgia 2017  . Herniated disc L4-L5  . High triglycerides   . Hypertension   . Hypothyroidism   . IBS (irritable bowel syndrome)   . Internal hemorrhoids   . Neuromuscular disorder (HCC)   . Neuropathic pain    Rx'd neurontin by her orthopedist  . Sprain of posterior cruciate ligament of knee right knee    Past Surgical History:  Procedure Laterality Date  . COLONOSCOPY W/ POLYPECTOMY  09/2012   Dr. Loreta AveMann: diverticulosis.  Recall 5 yrs.  Marland Kitchen. TOTAL ABDOMINAL HYSTERECTOMY W/ BILATERAL SALPINGOOPHORECTOMY  approx 2006   Nonmalignant reasons    Outpatient Medications Prior to Visit  Medication Sig Dispense Refill  . calcium carbonate (TUMS - DOSED IN MG ELEMENTAL CALCIUM) 500 MG chewable tablet Chew 1 tablet by mouth as needed for indigestion or heartburn.    . fluticasone (FLONASE) 50 MCG/ACT nasal spray Place into the nose.    . gabapentin (NEURONTIN) 100 MG capsule 1-3 tabs po tid (patient titrating) 270 capsule 1  . levothyroxine (SYNTHROID, LEVOTHROID) 150 MCG tablet TAKE 1 TABLET BY MOUTH EVERY DAY 30 tablet 12  . buPROPion (WELLBUTRIN XL) 300 MG 24 hr tablet Take 1 tablet (300 mg total) by mouth daily. 90 tablet 1   No facility-administered medications prior to visit.     Allergies  Allergen Reactions  .  Nsaids Anaphylaxis  . Sulfa Antibiotics Rash  . Aspirin     Short of breath, nasal congestion    ROS As per HPI  PE: Blood pressure (!) 138/92, pulse 69, temperature 98.4 F (36.9 C), temperature source Oral, resp. rate 16, height 5' 2.75" (1.594 m), weight 292 lb 4 oz (132.6 kg), SpO2 94 %. Gen: Alert, well appearing.  Patient is oriented to person, place, time, and situation. AFFECT: pleasant, lucid thought and speech. Mild TTP in soft tissues in both triceps areas, both flanks, and both lateral hip areas---all mild. Otherwise no other areas of tenderness today.   LABS:  Lab Results  Component Value Date   TSH 2.21 05/16/2016     Chemistry      Component Value Date/Time   NA 139 05/16/2016 1157   K 4.0 05/16/2016 1157   CL 107 05/16/2016 1157   CO2 27 05/16/2016 1157   BUN 16 05/16/2016 1157   CREATININE 0.81 05/16/2016 1157   CREATININE 0.68 10/05/2013 0833      Component Value Date/Time   CALCIUM 9.3 05/16/2016 1157   ALKPHOS 54 05/16/2016 1157   AST 15 05/16/2016 1157   ALT 16 05/16/2016 1157   BILITOT 0.4 05/16/2016 1157     Lab Results  Component Value Date   HGBA1C 5.2 05/16/2016    IMPRESSION AND PLAN:  Fibromyalgia: mild improvement with gabapentin.  Continue 200 mg morning dose, increase hs dose by 100mg  every few days to get to 600 mg hs dosing. Additionally, we'll d/c wellbutrin xl and start duloxetine 60mg  qd.  An After Visit Summary was printed and given to the patient.  FOLLOW UP: Return in about 2 months (around 10/23/2016) for f/u fibromyalgia.  Signed:  Santiago BumpersPhil McGowen, MD           08/22/2016

## 2016-08-22 NOTE — Progress Notes (Signed)
Pre visit review using our clinic review tool, if applicable. No additional management support is needed unless otherwise documented below in the visit note. 

## 2016-08-22 NOTE — Addendum Note (Signed)
Addended by: Regan RakersAY, LAURA K on: 08/22/2016 11:49 AM   Modules accepted: Orders

## 2016-10-17 ENCOUNTER — Other Ambulatory Visit: Payer: Self-pay | Admitting: Family Medicine

## 2016-10-17 MED ORDER — GABAPENTIN 300 MG PO CAPS: ORAL_CAPSULE | ORAL | 1 refills | Status: DC

## 2016-10-17 NOTE — Telephone Encounter (Signed)
Patient requesting refill of gabapentin (NEURONTIN) 100 MG capsule.  Patient states she takes 300mg  in the morning and 600 mg at night.  She wants to know if dosage can be changed to 300mg  instead of 100mg .   Pharmacy: [id number] - [place], [place] - [address] [phone] (Phone) [phone] (Fax)

## 2016-10-17 NOTE — Telephone Encounter (Signed)
Pt would like to increase mg. See note below.  RF request for gabapentin LOV: 06/20/16 Next ov: 10/31/16 Last written: 06/20/16 #270 w/ 1RF

## 2016-10-28 DIAGNOSIS — M25561 Pain in right knee: Secondary | ICD-10-CM | POA: Diagnosis not present

## 2016-10-31 ENCOUNTER — Ambulatory Visit: Payer: BLUE CROSS/BLUE SHIELD | Admitting: Family Medicine

## 2016-11-28 ENCOUNTER — Ambulatory Visit (INDEPENDENT_AMBULATORY_CARE_PROVIDER_SITE_OTHER): Payer: BLUE CROSS/BLUE SHIELD | Admitting: Family Medicine

## 2016-11-28 ENCOUNTER — Encounter: Payer: Self-pay | Admitting: Family Medicine

## 2016-11-28 VITALS — BP 144/91 | HR 68 | Temp 98.0°F | Resp 16 | Ht 62.75 in | Wt 293.5 lb

## 2016-11-28 DIAGNOSIS — M797 Fibromyalgia: Secondary | ICD-10-CM | POA: Diagnosis not present

## 2016-11-28 MED ORDER — DULOXETINE HCL 60 MG PO CPEP: 60.0000 mg | ORAL_CAPSULE | Freq: Every day | ORAL | 3 refills | Status: DC

## 2016-11-28 MED ORDER — PREGABALIN 75 MG PO CAPS: 75.0000 mg | ORAL_CAPSULE | Freq: Two times a day (BID) | ORAL | 1 refills | Status: DC

## 2016-11-28 NOTE — Progress Notes (Signed)
Pre visit review using our clinic review tool, if applicable. No additional management support is needed unless otherwise documented below in the visit note. 

## 2016-11-28 NOTE — Patient Instructions (Signed)
Take 2 of the 100 mg at bedtime for 3d, then take 1 of the 100mg  gabapentin at bedtime x 3d, then stop. The next day, start the lyrica.

## 2016-11-28 NOTE — Progress Notes (Signed)
OFFICE VISIT  11/28/2016   CC:  Chief Complaint  Patient presents with  . Follow-up    Fibromyalgia   HPI:    Patient is a 56 y.o. Caucasian female who presents for 3 mo f/u fibromyalgia. Last visit we planned on up-titration of her evening gabapentin dose to 600 mg qhs, and we added cymbalta 60mg  qd.  She felt like 600 mg qhs caused excessive daytime drowsiness.  She cut back to 400 mg qhs.  Takes no morning gabapentin b/c too sedating. She did start the cymbalta 60mg  daily.  She can't really tell if this has helped or not. Essentially still having significant various body aches, sometimes shoulders, sometimes elbows, back or neck.  Occ feet.  No joint swelling or redness.  No fevers. She has significant fatigue still+"foggy" cognition--made worse by the gabapentin.   Past Medical History:  Diagnosis Date  . Allergy   . Asthma   . Fibromyalgia 2017  . Herniated disc L4-L5  . High triglycerides   . Hypertension   . Hypothyroidism   . IBS (irritable bowel syndrome)   . Internal hemorrhoids   . Neuromuscular disorder (HCC)   . Neuropathic pain    Rx'd neurontin by her orthopedist  . Sprain of posterior cruciate ligament of knee right knee    Past Surgical History:  Procedure Laterality Date  . COLONOSCOPY W/ POLYPECTOMY  09/2012   Dr. Loreta AveMann: diverticulosis.  Recall 5 yrs.  Marland Kitchen. TOTAL ABDOMINAL HYSTERECTOMY W/ BILATERAL SALPINGOOPHORECTOMY  approx 2006   Nonmalignant reasons    Outpatient Medications Prior to Visit  Medication Sig Dispense Refill  . calcium carbonate (TUMS - DOSED IN MG ELEMENTAL CALCIUM) 500 MG chewable tablet Chew 1 tablet by mouth as needed for indigestion or heartburn.    . levothyroxine (SYNTHROID, LEVOTHROID) 150 MCG tablet TAKE 1 TABLET BY MOUTH EVERY DAY 30 tablet 12  . DULoxetine (CYMBALTA) 60 MG capsule Take 1 capsule (60 mg total) by mouth daily. 30 capsule 3  . gabapentin (NEURONTIN) 300 MG capsule 1 cap po qAM and 2 caps po qhs 270 capsule 1   . fluticasone (FLONASE) 50 MCG/ACT nasal spray Place into the nose.     No facility-administered medications prior to visit.     Allergies  Allergen Reactions  . Nsaids Anaphylaxis  . Sulfa Antibiotics Rash  . Aspirin     Short of breath, nasal congestion    ROS As per HPI  PE: Blood pressure (!) 144/91, pulse 68, temperature 98 F (36.7 C), temperature source Oral, resp. rate 16, height 5' 2.75" (1.594 m), weight 293 lb 8 oz (133.1 kg), SpO2 94 %. Gen: Alert, well appearing.  Patient is oriented to person, place, time, and situation. AFFECT: pleasant, lucid thought and speech. No further exam today.  LABS:  Lab Results  Component Value Date   TSH 2.21 05/16/2016   Lab Results  Component Value Date   WBC 6.1 05/16/2016   HGB 13.8 05/16/2016   HCT 40.1 05/16/2016   MCV 83.1 05/16/2016   PLT 352.0 05/16/2016   Lab Results  Component Value Date   CREATININE 0.81 05/16/2016   BUN 16 05/16/2016   NA 139 05/16/2016   K 4.0 05/16/2016   CL 107 05/16/2016   CO2 27 05/16/2016   Lab Results  Component Value Date   ALT 16 05/16/2016   AST 15 05/16/2016   ALKPHOS 54 05/16/2016   BILITOT 0.4 05/16/2016   Lab Results  Component Value Date  CHOL 195 12/21/2014   Lab Results  Component Value Date   HDL 40.70 12/21/2014   Lab Results  Component Value Date   LDLCALC 76 10/05/2013   Lab Results  Component Value Date   TRIG 231.0 (H) 12/21/2014   Lab Results  Component Value Date   CHOLHDL 5 12/21/2014   Lab Results  Component Value Date   HGBA1C 5.2 05/16/2016    IMPRESSION AND PLAN:  Fibromyalgia: intolerant to gabapentin plus no signif improvement on this med. Tough to tell if cymbalta helping (masked by side effects from gabapentin?). Will continue cymbalta, ween off gabapentin, and start lyrica trial. Instructions: Take 2 of the 100 mg at bedtime for 3d, then take 1 of the 100mg  gabapentin at bedtime x 3d, then stop. The next day, start the  lyrica.  An After Visit Summary was printed and given to the patient.  FOLLOW UP: Return in about 4 weeks (around 12/26/2016) for f/u fibromyalgia.  Signed:  Santiago Bumpers, MD           11/28/2016

## 2016-12-26 ENCOUNTER — Ambulatory Visit: Payer: BLUE CROSS/BLUE SHIELD | Admitting: Family Medicine

## 2017-01-09 ENCOUNTER — Encounter: Payer: Self-pay | Admitting: Family Medicine

## 2017-01-09 ENCOUNTER — Ambulatory Visit (INDEPENDENT_AMBULATORY_CARE_PROVIDER_SITE_OTHER): Payer: BLUE CROSS/BLUE SHIELD | Admitting: Family Medicine

## 2017-01-09 VITALS — BP 124/80 | HR 63 | Temp 97.9°F | Resp 16 | Ht 62.75 in | Wt 293.8 lb

## 2017-01-09 DIAGNOSIS — M797 Fibromyalgia: Secondary | ICD-10-CM | POA: Diagnosis not present

## 2017-01-09 MED ORDER — PREGABALIN 75 MG PO CAPS: ORAL_CAPSULE | ORAL | 1 refills | Status: DC

## 2017-01-09 NOTE — Progress Notes (Signed)
OFFICE VISIT  01/09/2017   CC:  Chief Complaint  Patient presents with  . Follow-up    Fibromyalgia    HPI:    Patient is a 56 y.o. Caucasian female who presents for 5 week f/u fibromyalgia syndrome. Last visit we weened her off of gabapentin b/c of lack of efficacy and relatively poor tolerability. Started lyrica 75mg  bid.  Kept her on cymbalta 60mg  qd.  Mild sedation on lyrica but not as bad as the neurontin. Feels like lyrica does help some with her baseline chronic/daily pain.      Past Medical History:  Diagnosis Date  . Allergy   . Asthma   . Fibromyalgia 2017  . Herniated disc L4-L5  . High triglycerides   . Hypertension   . Hypothyroidism   . IBS (irritable bowel syndrome)   . Internal hemorrhoids   . Neuromuscular disorder (HCC)   . Neuropathic pain    Rx'd neurontin by her orthopedist  . Sprain of posterior cruciate ligament of knee right knee    Past Surgical History:  Procedure Laterality Date  . COLONOSCOPY W/ POLYPECTOMY  09/2012   Dr. Loreta AveMann: diverticulosis.  Recall 5 yrs.  Marland Kitchen. TOTAL ABDOMINAL HYSTERECTOMY W/ BILATERAL SALPINGOOPHORECTOMY  approx 2006   Nonmalignant reasons    Outpatient Medications Prior to Visit  Medication Sig Dispense Refill  . calcium carbonate (TUMS - DOSED IN MG ELEMENTAL CALCIUM) 500 MG chewable tablet Chew 1 tablet by mouth as needed for indigestion or heartburn.    . DULoxetine (CYMBALTA) 60 MG capsule Take 1 capsule (60 mg total) by mouth daily. 30 capsule 3  . levothyroxine (SYNTHROID, LEVOTHROID) 150 MCG tablet TAKE 1 TABLET BY MOUTH EVERY DAY 30 tablet 12  . pregabalin (LYRICA) 75 MG capsule Take 1 capsule (75 mg total) by mouth 2 (two) times daily. 60 capsule 1   No facility-administered medications prior to visit.     Allergies  Allergen Reactions  . Nsaids Anaphylaxis  . Sulfa Antibiotics Rash  . Aspirin     Short of breath, nasal congestion    ROS As per HPI  PE: Blood pressure 124/80, pulse 63,  temperature 97.9 F (36.6 C), temperature source Oral, resp. rate 16, height 5' 2.75" (1.594 m), weight 293 lb 12 oz (133.2 kg), SpO2 96 %. Gen: Alert, well appearing.  Patient is oriented to person, place, time, and situation. AFFECT: pleasant, lucid thought and speech. No further exam today.  LABS:    Chemistry      Component Value Date/Time   NA 139 05/16/2016 1157   K 4.0 05/16/2016 1157   CL 107 05/16/2016 1157   CO2 27 05/16/2016 1157   BUN 16 05/16/2016 1157   CREATININE 0.81 05/16/2016 1157   CREATININE 0.68 10/05/2013 0833      Component Value Date/Time   CALCIUM 9.3 05/16/2016 1157   ALKPHOS 54 05/16/2016 1157   AST 15 05/16/2016 1157   ALT 16 05/16/2016 1157   BILITOT 0.4 05/16/2016 1157     Lab Results  Component Value Date   TSH 2.21 05/16/2016   IMPRESSION AND PLAN:  Fibromyalgia syndrome: some improvement is notable on 75mg  lyrica bid and she is tolerating this relatively well. Will try titrating up to 2 tabs bid of lyrica.  Continue cymbalta 60mg  qd.  An After Visit Summary was printed and given to the patient.  FOLLOW UP: Return in about 6 weeks (around 02/20/2017) for f/u fibromyalgia.  Signed:  Santiago BumpersPhil Abrahm Mancia, MD  01/09/2017     

## 2017-01-09 NOTE — Progress Notes (Signed)
Pre visit review using our clinic review tool, if applicable. No additional management support is needed unless otherwise documented below in the visit note. 

## 2017-01-12 ENCOUNTER — Other Ambulatory Visit: Payer: Self-pay

## 2017-01-12 MED ORDER — DULOXETINE HCL 60 MG PO CPEP: 60.0000 mg | ORAL_CAPSULE | Freq: Every day | ORAL | 2 refills | Status: DC

## 2017-01-12 NOTE — Telephone Encounter (Signed)
Refill on cymbalta sent to pharmacy.

## 2017-01-14 MED ORDER — DULOXETINE HCL 60 MG PO CPEP: 60.0000 mg | ORAL_CAPSULE | Freq: Every day | ORAL | 1 refills | Status: DC

## 2017-01-14 NOTE — Telephone Encounter (Addendum)
[facility] [place] [place] [facility].  RF request for duloxetine LOV: 01/09/17 Next ov: 02/20/17 Last written: 01/12/17 #30 w/ 2RF - [facility] [place]

## 2017-01-14 NOTE — Addendum Note (Signed)
Addended by: Smitty KnudsenSUTHERLAND, Amarion Portell K on: 01/14/2017 11:20 AM   Modules accepted: Orders

## 2017-02-06 DIAGNOSIS — E559 Vitamin D deficiency, unspecified: Secondary | ICD-10-CM

## 2017-02-06 HISTORY — DX: Vitamin D deficiency, unspecified: E55.9

## 2017-02-20 ENCOUNTER — Ambulatory Visit (INDEPENDENT_AMBULATORY_CARE_PROVIDER_SITE_OTHER): Payer: BLUE CROSS/BLUE SHIELD | Admitting: Family Medicine

## 2017-02-20 ENCOUNTER — Encounter: Payer: Self-pay | Admitting: Family Medicine

## 2017-02-20 VITALS — BP 137/88 | HR 74 | Temp 97.8°F | Resp 16 | Ht 62.75 in | Wt 293.5 lb

## 2017-02-20 DIAGNOSIS — G894 Chronic pain syndrome: Secondary | ICD-10-CM

## 2017-02-20 DIAGNOSIS — Z9189 Other specified personal risk factors, not elsewhere classified: Secondary | ICD-10-CM

## 2017-02-20 DIAGNOSIS — M797 Fibromyalgia: Secondary | ICD-10-CM | POA: Diagnosis not present

## 2017-02-20 DIAGNOSIS — R5382 Chronic fatigue, unspecified: Secondary | ICD-10-CM

## 2017-02-20 DIAGNOSIS — E063 Autoimmune thyroiditis: Secondary | ICD-10-CM

## 2017-02-20 DIAGNOSIS — E559 Vitamin D deficiency, unspecified: Secondary | ICD-10-CM

## 2017-02-20 DIAGNOSIS — Z1159 Encounter for screening for other viral diseases: Secondary | ICD-10-CM | POA: Diagnosis not present

## 2017-02-20 DIAGNOSIS — E038 Other specified hypothyroidism: Secondary | ICD-10-CM | POA: Diagnosis not present

## 2017-02-20 LAB — CBC WITH DIFFERENTIAL/PLATELET
BASOS PCT: 0.8 % (ref 0.0–3.0)
Basophils Absolute: 0 10*3/uL (ref 0.0–0.1)
EOS ABS: 0.3 10*3/uL (ref 0.0–0.7)
Eosinophils Relative: 5 % (ref 0.0–5.0)
HCT: 40.6 % (ref 36.0–46.0)
HEMOGLOBIN: 13.5 g/dL (ref 12.0–15.0)
Lymphocytes Relative: 30.8 % (ref 12.0–46.0)
Lymphs Abs: 1.8 10*3/uL (ref 0.7–4.0)
MCHC: 33.2 g/dL (ref 30.0–36.0)
MCV: 83.7 fl (ref 78.0–100.0)
MONO ABS: 0.4 10*3/uL (ref 0.1–1.0)
Monocytes Relative: 7.4 % (ref 3.0–12.0)
Neutro Abs: 3.3 10*3/uL (ref 1.4–7.7)
Neutrophils Relative %: 56 % (ref 43.0–77.0)
PLATELETS: 306 10*3/uL (ref 150.0–400.0)
RBC: 4.86 Mil/uL (ref 3.87–5.11)
RDW: 14 % (ref 11.5–15.5)
WBC: 5.9 10*3/uL (ref 4.0–10.5)

## 2017-02-20 LAB — COMPREHENSIVE METABOLIC PANEL
ALT: 15 U/L (ref 0–35)
AST: 16 U/L (ref 0–37)
Albumin: 4 g/dL (ref 3.5–5.2)
Alkaline Phosphatase: 54 U/L (ref 39–117)
BUN: 16 mg/dL (ref 6–23)
CHLORIDE: 108 meq/L (ref 96–112)
CO2: 30 mEq/L (ref 19–32)
Calcium: 9.4 mg/dL (ref 8.4–10.5)
Creatinine, Ser: 0.75 mg/dL (ref 0.40–1.20)
GFR: 85.24 mL/min (ref >60.00)
GLUCOSE: 88 mg/dL (ref 70–99)
POTASSIUM: 4.1 meq/L (ref 3.5–5.1)
SODIUM: 144 meq/L (ref 135–145)
TOTAL PROTEIN: 6.5 g/dL (ref 6.0–8.3)
Total Bilirubin: 0.5 mg/dL (ref 0.2–1.2)

## 2017-02-20 LAB — VITAMIN D 25 HYDROXY (VIT D DEFICIENCY, FRACTURES): VITD: 23.41 ng/mL — ABNORMAL LOW (ref 30.00–100.00)

## 2017-02-20 LAB — VITAMIN B12: Vitamin B-12: 244 pg/mL (ref 211–911)

## 2017-02-20 LAB — TSH: TSH: 0.95 u[IU]/mL (ref 0.35–4.50)

## 2017-02-20 MED ORDER — PREGABALIN 150 MG PO CAPS: 150.0000 mg | ORAL_CAPSULE | Freq: Two times a day (BID) | ORAL | 3 refills | Status: DC

## 2017-02-20 NOTE — Progress Notes (Signed)
OFFICE VISIT  02/20/2017   CC:  Chief Complaint  Patient presents with  . Follow-up    fibromyalgia   HPI:    Patient is a 56 y.o. Caucasian female who presents for 5 week f/u fibromyalgia. Last f/u visit we titrated lyrica up to 150mg  bid b/c she showed slight improvement in her widespread pain and only had mild sedation from the med.  We continued cymbalta 60 mg daily. She says she is much improved regarding her chronic fibromyalgia pain but not 100%.  Minimal sedation from lyrica.  She has a list of labs/tests that her daughter asked her to see if I could do for her: we have done autoimmune w/u as recently as 05/2016, so I won't repeat this at this time.  ROS: constipation.  Occ urgent postprandial BM--esp after strawberries, blueberries, corn. No joint swelling.  No rash.  NO fevers.  +chronic fatigue.   Past Medical History:  Diagnosis Date  . Allergy   . Asthma   . Fibromyalgia 2017  . Herniated disc L4-L5  . High triglycerides   . Hypertension   . Hypothyroidism   . IBS (irritable bowel syndrome)   . Internal hemorrhoids   . Neuromuscular disorder (HCC)   . Neuropathic pain    Rx'd neurontin by her orthopedist  . Sprain of posterior cruciate ligament of knee right knee    Past Surgical History:  Procedure Laterality Date  . COLONOSCOPY W/ POLYPECTOMY  09/2012   Dr. Loreta AveMann: diverticulosis.  Recall 5 yrs.  Marland Kitchen. TOTAL ABDOMINAL HYSTERECTOMY W/ BILATERAL SALPINGOOPHORECTOMY  approx 2006   Nonmalignant reasons    Outpatient Medications Prior to Visit  Medication Sig Dispense Refill  . calcium carbonate (TUMS - DOSED IN MG ELEMENTAL CALCIUM) 500 MG chewable tablet Chew 1 tablet by mouth as needed for indigestion or heartburn.    . DULoxetine (CYMBALTA) 60 MG capsule Take 1 capsule (60 mg total) by mouth daily. 90 capsule 1  . levothyroxine (SYNTHROID, LEVOTHROID) 150 MCG tablet TAKE 1 TABLET BY MOUTH EVERY DAY 30 tablet 12  . pregabalin (LYRICA) 75 MG capsule 1-2 tabs po  bid 120 capsule 1   No facility-administered medications prior to visit.     Allergies  Allergen Reactions  . Nsaids Anaphylaxis  . Sulfa Antibiotics Rash  . Aspirin     Short of breath, nasal congestion    ROS As per HPI  PE: Blood pressure 137/88, pulse 74, temperature 97.8 F (36.6 C), temperature source Oral, resp. rate 16, height 5' 2.75" (1.594 m), weight 293 lb 8 oz (133.1 kg), SpO2 93 %. Gen: Alert, well appearing.  Patient is oriented to person, place, time, and situation. AFFECT: pleasant, lucid thought and speech. No further exam today.  LABS:    Chemistry      Component Value Date/Time   NA 139 05/16/2016 1157   K 4.0 05/16/2016 1157   CL 107 05/16/2016 1157   CO2 27 05/16/2016 1157   BUN 16 05/16/2016 1157   CREATININE 0.81 05/16/2016 1157   CREATININE 0.68 10/05/2013 0833      Component Value Date/Time   CALCIUM 9.3 05/16/2016 1157   ALKPHOS 54 05/16/2016 1157   AST 15 05/16/2016 1157   ALT 16 05/16/2016 1157   BILITOT 0.4 05/16/2016 1157     Lab Results  Component Value Date   WBC 6.1 05/16/2016   HGB 13.8 05/16/2016   HCT 40.1 05/16/2016   MCV 83.1 05/16/2016   PLT 352.0 05/16/2016  Lab Results  Component Value Date   TSH 2.21 05/16/2016   Lab Results  Component Value Date   CHOL 195 12/21/2014   HDL 40.70 12/21/2014   LDLCALC 76 10/05/2013   LDLDIRECT 54.0 12/21/2014   TRIG 231.0 (H) 12/21/2014   CHOLHDL 5 12/21/2014    IMPRESSION AND PLAN:  1) Fibromyalgia: significantly improved. Pt agreeable to continuing current therapies. Will recheck a few baseline labs: CBC w/diff, CMET, TSH. Also check Vit b12 (chronic fatigue) and do hep C screening today. Hx of vit D deficiency: recheck vit D level today.  An After Visit Summary was printed and given to the patient.  FOLLOW UP: Return in about 3 months (around 05/23/2017) for annual CPE (fasting).  Signed:  Santiago Bumpers, MD           02/20/2017

## 2017-02-21 LAB — HEPATITIS C ANTIBODY: HCV Ab: NEGATIVE

## 2017-02-23 ENCOUNTER — Encounter: Payer: Self-pay | Admitting: Family Medicine

## 2017-02-23 LAB — LYME AB/WESTERN BLOT REFLEX

## 2017-03-02 ENCOUNTER — Other Ambulatory Visit: Payer: Self-pay | Admitting: *Deleted

## 2017-03-02 ENCOUNTER — Encounter: Payer: Self-pay | Admitting: *Deleted

## 2017-03-02 DIAGNOSIS — E559 Vitamin D deficiency, unspecified: Secondary | ICD-10-CM

## 2017-03-02 MED ORDER — VITAMIN D (ERGOCALCIFEROL) 1.25 MG (50000 UNIT) PO CAPS: 50000.0000 [IU] | ORAL_CAPSULE | ORAL | 0 refills | Status: DC

## 2017-03-17 ENCOUNTER — Encounter: Payer: Self-pay | Admitting: *Deleted

## 2017-05-22 ENCOUNTER — Other Ambulatory Visit: Payer: Self-pay | Admitting: Family Medicine

## 2017-05-22 NOTE — Telephone Encounter (Signed)
Left message for pt to call back  °

## 2017-05-22 NOTE — Telephone Encounter (Signed)
Pt advised and voiced understanding.   

## 2017-05-22 NOTE — Telephone Encounter (Signed)
[facility] [address]  RF request for Vitamin D LOV: 02/20/17 Next ov: 05/27/17 Last written: 03/02/17 #12 w/ 0RF  Please advise. Thanks.

## 2017-05-22 NOTE — Telephone Encounter (Signed)
I'm going to decline this high dose vit D for now. We'll recheck Vit D level at next visit on 05/27/17 and decide what dose she needs after that. For now, have her take TWO of the 1000 U vit D otc tabs every day.-thx

## 2017-05-27 ENCOUNTER — Encounter: Payer: BLUE CROSS/BLUE SHIELD | Admitting: Family Medicine

## 2017-05-27 NOTE — Progress Notes (Deleted)
Office Note 05/27/2017  CC: No chief complaint on file.   HPI:  Brenda Schro[address] is a 56 y.o. White female who is here for annual health maintenance exam.   Past Medical History:  Diagnosis Date  . Allergy   . Asthma   . Fibromyalgia 2017  . Herniated disc L4-L5  . High triglycerides   . Hypertension   . Hypothyroidism   . IBS (irritable bowel syndrome)   . Internal hemorrhoids   . Neuromuscular disorder (HCC)   . Neuropathic pain    Rx'd neurontin by her orthopedist  . Sprain of posterior cruciate ligament of knee right knee  . Vitamin D deficiency 02/2017   high dose vit D started 02/23/17    Past Surgical History:  Procedure Laterality Date  . COLONOSCOPY W/ POLYPECTOMY  09/2012   Dr. Loreta Ave: diverticulosis.  Recall 5 yrs.  Marland Kitchen TOTAL ABDOMINAL HYSTERECTOMY W/ BILATERAL SALPINGOOPHORECTOMY  approx 2006   Nonmalignant reasons    Family History  Problem Relation Age of Onset  . Arthritis Mother        osteo arithritis  . Obesity Mother   . Fibroids Mother        uterine  . Lymphoma Father   . Sarcoidosis Father   . Pulmonary fibrosis Father   . Arthritis Maternal Grandmother        rheumatoid  . Cancer Maternal Grandmother        cervical  . Heart disease Maternal Grandfather        a-fib  . COPD Maternal Grandfather        emphysema  . Vision loss Paternal Grandmother   . Emphysema Paternal Grandfather   . Lung cancer Paternal Grandfather   . Celiac disease Sister   . Asthma Son   . Cancer Daughter        ovarian  . Obesity Sister     Social History   Social History  . Marital status: Married    Spouse name: N/A  . Number of children: N/A  . Years of education: N/A   Occupational History  . Not on file.   Social History Main Topics  . Smoking status: Never Smoker  . Smokeless tobacco: Never Used  . Alcohol use No     Comment: rarely  . Drug use: No  . Sexual activity: Yes    Partners: Male    Birth control/ protection: None   Other  Topics Concern  . Not on file   Social History Narrative   Married 26 yrs, 3 children (1 daught, 2 sons).   [place] from [place].   Occupation: Works for [employer], does medication mgmt evals in NH/ALF's.   No tobacco, very rare alcohol, no drugs.             Outpatient Medications Prior to Visit  Medication Sig Dispense Refill  . calcium carbonate (TUMS - DOSED IN MG ELEMENTAL CALCIUM) 500 MG chewable tablet Chew 1 tablet by mouth as needed for indigestion or heartburn.    . DULoxetine (CYMBALTA) 60 MG capsule Take 1 capsule (60 mg total) by mouth daily. 90 capsule 1  . levothyroxine (SYNTHROID, LEVOTHROID) 150 MCG tablet TAKE 1 TABLET BY MOUTH EVERY DAY 30 tablet 12  . pregabalin (LYRICA) 150 MG capsule Take 1 capsule (150 mg total) by mouth 2 (two) times daily. 180 capsule 3  . Vitamin D, Ergocalciferol, (DRISDOL) 50000 units CAPS capsule Take 1 capsule (50,000 Units total) by mouth every 7 (seven) days.  12 capsule 0   No facility-administered medications prior to visit.     Allergies  Allergen Reactions  . Nsaids Anaphylaxis  . Sulfa Antibiotics Rash  . Aspirin     Short of breath, nasal congestion    ROS *** PE; There were no vitals taken for this visit. *** Pertinent labs:  Lab Results  Component Value Date   TSH 0.95 02/20/2017   Lab Results  Component Value Date   WBC 5.9 02/20/2017   HGB 13.5 02/20/2017   HCT 40.6 02/20/2017   MCV 83.7 02/20/2017   PLT 306.0 02/20/2017   Lab Results  Component Value Date   CREATININE 0.75 02/20/2017   BUN 16 02/20/2017   NA 144 02/20/2017   K 4.1 02/20/2017   CL 108 02/20/2017   CO2 30 02/20/2017   Lab Results  Component Value Date   ALT 15 02/20/2017   AST 16 02/20/2017   ALKPHOS 54 02/20/2017   BILITOT 0.5 02/20/2017   Lab Results  Component Value Date   CHOL 195 12/21/2014   Lab Results  Component Value Date   HDL 40.70 12/21/2014   Lab Results  Component Value Date   LDLCALC 76  10/05/2013   Lab Results  Component Value Date   TRIG 231.0 (H) 12/21/2014   Lab Results  Component Value Date   CHOLHDL 5 12/21/2014   Lab Results  Component Value Date   HGBA1C 5.2 05/16/2016   ASSESSMENT AND PLAN:   Health maintenance exam: Reviewed age and gender appropriate health maintenance issues (prudent diet, regular exercise, health risks of tobacco and excessive alcohol, use of seatbelts, fire alarms in home, use of sunscreen).  Also reviewed age and gender appropriate health screening as well as vaccine recommendations. Vaccines:  Labs: Breast ca screening: due for next mammogram 04/2017. Cerv ca screening: not a candidate for this due to remote hx of TAH for nonmalignant reasons. Colon ca screening: next colonoscopy 09/2017 (Dr. Loreta Ave).  An After Visit Summary was printed and given to the patient.  FOLLOW UP:  No Follow-up on file.  Signed:  Santiago Bumpers, MD           05/27/2017

## 2017-07-01 ENCOUNTER — Ambulatory Visit (INDEPENDENT_AMBULATORY_CARE_PROVIDER_SITE_OTHER): Payer: BLUE CROSS/BLUE SHIELD | Admitting: Family Medicine

## 2017-07-01 ENCOUNTER — Encounter: Payer: Self-pay | Admitting: Family Medicine

## 2017-07-01 VITALS — BP 139/90 | HR 65 | Temp 97.5°F | Resp 16 | Ht 62.75 in | Wt 299.5 lb

## 2017-07-01 DIAGNOSIS — Z0000 Encounter for general adult medical examination without abnormal findings: Secondary | ICD-10-CM | POA: Diagnosis not present

## 2017-07-01 DIAGNOSIS — Z1231 Encounter for screening mammogram for malignant neoplasm of breast: Secondary | ICD-10-CM

## 2017-07-01 DIAGNOSIS — E559 Vitamin D deficiency, unspecified: Secondary | ICD-10-CM | POA: Diagnosis not present

## 2017-07-01 DIAGNOSIS — Z23 Encounter for immunization: Secondary | ICD-10-CM | POA: Diagnosis not present

## 2017-07-01 DIAGNOSIS — Z1239 Encounter for other screening for malignant neoplasm of breast: Secondary | ICD-10-CM

## 2017-07-01 LAB — CBC WITH DIFFERENTIAL/PLATELET
BASOS ABS: 0 10*3/uL (ref 0.0–0.1)
Basophils Relative: 0.7 % (ref 0.0–3.0)
EOS ABS: 0.3 10*3/uL (ref 0.0–0.7)
EOS PCT: 5.9 % — AB (ref 0.0–5.0)
HCT: 42.5 % (ref 36.0–46.0)
Hemoglobin: 13.8 g/dL (ref 12.0–15.0)
Lymphocytes Relative: 33 % (ref 12.0–46.0)
Lymphs Abs: 1.8 10*3/uL (ref 0.7–4.0)
MCHC: 32.6 g/dL (ref 30.0–36.0)
MCV: 84.4 fl (ref 78.0–100.0)
MONO ABS: 0.4 10*3/uL (ref 0.1–1.0)
Monocytes Relative: 7.9 % (ref 3.0–12.0)
Neutro Abs: 2.9 10*3/uL (ref 1.4–7.7)
Neutrophils Relative %: 52.5 % (ref 43.0–77.0)
Platelets: 324 10*3/uL (ref 150.0–400.0)
RBC: 5.04 Mil/uL (ref 3.87–5.11)
RDW: 14.6 % (ref 11.5–15.5)
WBC: 5.4 10*3/uL (ref 4.0–10.5)

## 2017-07-01 LAB — LIPID PANEL
CHOLESTEROL: 196 mg/dL (ref 0–200)
HDL: 43.4 mg/dL (ref >39.00)
NonHDL: 152.31
TRIGLYCERIDES: 237 mg/dL — AB (ref 0.0–149.0)
Total CHOL/HDL Ratio: 5
VLDL: 47.4 mg/dL — ABNORMAL HIGH (ref 0.0–40.0)

## 2017-07-01 LAB — COMPREHENSIVE METABOLIC PANEL
ALT: 15 U/L (ref 0–35)
AST: 16 U/L (ref 0–37)
Albumin: 4 g/dL (ref 3.5–5.2)
Alkaline Phosphatase: 58 U/L (ref 39–117)
BILIRUBIN TOTAL: 0.5 mg/dL (ref 0.2–1.2)
BUN: 13 mg/dL (ref 6–23)
CALCIUM: 9.8 mg/dL (ref 8.4–10.5)
CO2: 32 mEq/L (ref 19–32)
CREATININE: 0.69 mg/dL (ref 0.40–1.20)
Chloride: 104 mEq/L (ref 96–112)
GFR: 93.73 mL/min (ref >60.00)
Glucose, Bld: 97 mg/dL (ref 70–99)
Potassium: 4.2 mEq/L (ref 3.5–5.1)
Sodium: 142 mEq/L (ref 135–145)
Total Protein: 6.7 g/dL (ref 6.0–8.3)

## 2017-07-01 LAB — LDL CHOLESTEROL, DIRECT: LDL DIRECT: 64 mg/dL

## 2017-07-01 LAB — VITAMIN D 25 HYDROXY (VIT D DEFICIENCY, FRACTURES): VITD: 25.2 ng/mL — ABNORMAL LOW (ref 30.00–100.00)

## 2017-07-01 LAB — TSH: TSH: 1.39 u[IU]/mL (ref 0.35–4.50)

## 2017-07-01 NOTE — Progress Notes (Signed)
Office Note 07/01/2017  CC:  Chief Complaint  Patient presents with  . Annual Exam    Pt is fasting.    HPI:  Brenda Schroeder is a 56 y.o. White female who is here for annual health maintenance exam.  Vit D def dx'd 02/2017 and started on high dose vit D replacement.  Eye exam: "it's been a few years". Dental: no preventatives in last 1-2 yrs. Exercise: No exercise at this time. Diet: cutting back on sweet tea.  More salads and yogurt.  Drinking more lemonade, though.  She is struggling/frustrated with obesity and continuing to gain wt.  Has not been able to exercise, partially b/c of chronic pain assoc with her fibromyalgia.  Also seems like her dietary changes are minimal. She is open to a consult with [facility] with Dr. Dalbert Garnet.  Past Medical History:  Diagnosis Date  . Allergy   . Asthma   . Fibromyalgia 2017  . Herniated disc L4-L5  . High triglycerides   . Hypertension   . Hypothyroidism   . IBS (irritable bowel syndrome)   . Internal hemorrhoids   . Neuromuscular disorder (HCC)   . Neuropathic pain    Rx'd neurontin by her orthopedist  . Sprain of posterior cruciate ligament of knee right knee  . Vitamin D deficiency 02/2017   high dose vit D started 02/23/17    Past Surgical History:  Procedure Laterality Date  . COLONOSCOPY W/ POLYPECTOMY  09/2012   Dr. Loreta Ave: diverticulosis.  Recall 5 yrs.  Marland Kitchen TOTAL ABDOMINAL HYSTERECTOMY W/ BILATERAL SALPINGOOPHORECTOMY  approx 2006   Nonmalignant reasons    Family History  Problem Relation Age of Onset  . Arthritis Mother        osteo arithritis  . Obesity Mother   . Fibroids Mother        uterine  . Lymphoma Father   . Sarcoidosis Father   . Pulmonary fibrosis Father   . Arthritis Maternal Grandmother        rheumatoid  . Cancer Maternal Grandmother        cervical  . Heart disease Maternal Grandfather        a-fib  . COPD Maternal Grandfather        emphysema  . Vision loss  Paternal Grandmother   . Emphysema Paternal Grandfather   . Lung cancer Paternal Grandfather   . Celiac disease Sister   . Asthma Son   . Cancer Daughter        ovarian  . Obesity Sister     Social History   Social History  . Marital status: Married    Spouse name: N/A  . Number of children: N/A  . Years of education: N/A   Occupational History  . Not on file.   Social History Main Topics  . Smoking status: Never Smoker  . Smokeless tobacco: Never Used  . Alcohol use No     Comment: rarely  . Drug use: No  . Sexual activity: Yes    Partners: Male    Birth control/ protection: None   Other Topics Concern  . Not on file   Social History Narrative   Married 26 yrs, 3 children (1 daught, 2 sons).   [place].   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   No tobacco, very rare alcohol, no drugs.             Outpatient Medications Prior to  Visit  Medication Sig Dispense Refill  . calcium carbonate (TUMS - DOSED IN MG ELEMENTAL CALCIUM) 500 MG chewable tablet Chew 1 tablet by mouth as needed for indigestion or heartburn.    . DULoxetine (CYMBALTA) 60 MG capsule Take 1 capsule (60 mg total) by mouth daily. 90 capsule 1  . levothyroxine (SYNTHROID, LEVOTHROID) 150 MCG tablet TAKE 1 TABLET BY MOUTH EVERY DAY 30 tablet 12  . pregabalin (LYRICA) 150 MG capsule Take 1 capsule (150 mg total) by mouth 2 (two) times daily. 180 capsule 3  . Vitamin D, Ergocalciferol, (DRISDOL) 50000 units CAPS capsule Take 1 capsule (50,000 Units total) by mouth every 7 (seven) days. (Patient not taking: Reported on 07/01/2017) 12 capsule 0   No facility-administered medications prior to visit.     Allergies  Allergen Reactions  . Nsaids Anaphylaxis  . Sulfa Antibiotics Rash  . Aspirin     Short of breath, nasal congestion    ROS Review of Systems  Constitutional: Negative for appetite change, chills, fatigue and fever.  HENT:  Negative for congestion, dental problem, ear pain and sore throat.   Eyes: Negative for discharge, redness and visual disturbance.  Respiratory: Negative for cough, chest tightness, shortness of breath and wheezing.   Cardiovascular: Negative for chest pain, palpitations and leg swelling.  Gastrointestinal: Negative for abdominal pain, blood in stool, diarrhea, nausea and vomiting.  Genitourinary: Negative for difficulty urinating, dysuria, flank pain, frequency, hematuria and urgency.  Musculoskeletal: Negative for arthralgias, back pain, joint swelling, myalgias and neck stiffness.  Skin: Negative for pallor and rash.  Neurological: Negative for dizziness, speech difficulty, weakness and headaches.  Hematological: Negative for adenopathy. Does not bruise/bleed easily.  Psychiatric/Behavioral: Negative for confusion and sleep disturbance. The patient is not nervous/anxious.     PE; Blood pressure 139/90, pulse 65, temperature (!) 97.5 F (36.4 C), temperature source Oral, resp. rate 16, height 5' 2.75" (1.594 m), weight 299 lb 8 oz (135.9 kg), SpO2 96 %. Body mass index is 53.48 kg/m. Pt examined with Wallace Keller, CMA, as chaperone.  Gen: Alert, well appearing.  Patient is oriented to person, place, time, and situation. AFFECT: pleasant, lucid thought and speech. ENT: Ears: EACs clear, normal epithelium.  TMs with good light reflex and landmarks bilaterally.  Eyes: no injection, icteris, swelling, or exudate.  EOMI, PERRLA. Nose: no drainage or turbinate edema/swelling.  No injection or focal lesion.  Mouth: lips without lesion/swelling.  Oral mucosa pink and moist.  Dentition intact and without obvious caries or gingival swelling.  Oropharynx without erythema, exudate, or swelling.  Neck: supple/nontender.  No LAD, mass, or TM.  Carotid pulses 2+ bilaterally, without bruits. CV: RRR, no m/r/g.   LUNGS: CTA bilat, nonlabored resps, good aeration in all lung fields. ABD: soft, NT, ND, BS  normal.  No hepatospenomegaly or mass.  No bruits. EXT: no clubbing, cyanosis, or edema.  Musculoskeletal: no joint swelling, erythema, warmth, or tenderness.  ROM of all joints intact. Skin - no sores or suspicious lesions or rashes or color changes   Pertinent labs:  Lab Results  Component Value Date   TSH 0.95 02/20/2017   Lab Results  Component Value Date   WBC 5.9 02/20/2017   HGB 13.5 02/20/2017   HCT 40.6 02/20/2017   MCV 83.7 02/20/2017   PLT 306.0 02/20/2017   Lab Results  Component Value Date   CREATININE 0.75 02/20/2017   BUN 16 02/20/2017   NA 144 02/20/2017   K 4.1 02/20/2017  CL 108 02/20/2017   CO2 30 02/20/2017   Lab Results  Component Value Date   ALT 15 02/20/2017   AST 16 02/20/2017   ALKPHOS 54 02/20/2017   BILITOT 0.5 02/20/2017   Lab Results  Component Value Date   CHOL 195 12/21/2014   Lab Results  Component Value Date   HDL 40.70 12/21/2014   Lab Results  Component Value Date   LDLCALC 76 10/05/2013   Lab Results  Component Value Date   TRIG 231.0 (H) 12/21/2014   Lab Results  Component Value Date   CHOLHDL 5 12/21/2014   Lab Results  Component Value Date   HGBA1C 5.2 05/16/2016    ASSESSMENT AND PLAN:   Health maintenance exam: Reviewed age and gender appropriate health maintenance issues (prudent diet, regular exercise, health risks of tobacco and excessive alcohol, use of seatbelts, fire alarms in home, use of sunscreen).  Also reviewed age and gender appropriate health screening as well as vaccine recommendations. Vaccines: UTD.  Flu vaccine today.  Shingrix--#1 today. Labs: fasting HP + vit D (vit D def). Breast ca screening: last mammo was 04/2016--she is due for this--ordered today.  [facility] de[facility], [facility]). Cerv ca screening: not a candidate b/c she is s/p hysterectomy for benign reasons. Colon ca screening: next colonoscopy due 09/2017. Encouraged pt to get UTD with eye exam and dental preventative exams.  Vit D  def: she is s/p high dose vit D replacement and we'll recheck her vit D level today.  Obesity: [facility]  An After Visit Summary was printed and given to the patient.  FOLLOW UP:  Return in about 6 months (around 12/30/2017) for routine chronic illness f/u.  Signed:  Santiago BumpersPhil Brycin Kille, MD           07/01/2017

## 2017-07-01 NOTE — Addendum Note (Signed)
Addended by: Smitty KnudsenSUTHERLAND, Finneas Mathe K on: 07/01/2017 09:57 AM   Modules accepted: Orders

## 2017-07-01 NOTE — Patient Instructions (Signed)

## 2017-07-02 ENCOUNTER — Encounter: Payer: Self-pay | Admitting: Family Medicine

## 2017-07-02 ENCOUNTER — Other Ambulatory Visit: Payer: Self-pay | Admitting: Family Medicine

## 2017-07-02 MED ORDER — VITAMIN D (ERGOCALCIFEROL) 1.25 MG (50000 UNIT) PO CAPS: 50000.0000 [IU] | ORAL_CAPSULE | ORAL | 3 refills | Status: DC

## 2017-07-17 DIAGNOSIS — Z1231 Encounter for screening mammogram for malignant neoplasm of breast: Secondary | ICD-10-CM | POA: Diagnosis not present

## 2017-07-17 LAB — HM MAMMOGRAPHY

## 2017-08-03 ENCOUNTER — Other Ambulatory Visit: Payer: Self-pay | Admitting: *Deleted

## 2017-08-03 MED ORDER — LEVOTHYROXINE SODIUM 150 MCG PO TABS: ORAL_TABLET | ORAL | 1 refills | Status: DC

## 2017-08-03 MED ORDER — DULOXETINE HCL 60 MG PO CPEP: 60.0000 mg | ORAL_CAPSULE | Freq: Every day | ORAL | 1 refills | Status: DC

## 2017-08-20 ENCOUNTER — Telehealth: Payer: Self-pay | Admitting: Family Medicine

## 2017-08-20 NOTE — Telephone Encounter (Signed)
Following up with patient for mammo order. [facility].

## 2017-08-20 NOTE — Telephone Encounter (Signed)
Record request faxed to [facility]

## 2017-08-24 ENCOUNTER — Encounter: Payer: Self-pay | Admitting: Family Medicine

## 2017-09-04 ENCOUNTER — Ambulatory Visit: Payer: BLUE CROSS/BLUE SHIELD

## 2017-09-10 ENCOUNTER — Other Ambulatory Visit: Payer: Self-pay | Admitting: Family Medicine

## 2017-09-11 ENCOUNTER — Other Ambulatory Visit: Payer: Self-pay | Admitting: Family Medicine

## 2017-09-11 ENCOUNTER — Ambulatory Visit (INDEPENDENT_AMBULATORY_CARE_PROVIDER_SITE_OTHER): Payer: BLUE CROSS/BLUE SHIELD | Admitting: Family Medicine

## 2017-09-11 DIAGNOSIS — Z23 Encounter for immunization: Secondary | ICD-10-CM | POA: Diagnosis not present

## 2017-09-11 MED ORDER — PREGABALIN 150 MG PO CAPS: 150.0000 mg | ORAL_CAPSULE | Freq: Two times a day (BID) | ORAL | 3 refills | Status: DC

## 2017-09-11 NOTE — Telephone Encounter (Signed)
Fax down, [facility]. Note: amount of RF's was changed to 1RF, was given v/o to change.

## 2017-12-01 DIAGNOSIS — Z8379 Family history of other diseases of the digestive system: Secondary | ICD-10-CM | POA: Diagnosis not present

## 2017-12-01 DIAGNOSIS — K219 Gastro-esophageal reflux disease without esophagitis: Secondary | ICD-10-CM | POA: Diagnosis not present

## 2017-12-30 ENCOUNTER — Ambulatory Visit: Payer: BLUE CROSS/BLUE SHIELD | Admitting: Family Medicine

## 2018-01-01 ENCOUNTER — Ambulatory Visit: Payer: BLUE CROSS/BLUE SHIELD | Admitting: Family Medicine

## 2018-01-01 ENCOUNTER — Encounter: Payer: Self-pay | Admitting: Family Medicine

## 2018-01-01 VITALS — BP 130/78 | HR 76 | Temp 97.8°F | Resp 16 | Ht 62.75 in | Wt 298.2 lb

## 2018-01-01 DIAGNOSIS — E559 Vitamin D deficiency, unspecified: Secondary | ICD-10-CM

## 2018-01-01 DIAGNOSIS — E039 Hypothyroidism, unspecified: Secondary | ICD-10-CM | POA: Diagnosis not present

## 2018-01-01 DIAGNOSIS — M797 Fibromyalgia: Secondary | ICD-10-CM | POA: Diagnosis not present

## 2018-01-01 LAB — TSH: TSH: 2.93 u[IU]/mL (ref 0.35–4.50)

## 2018-01-01 LAB — VITAMIN D 25 HYDROXY (VIT D DEFICIENCY, FRACTURES): VITD: 28.04 ng/mL — AB (ref 30.00–100.00)

## 2018-01-01 LAB — T4, FREE: FREE T4: 0.77 ng/dL (ref 0.60–1.60)

## 2018-01-01 MED ORDER — PREGABALIN 75 MG PO CAPS: ORAL_CAPSULE | ORAL | 5 refills | Status: DC

## 2018-01-01 NOTE — Progress Notes (Signed)
OFFICE VISIT  01/01/2018   CC:  Chief Complaint  Patient presents with  . Follow-up    RCI, pt is not fasting.      HPI:    Patient is a 57 y.o. Caucasian female who presents for f/u fibromyalgia, hypothyroidism, vit D deficiency.  Vit D: level was still < 30 at last check 6 mo ago.  I've kept her on 50K U vit D q week indefinitely.  Still with chronic fatigue, still with chronic widespread body achiness.  Recently added a thyroid supplement called thyroid energy about 3 mo ago.  I looked at this bottle and did not see anything that would cause problems, but will go ahead and monitor TSH, free T4, and T3 today.  Maybe helping a little, not much.  Taking cymbalta and lyrica as  Rx'd. Exercise: getting outside more, doing more light yardwork.  Also going to get started on exercise videos. EAts light/healthy lunch, no snacks.  Other meals not good.  Hand numbness in digits 3-5 when holding steering wheel a while. Right hand similar sx's but more random.  Shaking hands/wrists for 20 sec helps this resolve.  No color changes of hands.  No fevers, no joint swelling, no rashes.  +brain fog waxes and wanes.  No signif depression.  No panic attacks.  Past Medical History:  Diagnosis Date  . Allergy   . Asthma   . Fibromyalgia 2017  . Herniated disc L4-L5  . High triglycerides    mild.  TLC.  Marland Kitchen. Hypertension   . Hypothyroidism   . IBS (irritable bowel syndrome)   . Internal hemorrhoids   . Neuromuscular disorder (HCC)   . Neuropathic pain    Rx'd neurontin by her orthopedist  . Sprain of posterior cruciate ligament of knee right knee  . Vitamin D deficiency 02/2017   high dose vit D started 02/23/17--vit D still low on recheck.  Needs 50K q week dosing indefinitely.    Past Surgical History:  Procedure Laterality Date  . COLONOSCOPY W/ POLYPECTOMY  09/2012   Dr. Loreta AveMann: diverticulosis.  Recall 5 yrs.  Marland Kitchen. TOTAL ABDOMINAL HYSTERECTOMY W/ BILATERAL SALPINGOOPHORECTOMY  approx 2006   Nonmalignant reasons    Outpatient Medications Prior to Visit  Medication Sig Dispense Refill  . calcium carbonate (TUMS - DOSED IN MG ELEMENTAL CALCIUM) 500 MG chewable tablet Chew 1 tablet by mouth as needed for indigestion or heartburn.    . DULoxetine (CYMBALTA) 60 MG capsule Take 1 capsule (60 mg total) by mouth daily. 90 capsule 1  . levothyroxine (SYNTHROID, LEVOTHROID) 150 MCG tablet TAKE 1 TABLET BY MOUTH EVERY DAY 90 tablet 1  . OVER THE COUNTER MEDICATION Thyroid Energy - 1 cap BID    . OVER THE COUNTER MEDICATION Cytokine Suppress - 1 cap daily    . pregabalin (LYRICA) 150 MG capsule Take 1 capsule (150 mg total) by mouth 2 (two) times daily. 180 capsule 3  . Vitamin D, Ergocalciferol, (DRISDOL) 50000 units CAPS capsule Take 1 capsule (50,000 Units total) by mouth every 7 (seven) days. 36 capsule 3   No facility-administered medications prior to visit.     Allergies  Allergen Reactions  . Nsaids Anaphylaxis  . Sulfa Antibiotics Rash  . Aspirin     Short of breath, nasal congestion    ROS As per HPI  PE: Blood pressure 130/78, pulse 76, temperature 97.8 F (36.6 C), temperature source Oral, resp. rate 16, height 5' 2.75" (1.594 m), weight 298 lb 4 oz (  135.3 kg), SpO2 94 %. Body mass index is 53.25 kg/m.  Gen: Alert, well appearing.  Patient is oriented to person, place, time, and situation. AFFECT: pleasant, lucid thought and speech. No further exam today.  LABS:    Chemistry      Component Value Date/Time   NA 142 07/01/2017 0945   K 4.2 07/01/2017 0945   CL 104 07/01/2017 0945   CO2 32 07/01/2017 0945   BUN 13 07/01/2017 0945   CREATININE 0.69 07/01/2017 0945   CREATININE 0.68 10/05/2013 0833      Component Value Date/Time   CALCIUM 9.8 07/01/2017 0945   ALKPHOS 58 07/01/2017 0945   AST 16 07/01/2017 0945   ALT 15 07/01/2017 0945   BILITOT 0.5 07/01/2017 0945     Lab Results  Component Value Date   CHOL 196 07/01/2017   HDL 43.40 07/01/2017    LDLCALC 76 10/05/2013   LDLDIRECT 64.0 07/01/2017   TRIG 237.0 (H) 07/01/2017   CHOLHDL 5 07/01/2017   Lab Results  Component Value Date   HGBA1C 5.2 05/16/2016   Lab Results  Component Value Date   TSH 1.39 07/01/2017    IMPRESSION AND PLAN:  1) Fibromyalgia syndrome: she is mild/mod improved on current med regimen. She is trying to increase activity level/exercise.   Will increase lyrica to 225mg  bid today and see how she does. Continue cymbalta 60mg  qd.  2) Hypothyroidism: stable TSH historically, but with recent addition of an OTC thyroid supplement 3 mo ago I will check TSH, free T4, and T3 total.  3) Vit D def: taking 50K U vit D q week long term. Recheck Vit D today and if level not >40 then I'll increase her vit D 50K U to twice weekly.  An After Visit Summary was printed and given to the patient.  FOLLOW UP: Return in about 6 months (around 07/03/2018) for annual CPE (fasting).  Signed:  Santiago Bumpers, MD           01/01/2018

## 2018-01-02 LAB — T3: T3 TOTAL: 84 ng/dL (ref 76–181)

## 2018-01-03 ENCOUNTER — Encounter: Payer: Self-pay | Admitting: Family Medicine

## 2018-01-04 ENCOUNTER — Encounter: Payer: Self-pay | Admitting: Family Medicine

## 2018-01-05 ENCOUNTER — Other Ambulatory Visit: Payer: Self-pay

## 2018-01-05 MED ORDER — VITAMIN D (ERGOCALCIFEROL) 1.25 MG (50000 UNIT) PO CAPS: ORAL_CAPSULE | ORAL | 3 refills | Status: DC

## 2018-02-01 ENCOUNTER — Other Ambulatory Visit: Payer: Self-pay | Admitting: Family Medicine

## 2018-03-22 ENCOUNTER — Other Ambulatory Visit: Payer: Self-pay | Admitting: Family Medicine

## 2018-03-23 NOTE — Telephone Encounter (Signed)
[facility]  RF request for lyrica LOV: 01/01/18 Next ov: 07/02/18 Last written: 09/11/17 #180 w/ 1Rf (see refill note dated 09/11/17)  Please advise. Thanks.

## 2018-06-01 ENCOUNTER — Telehealth: Payer: Self-pay | Admitting: Family Medicine

## 2018-06-01 NOTE — Telephone Encounter (Signed)
Patient's husband(James Earl Galasborne) dropped off [employer] job Occupational hygienistapplicant Declaration of Health form on behalf of patient today requesting form to be completed.  He states [place] will [place] in the [place] area for a couple hours before heading back home to [place].  If possible he would like to pick form up before he heads back to [place].  Charge sheet generated, attached to form and placed on Heather's desk.

## 2018-06-01 NOTE — Telephone Encounter (Signed)
Form given to pts husband.   Copy made for chart.

## 2018-06-01 NOTE — Telephone Encounter (Signed)
Form placed on Dr. McGowen's desk for review. 

## 2018-06-01 NOTE — Telephone Encounter (Signed)
Form completed, [facility].

## 2018-06-28 LAB — TB SKIN TEST
INDURATION: 0 mm
TB Skin Test: NEGATIVE

## 2018-06-29 ENCOUNTER — Encounter: Payer: Self-pay | Admitting: Family Medicine

## 2018-07-02 ENCOUNTER — Encounter: Payer: Self-pay | Admitting: Family Medicine

## 2018-07-02 ENCOUNTER — Ambulatory Visit (INDEPENDENT_AMBULATORY_CARE_PROVIDER_SITE_OTHER): Payer: BLUE CROSS/BLUE SHIELD | Admitting: Family Medicine

## 2018-07-02 VITALS — BP 123/80 | HR 72 | Temp 97.7°F | Resp 16 | Ht 63.0 in | Wt 302.0 lb

## 2018-07-02 DIAGNOSIS — I10 Essential (primary) hypertension: Secondary | ICD-10-CM | POA: Diagnosis not present

## 2018-07-02 DIAGNOSIS — Z23 Encounter for immunization: Secondary | ICD-10-CM

## 2018-07-02 DIAGNOSIS — E039 Hypothyroidism, unspecified: Secondary | ICD-10-CM

## 2018-07-02 DIAGNOSIS — E559 Vitamin D deficiency, unspecified: Secondary | ICD-10-CM | POA: Diagnosis not present

## 2018-07-02 DIAGNOSIS — Z1239 Encounter for other screening for malignant neoplasm of breast: Secondary | ICD-10-CM

## 2018-07-02 DIAGNOSIS — Z0000 Encounter for general adult medical examination without abnormal findings: Secondary | ICD-10-CM

## 2018-07-02 NOTE — Patient Instructions (Signed)

## 2018-07-02 NOTE — Progress Notes (Signed)
Office Note 07/02/2018  CC:  Chief Complaint  Patient presents with  . Annual Exam    Pt is not fasting.     HPI:  Brenda Schroeder is a 57 y.o. White female who is here accompanied by her husband for annual health maintenance exam. Feeling fine, no acute complaints  Exercise/diet: has a plan in place but has yet to implement it. Eyes; exam < 2 yrs ago. Dental: preventatives UTD.   Past Medical History:  Diagnosis Date  . Allergy   . Asthma   . Fibromyalgia 2017  . Herniated disc L4-L5  . High triglycerides    mild.  TLC.  Marland Kitchen Hypertension   . Hypothyroidism   . IBS (irritable bowel syndrome)   . Internal hemorrhoids   . Neuromuscular disorder (HCC)   . Neuropathic pain    Rx'd neurontin by her orthopedist  . Sprain of posterior cruciate ligament of knee right knee  . Vitamin D deficiency 02/2017   high dose vit D started 02/23/17--vit D still low on recheck.  Needs 50K twice per week dosing indefinitely.    Past Surgical History:  Procedure Laterality Date  . COLONOSCOPY W/ POLYPECTOMY  09/2012   Dr. Loreta Ave: diverticulosis.  Recall 2024.  Marland Kitchen TOTAL ABDOMINAL HYSTERECTOMY W/ BILATERAL SALPINGOOPHORECTOMY  approx 2006   Nonmalignant reasons    Family History  Problem Relation Age of Onset  . Arthritis Mother        osteo arithritis  . Obesity Mother   . Fibroids Mother        uterine  . Lymphoma Father   . Sarcoidosis Father   . Pulmonary fibrosis Father   . Arthritis Maternal Grandmother        rheumatoid  . Cancer Maternal Grandmother        cervical  . Heart disease Maternal Grandfather        a-fib  . COPD Maternal Grandfather        emphysema  . Vision loss Paternal Grandmother   . Emphysema Paternal Grandfather   . Lung cancer Paternal Grandfather   . Celiac disease Sister   . Asthma Son   . Cancer Daughter        ovarian  . Obesity Sister     Social History   Socioeconomic History  . Marital status: Married    Spouse name: Not on file   . Number of children: Not on file  . Years of education: Not on file  . Highest education level: Not on file  Occupational History  . Not on file  Social Needs  . Financial resource strain: Not on file  . Food insecurity:    Worry: Not on file    Inability: Not on file  . Transportation needs:    Medical: Not on file    Non-medical: Not on file  Tobacco Use  . Smoking status: Never Smoker  . Smokeless tobacco: Never Used  Substance and Sexual Activity  . Alcohol use: No    Comment: rarely  . Drug use: No  . Sexual activity: Yes    Partners: Male    Birth control/protection: None  Lifestyle  . Physical activity:    Days per week: Not on file    Minutes per session: Not on file  . Stress: Not on file  Relationships  . Social connections:    Talks on phone: Not on file    Gets together: Not on file    Attends religious service: Not on file  Active member of club or organization: Not on file    Attends meetings of clubs or organizations: Not on file    Relationship status: Not on file  . Intimate partner violence:    Fear of current or ex partner: Not on file    Emotionally abused: Not on file    Physically abused: Not on file    Forced sexual activity: Not on file  Other Topics Concern  . Not on file  Social History Narrative   Married 26 yrs, 3 children (1 daught, 2 sons).   [place]ig from [place].   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   No tobacco, very rare alcohol, no drugs.          Outpatient Medications Prior to Visit  Medication Sig Dispense Refill  . calcium carbonate (TUMS - DOSED IN MG ELEMENTAL CALCIUM) 500 MG chewable tablet Chew 1 tablet by mouth as needed for indigestion or heartburn.    . DULoxetine (CYMBALTA) 60 MG capsule TAKE 1 CAPSULE BY MOUTH EVERY DAY 90 capsule 1  . levothyroxine (SYNTHROID, LEVOTHROID) 150 MCG tablet TAKE 1 TABLET BY MOUTH EVERY DAY 90 tablet 1  . LYRICA 150 MG capsule TAKE 1  CAPSULE BY MOUTH TWICE A DAY 180 capsule 1  . Vitamin D, Ergocalciferol, (DRISDOL) 50000 units CAPS capsule Take one capsule twice a week 24 capsule 3  . OVER THE COUNTER MEDICATION Thyroid Energy - 1 cap BID    . OVER THE COUNTER MEDICATION Cytokine Suppress - 1 cap daily    . pregabalin (LYRICA) 75 MG capsule Take 1 cap po bid (to be taken with lyrica 150mg  bid) 60 capsule 5   No facility-administered medications prior to visit.     Allergies  Allergen Reactions  . Nsaids Anaphylaxis  . Sulfa Antibiotics Rash  . Aspirin     Short of breath, nasal congestion    ROS Review of Systems  Constitutional: Negative for appetite change, chills, fatigue and fever.  HENT: Negative for congestion, dental problem, ear pain and sore throat.   Eyes: Negative for discharge, redness and visual disturbance.  Respiratory: Negative for cough, chest tightness, shortness of breath and wheezing.   Cardiovascular: Negative for chest pain, palpitations and leg swelling.  Gastrointestinal: Negative for abdominal pain, blood in stool, diarrhea, nausea and vomiting.  Genitourinary: Negative for difficulty urinating, dysuria, flank pain, frequency, hematuria and urgency.  Musculoskeletal: Negative for arthralgias, back pain, joint swelling, myalgias and neck stiffness.  Skin: Negative for pallor and rash.  Neurological: Negative for dizziness, speech difficulty, weakness and headaches.  Hematological: Negative for adenopathy. Does not bruise/bleed easily.  Psychiatric/Behavioral: Negative for confusion and sleep disturbance. The patient is not nervous/anxious.     PE; Blood pressure 123/80, pulse 72, temperature 97.7 F (36.5 C), temperature source Oral, resp. rate 16, height 5\' 3"  (1.6 m), weight (!) 302 lb (137 kg), SpO2 95 %. Gen: Alert, well appearing.  Patient is oriented to person, place, time, and situation. AFFECT: pleasant, lucid thought and speech. ENT: Ears: EACs clear, normal epithelium.  TMs  with good light reflex and landmarks bilaterally.  Eyes: no injection, icteris, swelling, or exudate.  EOMI, PERRLA. Nose: no drainage or turbinate edema/swelling.  No injection or focal lesion.  Mouth: lips without lesion/swelling.  Oral mucosa pink and moist.  Dentition intact and without obvious caries or gingival swelling.  Oropharynx without erythema, exudate, or swelling.  Neck: supple/nontender.  No LAD, mass, or  TM.  Carotid pulses 2+ bilaterally, without bruits. CV: RRR, no m/r/g.   LUNGS: CTA bilat, nonlabored resps, good aeration in all lung fields. ABD: soft, NT, ND, BS normal.  No hepatospenomegaly or mass.  No bruits. EXT: no clubbing, cyanosis, or edema.  Musculoskeletal: no joint swelling, erythema, warmth, or tenderness.  ROM of all joints intact. Skin - no sores or suspicious lesions or rashes or color changes   Pertinent labs:  Lab Results  Component Value Date   TSH 2.93 01/01/2018   Lab Results  Component Value Date   WBC 5.4 07/01/2017   HGB 13.8 07/01/2017   HCT 42.5 07/01/2017   MCV 84.4 07/01/2017   PLT 324.0 07/01/2017   Lab Results  Component Value Date   CREATININE 0.69 07/01/2017   BUN 13 07/01/2017   NA 142 07/01/2017   K 4.2 07/01/2017   CL 104 07/01/2017   CO2 32 07/01/2017   Lab Results  Component Value Date   ALT 15 07/01/2017   AST 16 07/01/2017   ALKPHOS 58 07/01/2017   BILITOT 0.5 07/01/2017   Lab Results  Component Value Date   CHOL 196 07/01/2017   Lab Results  Component Value Date   HDL 43.40 07/01/2017   Lab Results  Component Value Date   LDLCALC 76 10/05/2013   Lab Results  Component Value Date   TRIG 237.0 (H) 07/01/2017   Lab Results  Component Value Date   CHOLHDL 5 07/01/2017   Lab Results  Component Value Date   HGBA1C 5.2 05/16/2016    ASSESSMENT AND PLAN:   Health maintenance exam: Reviewed age and gender appropriate health maintenance issues (prudent diet, regular exercise, health risks of tobacco  and excessive alcohol, use of seatbelts, fire alarms in home, use of sunscreen).  Also reviewed age and gender appropriate health screening as well as vaccine recommendations. Vaccines:  Flu vaccine--> given today.  Pt is otherwise all UTD. Labs: fasting HP + vit D (vit D def)--ordered future b/c pt not fasting today. Cervical ca screening:  Pt is s/p hysterectomy for benign dx-->pt not candidate for cervical ca screening. Breast ca screening: 07/16/17 mammo normal--> ordered [facility] for [facility] today. Colon ca screening:  Next colonoscopy due 2024.  An After Visit Summary was printed and given to the patient.  FOLLOW UP:  Return in about 6 months (around 01/01/2019) for routine chronic illness f/u.  Also needs fasting lab appt at her earliest convenience..  Signed:  Santiago Bumpers, MD           07/02/2018

## 2018-07-09 ENCOUNTER — Other Ambulatory Visit (INDEPENDENT_AMBULATORY_CARE_PROVIDER_SITE_OTHER): Payer: BLUE CROSS/BLUE SHIELD

## 2018-07-09 DIAGNOSIS — E559 Vitamin D deficiency, unspecified: Secondary | ICD-10-CM

## 2018-07-09 DIAGNOSIS — I10 Essential (primary) hypertension: Secondary | ICD-10-CM

## 2018-07-09 DIAGNOSIS — E039 Hypothyroidism, unspecified: Secondary | ICD-10-CM

## 2018-07-09 LAB — LIPID PANEL
CHOLESTEROL: 250 mg/dL — AB (ref 0–200)
HDL: 40.7 mg/dL (ref >39.00)
NonHDL: 208.82
TRIGLYCERIDES: 350 mg/dL — AB (ref 0.0–149.0)
Total CHOL/HDL Ratio: 6
VLDL: 70 mg/dL — ABNORMAL HIGH (ref 0.0–40.0)

## 2018-07-09 LAB — CBC WITH DIFFERENTIAL/PLATELET
Basophils Absolute: 0.1 10*3/uL (ref 0.0–0.1)
Basophils Relative: 1.2 % (ref 0.0–3.0)
Eosinophils Absolute: 0.3 10*3/uL (ref 0.0–0.7)
Eosinophils Relative: 5.6 % — ABNORMAL HIGH (ref 0.0–5.0)
HEMATOCRIT: 42 % (ref 36.0–46.0)
Hemoglobin: 13.8 g/dL (ref 12.0–15.0)
LYMPHS PCT: 37.8 % (ref 12.0–46.0)
Lymphs Abs: 1.9 10*3/uL (ref 0.7–4.0)
MCHC: 33 g/dL (ref 30.0–36.0)
MCV: 84.3 fl (ref 78.0–100.0)
MONOS PCT: 8.5 % (ref 3.0–12.0)
Monocytes Absolute: 0.4 10*3/uL (ref 0.1–1.0)
NEUTROS ABS: 2.3 10*3/uL (ref 1.4–7.7)
Neutrophils Relative %: 46.9 % (ref 43.0–77.0)
PLATELETS: 304 10*3/uL (ref 150.0–400.0)
RBC: 4.98 Mil/uL (ref 3.87–5.11)
RDW: 14.7 % (ref 11.5–15.5)
WBC: 4.9 10*3/uL (ref 4.0–10.5)

## 2018-07-09 LAB — COMPREHENSIVE METABOLIC PANEL
ALT: 17 U/L (ref 0–35)
AST: 19 U/L (ref 0–37)
Albumin: 4.2 g/dL (ref 3.5–5.2)
Alkaline Phosphatase: 54 U/L (ref 39–117)
BILIRUBIN TOTAL: 0.6 mg/dL (ref 0.2–1.2)
BUN: 14 mg/dL (ref 6–23)
CALCIUM: 9.5 mg/dL (ref 8.4–10.5)
CO2: 26 mEq/L (ref 19–32)
CREATININE: 0.76 mg/dL (ref 0.40–1.20)
Chloride: 105 mEq/L (ref 96–112)
GFR: 83.53 mL/min (ref >60.00)
Glucose, Bld: 105 mg/dL — ABNORMAL HIGH (ref 70–99)
Potassium: 4.2 mEq/L (ref 3.5–5.1)
Sodium: 141 mEq/L (ref 135–145)
Total Protein: 7 g/dL (ref 6.0–8.3)

## 2018-07-09 LAB — TSH: TSH: 3.33 u[IU]/mL (ref 0.35–4.50)

## 2018-07-09 LAB — LDL CHOLESTEROL, DIRECT: Direct LDL: 68 mg/dL

## 2018-07-09 LAB — VITAMIN D 25 HYDROXY (VIT D DEFICIENCY, FRACTURES): VITD: 48.83 ng/mL (ref 30.00–100.00)

## 2018-07-11 ENCOUNTER — Encounter: Payer: Self-pay | Admitting: Family Medicine

## 2018-07-29 ENCOUNTER — Other Ambulatory Visit: Payer: Self-pay | Admitting: Family Medicine

## 2018-07-30 DIAGNOSIS — G4733 Obstructive sleep apnea (adult) (pediatric): Secondary | ICD-10-CM | POA: Diagnosis not present

## 2018-08-02 ENCOUNTER — Ambulatory Visit
Admission: RE | Admit: 2018-08-02 | Discharge: 2018-08-02 | Disposition: A | Discharge status: Home / Self Care | Payer: BLUE CROSS/BLUE SHIELD | Source: Ambulatory Visit | Attending: Family Medicine | Admitting: Family Medicine

## 2018-08-02 DIAGNOSIS — Z1239 Encounter for other screening for malignant neoplasm of breast: Secondary | ICD-10-CM | POA: Diagnosis not present

## 2018-08-02 DIAGNOSIS — Z1231 Encounter for screening mammogram for malignant neoplasm of breast: Secondary | ICD-10-CM | POA: Diagnosis not present

## 2018-09-08 DIAGNOSIS — R002 Palpitations: Secondary | ICD-10-CM

## 2018-09-08 HISTORY — DX: Palpitations: R00.2

## 2018-09-19 ENCOUNTER — Other Ambulatory Visit: Payer: Self-pay

## 2018-09-19 ENCOUNTER — Observation Stay
Admit: 2018-09-19 | Discharge: 2018-09-19 | Disposition: A | Discharge status: Home / Self Care | Payer: BLUE CROSS/BLUE SHIELD | Attending: Cardiology | Admitting: Cardiology

## 2018-09-19 ENCOUNTER — Observation Stay
Admission: EM | Admit: 2018-09-19 | Discharge: 2018-09-20 | Disposition: A | Discharge status: Home / Self Care | Payer: BLUE CROSS/BLUE SHIELD | Attending: Internal Medicine | Admitting: Internal Medicine

## 2018-09-19 DIAGNOSIS — Z8249 Family history of ischemic heart disease and other diseases of the circulatory system: Secondary | ICD-10-CM | POA: Insufficient documentation

## 2018-09-19 DIAGNOSIS — Z825 Family history of asthma and other chronic lower respiratory diseases: Secondary | ICD-10-CM | POA: Insufficient documentation

## 2018-09-19 DIAGNOSIS — R079 Chest pain, unspecified: Secondary | ICD-10-CM | POA: Diagnosis not present

## 2018-09-19 DIAGNOSIS — R072 Precordial pain: Secondary | ICD-10-CM | POA: Diagnosis not present

## 2018-09-19 DIAGNOSIS — Z886 Allergy status to analgesic agent status: Secondary | ICD-10-CM | POA: Insufficient documentation

## 2018-09-19 DIAGNOSIS — K589 Irritable bowel syndrome without diarrhea: Secondary | ICD-10-CM | POA: Insufficient documentation

## 2018-09-19 DIAGNOSIS — E039 Hypothyroidism, unspecified: Secondary | ICD-10-CM | POA: Diagnosis not present

## 2018-09-19 DIAGNOSIS — R002 Palpitations: Secondary | ICD-10-CM | POA: Insufficient documentation

## 2018-09-19 DIAGNOSIS — R9431 Abnormal electrocardiogram [ECG] [EKG]: Secondary | ICD-10-CM | POA: Diagnosis not present

## 2018-09-19 DIAGNOSIS — I119 Hypertensive heart disease without heart failure: Secondary | ICD-10-CM | POA: Diagnosis not present

## 2018-09-19 DIAGNOSIS — E559 Vitamin D deficiency, unspecified: Secondary | ICD-10-CM | POA: Insufficient documentation

## 2018-09-19 DIAGNOSIS — Z79899 Other long term (current) drug therapy: Secondary | ICD-10-CM | POA: Insufficient documentation

## 2018-09-19 DIAGNOSIS — I10 Essential (primary) hypertension: Secondary | ICD-10-CM | POA: Diagnosis not present

## 2018-09-19 DIAGNOSIS — M797 Fibromyalgia: Secondary | ICD-10-CM | POA: Diagnosis not present

## 2018-09-19 DIAGNOSIS — J45909 Unspecified asthma, uncomplicated: Secondary | ICD-10-CM | POA: Insufficient documentation

## 2018-09-19 DIAGNOSIS — Z882 Allergy status to sulfonamides status: Secondary | ICD-10-CM | POA: Insufficient documentation

## 2018-09-19 DIAGNOSIS — Z79890 Hormone replacement therapy (postmenopausal): Secondary | ICD-10-CM | POA: Insufficient documentation

## 2018-09-19 HISTORY — PX: CARDIOVASCULAR STRESS TEST: SHX262

## 2018-09-19 HISTORY — PX: TRANSTHORACIC ECHOCARDIOGRAM: SHX275

## 2018-09-19 LAB — HEMOGLOBIN A1C
Hgb A1c MFr Bld: 5.3 % (ref 4.8–5.6)
Mean Plasma Glucose: 105.41 mg/dL

## 2018-09-19 LAB — COMPREHENSIVE METABOLIC PANEL
ALK PHOS: 65 U/L (ref 38–126)
ALT: 22 U/L (ref 0–44)
AST: 22 U/L (ref 15–41)
Albumin: 4.5 g/dL (ref 3.5–5.0)
Anion gap: 8 (ref 5–15)
BUN: 22 mg/dL — AB (ref 6–20)
CO2: 26 mmol/L (ref 22–32)
CREATININE: 0.9 mg/dL (ref 0.44–1.00)
Calcium: 9.7 mg/dL (ref 8.9–10.3)
Chloride: 106 mmol/L (ref 98–111)
GFR calc Af Amer: >60 mL/min (ref >60)
GFR calc non Af Amer: >60 mL/min (ref >60)
Glucose, Bld: 135 mg/dL — ABNORMAL HIGH (ref 70–99)
Potassium: 3.5 mmol/L (ref 3.5–5.1)
Sodium: 140 mmol/L (ref 135–145)
Total Bilirubin: 0.6 mg/dL (ref 0.3–1.2)
Total Protein: 7.6 g/dL (ref 6.5–8.1)

## 2018-09-19 LAB — CBC WITH DIFFERENTIAL/PLATELET
ABS IMMATURE GRANULOCYTES: 0.02 10*3/uL (ref 0.00–0.07)
Basophils Absolute: 0 10*3/uL (ref 0.0–0.1)
Basophils Relative: 0 %
Eosinophils Absolute: 0.4 10*3/uL (ref 0.0–0.5)
Eosinophils Relative: 5 %
HCT: 45.9 % (ref 36.0–46.0)
Hemoglobin: 14.5 g/dL (ref 12.0–15.0)
Immature Granulocytes: 0 %
LYMPHS ABS: 3.8 10*3/uL (ref 0.7–4.0)
Lymphocytes Relative: 46 %
MCH: 27.2 pg (ref 26.0–34.0)
MCHC: 31.6 g/dL (ref 30.0–36.0)
MCV: 86 fL (ref 80.0–100.0)
Monocytes Absolute: 0.6 10*3/uL (ref 0.1–1.0)
Monocytes Relative: 8 %
Neutro Abs: 3.4 10*3/uL (ref 1.7–7.7)
Neutrophils Relative %: 41 %
Platelets: 344 10*3/uL (ref 150–400)
RBC: 5.34 MIL/uL — ABNORMAL HIGH (ref 3.87–5.11)
RDW: 13.8 % (ref 11.5–15.5)
WBC: 8.3 10*3/uL (ref 4.0–10.5)
nRBC: 0 % (ref 0.0–0.2)

## 2018-09-19 LAB — PROTIME-INR
INR: 0.96
Prothrombin Time: 12.7 seconds (ref 11.4–15.2)

## 2018-09-19 LAB — TROPONIN I
Troponin I: <0.03 ng/mL (ref <0.03)
Troponin I: 0.04 ng/mL (ref <0.03)
Troponin I: 0.04 ng/mL (ref <0.03)
Troponin I: 0.03 ng/mL (ref <0.03)
Troponin I: <0.03 ng/mL (ref <0.03)

## 2018-09-19 LAB — TSH: TSH: 10.793 u[IU]/mL — ABNORMAL HIGH (ref 0.350–4.500)

## 2018-09-19 LAB — MAGNESIUM: Magnesium: 2.1 mg/dL (ref 1.7–2.4)

## 2018-09-19 LAB — APTT: aPTT: 33 seconds (ref 24–36)

## 2018-09-19 MED ORDER — HEPARIN (PORCINE) 25000 UT/250ML-% IV SOLN
1200.0000 [IU]/h | INTRAVENOUS | Status: DC
  Administered 2018-09-19: 1200 [IU]/h via INTRAVENOUS
  Filled 2018-09-19: qty 250

## 2018-09-19 MED ORDER — HEPARIN BOLUS VIA INFUSION
4000.0000 [IU] | Freq: Once | INTRAVENOUS | Status: AC
  Administered 2018-09-19: 4000 [IU] via INTRAVENOUS
  Filled 2018-09-19: qty 4000

## 2018-09-19 MED ORDER — ACETAMINOPHEN 325 MG PO TABS: 650.0000 mg | ORAL_TABLET | Freq: Four times a day (QID) | ORAL | Status: DC | PRN

## 2018-09-19 MED ORDER — DOCUSATE SODIUM 100 MG PO CAPS
100.0000 mg | ORAL_CAPSULE | Freq: Two times a day (BID) | ORAL | Status: DC
  Administered 2018-09-19 – 2018-09-20 (×2): 100 mg via ORAL
  Filled 2018-09-19 (×4): qty 1

## 2018-09-19 MED ORDER — ONDANSETRON HCL 4 MG/2ML IJ SOLN: 4.0000 mg | Freq: Four times a day (QID) | INTRAMUSCULAR | Status: DC | PRN

## 2018-09-19 MED ORDER — PREGABALIN 75 MG PO CAPS: 150.0000 mg | ORAL_CAPSULE | Freq: Two times a day (BID) | ORAL | Status: DC

## 2018-09-19 MED ORDER — DULOXETINE HCL 30 MG PO CPEP
60.0000 mg | ORAL_CAPSULE | Freq: Every day | ORAL | Status: DC
  Administered 2018-09-19 – 2018-09-20 (×2): 60 mg via ORAL
  Filled 2018-09-19 (×2): qty 2

## 2018-09-19 MED ORDER — METOPROLOL SUCCINATE ER 25 MG PO TB24
12.5000 mg | ORAL_TABLET | Freq: Every day | ORAL | Status: DC
  Administered 2018-09-19 – 2018-09-20 (×2): 12.5 mg via ORAL
  Filled 2018-09-19 (×2): qty 1

## 2018-09-19 MED ORDER — PREGABALIN 150 MG PO CAPS
150.0000 mg | ORAL_CAPSULE | Freq: Two times a day (BID) | ORAL | Status: DC
  Administered 2018-09-19 – 2018-09-20 (×2): 150 mg via ORAL
  Filled 2018-09-19 (×2): qty 1

## 2018-09-19 MED ORDER — METOPROLOL SUCCINATE ER 25 MG PO TB24
12.5000 mg | ORAL_TABLET | Freq: Every day | ORAL | Status: DC
  Filled 2018-09-19: qty 1

## 2018-09-19 MED ORDER — ONDANSETRON HCL 4 MG PO TABS: 4.0000 mg | ORAL_TABLET | Freq: Four times a day (QID) | ORAL | Status: DC | PRN

## 2018-09-19 MED ORDER — PERFLUTREN LIPID MICROSPHERE
1.0000 mL | INTRAVENOUS | Status: AC | PRN
  Administered 2018-09-19: 3 mL via INTRAVENOUS
  Filled 2018-09-19: qty 10

## 2018-09-19 MED ORDER — LEVOTHYROXINE SODIUM 50 MCG PO TABS
150.0000 ug | ORAL_TABLET | Freq: Every day | ORAL | Status: DC
  Administered 2018-09-20: 150 ug via ORAL
  Filled 2018-09-19: qty 1

## 2018-09-19 MED ORDER — ACETAMINOPHEN 650 MG RE SUPP: 650.0000 mg | Freq: Four times a day (QID) | RECTAL | Status: DC | PRN

## 2018-09-19 NOTE — Consult Note (Signed)
Cardiology Consultation Note    Patient ID: Brenda Schroeder, MRN: [id number], DOB/AGE: 1961-09-03 57 y.o. Admit date: 09/19/2018   Date of Consult: 09/19/2018 Primary Physician: Jeoffrey Massed, MD Primary Cardiologist:    Chief Complaint: chest pain Reason for Consultation: chest pain Requesting MD: Dr. Elpidio Anis  HPI: Brenda Schroeder is a 58 y.o. female with history of triglyceridemia, fibromyalgia, hypertension who presented to the emergency room with complaints of a sensation that her heart was racing.  Her pulse oximetry at her home was used and reported her heart rate was in the 160s to 180s.  She had some chest heaviness with this with some radiation to her arm and neck.  She presented to the emergency room where electrocardiogram revealed sinus rhythm with nonspecific ST-T wave changes.  She had PACs.  She denies any prior cardiac history.  She does not exercise but remains fairly active without symptoms.  She denies syncope, presyncope.  She denies any exertional symptoms.  She is pain-free at present.  She has ruled out for myocardial infarction with serum troponin of 0.042.  She has had a history of palpitations in the past.  Past Medical History:  Diagnosis Date  . Allergy   . Asthma   . Fibromyalgia 2017  . Herniated disc L4-L5  . High triglycerides    mild.  TLC.  Marland Kitchen Hypertension   . Hypothyroidism   . IBS (irritable bowel syndrome)   . Internal hemorrhoids   . Neuropathic pain    Rx'd neurontin by her orthopedist  . Sprain of posterior cruciate ligament of knee right knee  . Vitamin D deficiency 02/2017   high dose vit D started 02/23/17--vit D still low on recheck.  Needs 50K twice per week dosing indefinitely. Vit D level mid 40s Nov 2019.      Surgical History:  Past Surgical History:  Procedure Laterality Date  . COLONOSCOPY W/ POLYPECTOMY  09/2012   Dr. Loreta Ave: diverticulosis.  Recall 2024.  Marland Kitchen TOTAL ABDOMINAL HYSTERECTOMY W/ BILATERAL SALPINGOOPHORECTOMY  approx  2006   Nonmalignant reasons     Home Meds: Prior to Admission medications   Medication Sig Start Date End Date Taking? Authorizing Provider  calcium carbonate (TUMS - DOSED IN MG ELEMENTAL CALCIUM) 500 MG chewable tablet Chew 1 tablet by mouth as needed for indigestion or heartburn.   Yes [provider]  DULoxetine (CYMBALTA) 60 MG capsule TAKE 1 CAPSULE BY MOUTH EVERY DAY 07/29/18  Yes McGowen, Maryjean Morn, MD  levothyroxine (SYNTHROID, LEVOTHROID) 150 MCG tablet TAKE 1 TABLET BY MOUTH EVERY DAY 07/29/18  Yes McGowen, Maryjean Morn, MD  LYRICA 150 MG capsule TAKE 1 CAPSULE BY MOUTH TWICE A DAY 03/23/18  Yes McGowen, Maryjean Morn, MD  Vitamin D, Ergocalciferol, (DRISDOL) 50000 units CAPS capsule Take one capsule twice a week Patient taking differently: Take 50,000 Units by mouth 2 (two) times a week. On Sunday and Wednesday 01/05/18  Yes McGowen, Maryjean Morn, MD    Inpatient Medications:  . docusate sodium  100 mg Oral BID  . metoprolol succinate  12.5 mg Oral Daily   . heparin 1,200 Units/hr (09/19/18 0731)    Allergies:  Allergies  Allergen Reactions  . Aspirin Shortness Of Breath and Other (See Comments)    nasal congestion  . Nsaids Anaphylaxis  . Sulfa Antibiotics Rash    Social History   Socioeconomic History  . Marital status: Married    Spouse name: Not on file  . Number of children: Not on  file  . Years of education: Not on file  . Highest education level: Not on file  Occupational History  . Not on file  Social Needs  . Financial resource strain: Not on file  . Food insecurity:    Worry: Not on file    Inability: Not on file  . Transportation needs:    Medical: Not on file    Non-medical: Not on file  Tobacco Use  . Smoking status: Never Smoker  . Smokeless tobacco: Never Used  Substance and Sexual Activity  . Alcohol use: No    Comment: rarely  . Drug use: No  . Sexual activity: Yes    Partners: Male    Birth control/protection: None  Lifestyle  .  Physical activity:    Days per week: Not on file    Minutes per session: Not on file  . Stress: Not on file  Relationships  . Social connections:    Talks on phone: Not on file    Gets together: Not on file    Attends religious service: Not on file    Active member of club or organization: Not on file    Attends meetings of clubs or organizations: Not on file    Relationship status: Not on file  . Intimate partner violence:    Fear of current or ex partner: Not on file    Emotionally abused: Not on file    Physically abused: Not on file    Forced sexual activity: Not on file  Other Topics Concern  . Not on file  Social History Narrative   Married 26 yrs, 3 children (1 daught, 2 sons).   [place]   Occupation: works for [employer] as of 06/2017.   No tobacco, very rare alcohol, no drugs.           Family History  Problem Relation Age of Onset  . Arthritis Mother        osteo arithritis  . Obesity Mother   . Fibroids Mother        uterine  . Lymphoma Father   . Sarcoidosis Father   . Pulmonary fibrosis Father   . Arthritis Maternal Grandmother        rheumatoid  . Cancer Maternal Grandmother        cervical  . Heart disease Maternal Grandfather        a-fib  . COPD Maternal Grandfather        emphysema  . Vision loss Paternal Grandmother   . Emphysema Paternal Grandfather   . Lung cancer Paternal Grandfather   . Celiac disease Sister   . Asthma Son   . Cancer Daughter        ovarian  . Obesity Sister      Review of Systems: A 12-system review of systems was performed and is negative except as noted in the HPI.  Labs: Recent Labs    09/19/18 0117 09/19/18 0415 09/19/18 0708  TROPONINI <0.03 0.04* 0.04*   Lab Results  Component Value Date   WBC 8.3 09/19/2018   HGB 14.5 09/19/2018   HCT 45.9 09/19/2018   MCV 86.0 09/19/2018   PLT 344 09/19/2018    Recent Labs  Lab 09/19/18 0117  NA 140  K 3.5   CL 106  CO2 26  BUN 22*  CREATININE 0.90  CALCIUM 9.7  PROT 7.6  BILITOT 0.6  ALKPHOS 65  ALT 22  AST 22  GLUCOSE  135*   Lab Results  Component Value Date   CHOL 250 (H) 07/09/2018   HDL 40.70 07/09/2018   LDLCALC 76 10/05/2013   TRIG 350.0 (H) 07/09/2018   No results found for: DDIMER  Radiology/Studies:  No results found.  Wt Readings from Last 3 Encounters:  09/19/18 (!) 137.5 kg  07/02/18 (!) 137 kg  01/01/18 135.3 kg    EKG: Sinus arrhythmia with PACs.  Physical Exam:  Blood pressure (!) 125/50, pulse 68, temperature 97.8 F (36.6 C), temperature source Oral, resp. rate 18, height 5' 2.5" (1.588 m), weight (!) 137.5 kg, SpO2 98 %. Body mass index is 54.57 kg/m. General: Well developed, well nourished, in no acute distress. Head: Normocephalic, atraumatic, sclera non-icteric, no xanthomas, nares are without discharge.  Neck: Negative for carotid bruits. JVD not elevated. Lungs: Clear bilaterally to auscultation without wheezes, rales, or rhonchi. Breathing is unlabored. Heart: RRR with S1 S2. No murmurs, rubs, or gallops appreciated. Abdomen: Soft, non-tender, non-distended with normoactive bowel sounds. No hepatomegaly. No rebound/guarding. No obvious abdominal masses. Msk:  Strength and tone appear normal for age. Extremities: No clubbing or cyanosis. No edema.  Distal pedal pulses are 2+ and equal bilaterally. Neuro: Alert and oriented X 3. No facial asymmetry. No focal deficit. Moves all extremities spontaneously. Psych:  Responds to questions appropriately with a normal affect.     Assessment and Plan  58 year old female with no prior cardiac history who presented with 30 to 60 minutes of tachycardia at home with heart rates per her report of 160-180.  She had some chest heaviness during this event.  She presented to the emergency room where EKG showed improved heart rate.  She was relatively hypertensive on presentation.  She appears to have ruled out for  myocardial infarction with a troponin of 0.042.  She is symptom-free at present.  We will proceed with an echocardiogram today to evaluate her LV function, valves for evidence of structural disease.  If there is no significant wall motion abnormalities or other abnormalities, will proceed with a Lexiscan sestamibi in the morning to assess for ischemia.  Will discontinue heparin.  We will hold beta-blocker for now given her relative hypotension.  We will add this back if she has significant tachyarrhythmias.  Further recommendations after this is complete. Signed, Dalia Heading MD 09/19/2018, 8:54 AM Pager: [phone]

## 2018-09-19 NOTE — ED Triage Notes (Signed)
Reports symptoms for approximately 45 minutes.  States rapid heart rate.

## 2018-09-19 NOTE — Progress Notes (Signed)
*  PRELIMINARY RESULTS* Echocardiogram 2D Echocardiogram has been performed. Definity IV contrast used on this study.  Garrel Ridgel Ruble Buttler 09/19/2018, 1:39 PM

## 2018-09-19 NOTE — H&P (Addendum)
Brenda Schroeder is an 58 y.o. female.   Chief Complaint: Palpitations HPI: The patient with past medical history of asthma, hypertension and hypothyroidism presents to the emergency department due to palpitations.  The patient states that she felt her heart racing as she lay down to go to sleep.  Her heartbeat was so fast that occasionally she admits to vague substernal chest pain.  She is chest pain-free at this time however laboratory evaluation showed slight bump in troponin.  Due to concern for possible tacky arrhythmias as well as symptoms consistent with anginal equivalents the emergency department staff started the patient on a heparin drip prior to calling the hospitalist service for further evaluation.  Past Medical History:  Diagnosis Date  . Allergy   . Asthma   . Fibromyalgia 2017  . Herniated disc L4-L5  . High triglycerides    mild.  TLC.  Marland Kitchen Hypertension   . Hypothyroidism   . IBS (irritable bowel syndrome)   . Internal hemorrhoids   . Neuropathic pain    Rx'd neurontin by her orthopedist  . Sprain of posterior cruciate ligament of knee right knee  . Vitamin D deficiency 02/2017   high dose vit D started 02/23/17--vit D still low on recheck.  Needs 50K twice per week dosing indefinitely. Vit D level mid 40s Nov 2019.    Past Surgical History:  Procedure Laterality Date  . COLONOSCOPY W/ POLYPECTOMY  09/2012   Dr. Loreta Ave: diverticulosis.  Recall 2024.  Marland Kitchen TOTAL ABDOMINAL HYSTERECTOMY W/ BILATERAL SALPINGOOPHORECTOMY  approx 2006   Nonmalignant reasons    Family History  Problem Relation Age of Onset  . Arthritis Mother        osteo arithritis  . Obesity Mother   . Fibroids Mother        uterine  . Lymphoma Father   . Sarcoidosis Father   . Pulmonary fibrosis Father   . Arthritis Maternal Grandmother        rheumatoid  . Cancer Maternal Grandmother        cervical  . Heart disease Maternal Grandfather        a-fib  . COPD Maternal Grandfather        emphysema   . Vision loss Paternal Grandmother   . Emphysema Paternal Grandfather   . Lung cancer Paternal Grandfather   . Celiac disease Sister   . Asthma Son   . Cancer Daughter        ovarian  . Obesity Sister    Social History:  reports that she has never smoked. She has never used smokeless tobacco. She reports that she does not drink alcohol or use drugs.  Allergies:  Allergies  Allergen Reactions  . Aspirin Shortness Of Breath and Other (See Comments)    nasal congestion  . Nsaids Anaphylaxis  . Sulfa Antibiotics Rash    Medications Prior to Admission  Medication Sig Dispense Refill  . calcium carbonate (TUMS - DOSED IN MG ELEMENTAL CALCIUM) 500 MG chewable tablet Chew 1 tablet by mouth as needed for indigestion or heartburn.    . DULoxetine (CYMBALTA) 60 MG capsule TAKE 1 CAPSULE BY MOUTH EVERY DAY 90 capsule 1  . levothyroxine (SYNTHROID, LEVOTHROID) 150 MCG tablet TAKE 1 TABLET BY MOUTH EVERY DAY 90 tablet 1  . LYRICA 150 MG capsule TAKE 1 CAPSULE BY MOUTH TWICE A DAY 180 capsule 1  . Vitamin D, Ergocalciferol, (DRISDOL) 50000 units CAPS capsule Take one capsule twice a week (Patient taking differently: Take 50,000 Units  by mouth 2 (two) times a week. On Sunday and Wednesday) 24 capsule 3    Results for orders placed or performed during the hospital encounter of 09/19/18 (from the past 48 hour(s))  CBC with Differential/Platelet     Status: Abnormal   Collection Time: 09/19/18  1:17 AM  Result Value Ref Range   WBC 8.3 4.0 - 10.5 K/uL   RBC 5.34 (H) 3.87 - 5.11 MIL/uL   Hemoglobin 14.5 12.0 - 15.0 g/dL   HCT 16.145.9 09.636.0 - 04.546.0 %   MCV 86.0 80.0 - 100.0 fL   MCH 27.2 26.0 - 34.0 pg   MCHC 31.6 30.0 - 36.0 g/dL   RDW 40.913.8 81.111.5 - 91.415.5 %   Platelets 344 150 - 400 K/uL   nRBC 0.0 0.0 - 0.2 %   Neutrophils Relative % 41 %   Neutro Abs 3.4 1.7 - 7.7 K/uL   Lymphocytes Relative 46 %   Lymphs Abs 3.8 0.7 - 4.0 K/uL   Monocytes Relative 8 %   Monocytes Absolute 0.6 0.1 - 1.0 K/uL    Eosinophils Relative 5 %   Eosinophils Absolute 0.4 0.0 - 0.5 K/uL   Basophils Relative 0 %   Basophils Absolute 0.0 0.0 - 0.1 K/uL   Immature Granulocytes 0 %   Abs Immature Granulocytes 0.02 0.00 - 0.07 K/uL    Comment: Performed at [facility], [address] [place], [place] [address]  Comprehensive metabolic panel     Status: Abnormal   Collection Time: 09/19/18  1:17 AM  Result Value Ref Range   Sodium 140 135 - 145 mmol/L   Potassium 3.5 3.5 - 5.1 mmol/L   Chloride 106 98 - 111 mmol/L   CO2 26 22 - 32 mmol/L   Glucose, Bld 135 (H) 70 - 99 mg/dL   BUN 22 (H) 6 - 20 mg/dL   Creatinine, Ser 6.210.90 0.44 - 1.00 mg/dL   Calcium 9.7 8.9 - 30.810.3 mg/dL   Total Protein 7.6 6.5 - 8.1 g/dL   Albumin 4.5 3.5 - 5.0 g/dL   AST 22 15 - 41 U/L   ALT 22 0 - 44 U/L   Alkaline Phosphatase 65 38 - 126 U/L   Total Bilirubin 0.6 0.3 - 1.2 mg/dL   GFR calc non Af Amer >60 >60 mL/min   GFR calc Af Amer >60 >60 mL/min   Anion gap 8 5 - 15    Comment: Performed at [facility], [address] [place], [place] [address]  Magnesium     Status: None   Collection Time: 09/19/18  1:17 AM  Result Value Ref Range   Magnesium 2.1 1.7 - 2.4 mg/dL    Comment: Performed at [facility], [address] [place], [place] [address]  TSH     Status: Abnormal   Collection Time: 09/19/18  1:17 AM  Result Value Ref Range   TSH 10.793 (H) 0.350 - 4.500 uIU/mL    Comment: Performed by a 3rd Generation assay with a functional sensitivity of <=0.01 uIU/mL. Performed at [facility], [address] [place], [place] [address]   Troponin I - ONCE - STAT     Status: None   Collection Time: 09/19/18  1:17 AM  Result Value Ref Range   Troponin I <0.03 <0.03 ng/mL    Comment: Performed at [facility], [address] [place], [place] [address]  Troponin I - Once-Timed     Status: Abnormal   Collection  Time: 09/19/18  4:15 AM  Result Value Ref Range   Troponin I  0.04 (HH) <0.03 ng/mL    Comment: CRITICAL RESULT CALLED TO, READ BACK BY AND VERIFIED WITH LEE FURGESON ON 09/19/18 AT 0454 BY JAG Performed at [facility], [address] [place], [place] [address]   Protime-INR     Status: None   Collection Time: 09/19/18  5:35 AM  Result Value Ref Range   Prothrombin Time 12.7 11.4 - 15.2 seconds   INR 0.96     Comment: Performed at [facility], [address] [place], [place] [address]  APTT     Status: None   Collection Time: 09/19/18  5:35 AM  Result Value Ref Range   aPTT 33 24 - 36 seconds    Comment: Performed at [facility], [address] [place], [place] [address]   No results found.  Review of Systems  Constitutional: Negative for chills and fever.  HENT: Negative for sore throat and tinnitus.   Eyes: Negative for blurred vision and redness.  Respiratory: Negative for cough and shortness of breath.   Cardiovascular: Positive for chest pain and palpitations. Negative for orthopnea and PND.  Gastrointestinal: Negative for abdominal pain, diarrhea, nausea and vomiting.  Genitourinary: Negative for dysuria, frequency and urgency.  Musculoskeletal: Negative for joint pain and myalgias.  Skin: Negative for rash.       No lesions  Neurological: Negative for speech change, focal weakness and weakness.  Endo/Heme/Allergies: Does not bruise/bleed easily.       No temperature intolerance  Psychiatric/Behavioral: Negative for depression and suicidal ideas.    Blood pressure (!) 144/92, pulse 63, temperature 97.6 F (36.4 C), temperature source Oral, resp. rate 18, height 5' 2.5" (1.588 m), weight (!) 137.5 kg, SpO2 99 %. Physical Exam  Vitals reviewed. Constitutional: She is oriented to person, place, and time. She appears well-developed and well-nourished. No distress.  HENT:  Head: Normocephalic and atraumatic.  Mouth/Throat: Oropharynx is clear and moist.  Eyes: Pupils are equal, round, and reactive  to light. Conjunctivae and EOM are normal. No scleral icterus.  Neck: Normal range of motion. Neck supple. No JVD present. No tracheal deviation present. No thyromegaly present.  Cardiovascular: Normal rate, regular rhythm and normal heart sounds. Exam reveals no gallop and no friction rub.  No murmur heard. Respiratory: Effort normal and breath sounds normal.  GI: Soft. Bowel sounds are normal. She exhibits no distension. There is no abdominal tenderness.  Genitourinary:    Genitourinary Comments: Deferred   Musculoskeletal: Normal range of motion.        General: No edema.  Lymphadenopathy:    She has no cervical adenopathy.  Neurological: She is alert and oriented to person, place, and time. No cranial nerve deficit. She exhibits normal muscle tone.  Skin: Skin is warm and dry. No rash noted. No erythema.  Psychiatric: She has a normal mood and affect. Her behavior is normal. Judgment and thought content normal.     Assessment/Plan This is a 58 year old female admitted for chest pain. 1.  Chest pain: Usually associated with palpitations.  May indicate demand ischemia.  Patient has some risk factors for cardiac disease.  Follow cardiac biomarkers.  Monitor telemetry.  Consult cardiology. 2.  Palpitations: Intermittent.  Rule out tachyarrhythmias.  Electrolytes within normal range.  Continue to monitor telemetry 3.  Hypertension: Controlled.  Continue to monitor 4.  Hypothyroidism: Check TSH; continue Synthroid 5.  DVT prophylaxis: As above 6.  GI prophylaxis: None The patient is a full code.  Time spent on admission orders and patient care approximately 45 minutes  Arnaldo Natal, MD 09/19/2018, 6:59 AM

## 2018-09-19 NOTE — ED Notes (Signed)
Lab called to report elevated troponin. Primary nurse notified as well as EDP. No new orders received.

## 2018-09-19 NOTE — Progress Notes (Signed)
ANTICOAGULATION CONSULT NOTE - Initial Consult  Pharmacy Consult for heparin drip Indication: chest pain/ACS  Allergies  Allergen Reactions  . Nsaids Anaphylaxis  . Sulfa Antibiotics Rash  . Aspirin     Short of breath, nasal congestion    Patient Measurements: Height: 5' 3.5" (161.3 cm) Weight: 300 lb (136.1 kg) IBW/kg (Calculated) : 53.55 Heparin Dosing Weight: 89 kg  Vital Signs: BP: 128/82 (01/12 0500) Pulse Rate: 64 (01/12 0500)  Labs: Recent Labs    09/19/18 0117 09/19/18 0415  HGB 14.5  --   HCT 45.9  --   PLT 344  --   CREATININE 0.90  --   TROPONINI <0.03 0.04*    Estimated Creatinine Clearance: 95.4 mL/min (by C-G formula based on SCr of 0.9 mg/dL).   Medical History: Past Medical History:  Diagnosis Date  . Allergy   . Asthma   . Fibromyalgia 2017  . Herniated disc L4-L5  . High triglycerides    mild.  TLC.  Marland Kitchen Hypertension   . Hypothyroidism   . IBS (irritable bowel syndrome)   . Internal hemorrhoids   . Neuropathic pain    Rx'd neurontin by her orthopedist  . Sprain of posterior cruciate ligament of knee right knee  . Vitamin D deficiency 02/2017   high dose vit D started 02/23/17--vit D still low on recheck.  Needs 50K twice per week dosing indefinitely. Vit D level mid 40s Nov 2019.    Medications:  No anticoag in PTA meds  Assessment: Trop 0.04  Goal of Therapy:  Heparin level 0.3-0.7 units/ml Monitor platelets by anticoagulation protocol: Yes   Plan:  4000 unit bolus and initial rate of 1200 units/hr. First heparin level 6 hours after start of infusion.  Jamira Barfuss S 09/19/2018,5:33 AM

## 2018-09-19 NOTE — ED Provider Notes (Signed)
[facility] Emergency Department Provider Note    First MD Initiated Contact with Patient 09/19/18 0121     (approximate)  I have reviewed the triage vital signs and the nursing notes.   HISTORY  Chief Complaint Tachycardia    HPI Isla PenceSherri Hovland is a 58 y.o. female below listed past medical history presents the ER for palpitations midsternal chest pressure radiating to her left shoulder and jaw associated with nausea and diaphoresis where she felt that her heart was racing.  Has had 2 similar episodes in the past 72 hours.  Each 1 occurring typically at night.  Initial one lasted only a minute.  Last night as well as 5 to 10 minutes, tonight's episode lasting roughly 30 to 45minutes.  States that she checked her pulse on the home monitor that she has for her husband who is 3 180.  By the time she arrived in the ER an EKG was performed showing sinus rhythm with normal heart rate and her symptoms had resolved.  She denies any discomfort at this time.    Past Medical History:  Diagnosis Date  . Allergy   . Asthma   . Fibromyalgia 2017  . Herniated disc L4-L5  . High triglycerides    mild.  TLC.  Marland Kitchen. Hypertension   . Hypothyroidism   . IBS (irritable bowel syndrome)   . Internal hemorrhoids   . Neuropathic pain    Rx'd neurontin by her orthopedist  . Sprain of posterior cruciate ligament of knee right knee  . Vitamin D deficiency 02/2017   high dose vit D started 02/23/17--vit D still low on recheck.  Needs 50K twice per week dosing indefinitely. Vit D level mid 40s Nov 2019.   Family History  Problem Relation Age of Onset  . Arthritis Mother        osteo arithritis  . Obesity Mother   . Fibroids Mother        uterine  . Lymphoma Father   . Sarcoidosis Father   . Pulmonary fibrosis Father   . Arthritis Maternal Grandmother        rheumatoid  . Cancer Maternal Grandmother        cervical  . Heart disease Maternal Grandfather        a-fib  . COPD  Maternal Grandfather        emphysema  . Vision loss Paternal Grandmother   . Emphysema Paternal Grandfather   . Lung cancer Paternal Grandfather   . Celiac disease Sister   . Asthma Son   . Cancer Daughter        ovarian  . Obesity Sister    Past Surgical History:  Procedure Laterality Date  . COLONOSCOPY W/ POLYPECTOMY  09/2012   Dr. Loreta AveMann: diverticulosis.  Recall 2024.  Marland Kitchen. TOTAL ABDOMINAL HYSTERECTOMY W/ BILATERAL SALPINGOOPHORECTOMY  approx 2006   Nonmalignant reasons   Patient Active Problem List   Diagnosis Date Noted  . Obstructive sleep apnea 08/10/2014  . Anxiety state 01/10/2014  . Health maintenance examination 10/05/2013  . Depression 10/05/2013  . Fatigue 10/05/2013  . Vitamin D deficiency 10/05/2013  . Kidney stones 05/20/2013  . Severe obesity (BMI >= 40) (HCC) 02/18/2013  . Lumbar degenerative disc disease 12/13/2012  . Hyperlipidemia 08/16/2012  . Hematuria 08/16/2012  . Asthma 11/17/2011  . Hypothyroid 11/17/2011      Prior to Admission medications   Medication Sig Start Date End Date Taking? Authorizing Provider  calcium carbonate (TUMS - DOSED IN  MG ELEMENTAL CALCIUM) 500 MG chewable tablet Chew 1 tablet by mouth as needed for indigestion or heartburn.    [provider]  DULoxetine (CYMBALTA) 60 MG capsule TAKE 1 CAPSULE BY MOUTH EVERY DAY 07/29/18   McGowen, Maryjean Morn, MD  levothyroxine (SYNTHROID, LEVOTHROID) 150 MCG tablet TAKE 1 TABLET BY MOUTH EVERY DAY 07/29/18   McGowen, Maryjean Morn, MD  LYRICA 150 MG capsule TAKE 1 CAPSULE BY MOUTH TWICE A DAY 03/23/18   McGowen, Maryjean Morn, MD  Vitamin D, Ergocalciferol, (DRISDOL) 50000 units CAPS capsule Take one capsule twice a week 01/05/18   McGowen, Maryjean Morn, MD    Allergies Nsaids; Sulfa antibiotics; and Aspirin    Social History Social History   Tobacco Use  . Smoking status: Never Smoker  . Smokeless tobacco: Never Used  Substance Use Topics  . Alcohol use: No    Comment: rarely  . Drug  use: No    Review of Systems Patient denies headaches, rhinorrhea, blurry vision, numbness, shortness of breath, chest pain, edema, cough, abdominal pain, nausea, vomiting, diarrhea, dysuria, fevers, rashes or hallucinations unless otherwise stated above in HPI. ____________________________________________   PHYSICAL EXAM:  VITAL SIGNS: Vitals:   09/19/18 0345 09/19/18 0400  BP:  117/87  Pulse: 67 67  Resp: 11 13  SpO2: 95% 93%    Constitutional: Alert and oriented.  Eyes: Conjunctivae are normal.  Head: Atraumatic. Nose: No congestion/rhinnorhea. Mouth/Throat: Mucous membranes are moist.   Neck: No stridor. Painless ROM.  Cardiovascular: Normal rate, regular rhythm. Grossly normal heart sounds.  Good peripheral circulation. Respiratory: Normal respiratory effort.  No retractions. Lungs CTAB. Gastrointestinal: Soft and nontender. No distention. No abdominal bruits. No CVA tenderness. Genitourinary:  Musculoskeletal: No lower extremity tenderness nor edema.  No joint effusions. Neurologic:  Normal speech and language. No gross focal neurologic deficits are appreciated. No facial droop Skin:  Skin is warm, dry and intact. No rash noted. Psychiatric: Mood and affect are normal. Speech and behavior are normal.  ____________________________________________   LABS (all labs ordered are listed, but only abnormal results are displayed)  Results for orders placed or performed during the hospital encounter of 09/19/18 (from the past 24 hour(s))  CBC with Differential/Platelet     Status: Abnormal   Collection Time: 09/19/18  1:17 AM  Result Value Ref Range   WBC 8.3 4.0 - 10.5 K/uL   RBC 5.34 (H) 3.87 - 5.11 MIL/uL   Hemoglobin 14.5 12.0 - 15.0 g/dL   HCT 78.4 69.6 - 29.5 %   MCV 86.0 80.0 - 100.0 fL   MCH 27.2 26.0 - 34.0 pg   MCHC 31.6 30.0 - 36.0 g/dL   RDW 28.4 13.2 - 44.0 %   Platelets 344 150 - 400 K/uL   nRBC 0.0 0.0 - 0.2 %   Neutrophils Relative % 41 %   Neutro  Abs 3.4 1.7 - 7.7 K/uL   Lymphocytes Relative 46 %   Lymphs Abs 3.8 0.7 - 4.0 K/uL   Monocytes Relative 8 %   Monocytes Absolute 0.6 0.1 - 1.0 K/uL   Eosinophils Relative 5 %   Eosinophils Absolute 0.4 0.0 - 0.5 K/uL   Basophils Relative 0 %   Basophils Absolute 0.0 0.0 - 0.1 K/uL   Immature Granulocytes 0 %   Abs Immature Granulocytes 0.02 0.00 - 0.07 K/uL  Comprehensive metabolic panel     Status: Abnormal   Collection Time: 09/19/18  1:17 AM  Result Value Ref Range   Sodium 140  135 - 145 mmol/L   Potassium 3.5 3.5 - 5.1 mmol/L   Chloride 106 98 - 111 mmol/L   CO2 26 22 - 32 mmol/L   Glucose, Bld 135 (H) 70 - 99 mg/dL   BUN 22 (H) 6 - 20 mg/dL   Creatinine, Ser 1.61 0.44 - 1.00 mg/dL   Calcium 9.7 8.9 - 09.6 mg/dL   Total Protein 7.6 6.5 - 8.1 g/dL   Albumin 4.5 3.5 - 5.0 g/dL   AST 22 15 - 41 U/L   ALT 22 0 - 44 U/L   Alkaline Phosphatase 65 38 - 126 U/L   Total Bilirubin 0.6 0.3 - 1.2 mg/dL   GFR calc non Af Amer >60 >60 mL/min   GFR calc Af Amer >60 >60 mL/min   Anion gap 8 5 - 15  Magnesium     Status: None   Collection Time: 09/19/18  1:17 AM  Result Value Ref Range   Magnesium 2.1 1.7 - 2.4 mg/dL  TSH     Status: Abnormal   Collection Time: 09/19/18  1:17 AM  Result Value Ref Range   TSH 10.793 (H) 0.350 - 4.500 uIU/mL  Troponin I - ONCE - STAT     Status: None   Collection Time: 09/19/18  1:17 AM  Result Value Ref Range   Troponin I <0.03 <0.03 ng/mL  Troponin I - Once-Timed     Status: Abnormal   Collection Time: 09/19/18  4:15 AM  Result Value Ref Range   Troponin I 0.04 (HH) <0.03 ng/mL   ____________________________________________  EKG My review and personal interpretation at Time: 1:09   Indication: palpitations  Rate: 90  Rhythm: sinus Axis: normal Other: no stemi, occasional pac ____________________________________________  RADIOLOGY  I personally reviewed all radiographic images ordered to evaluate for the above acute complaints and reviewed  radiology reports and findings.  These findings were personally discussed with the patient.  Please see medical record for radiology report.  ____________________________________________   PROCEDURES  Procedure(s) performed:  .Critical Care Performed by: Willy Eddy, MD Authorized by: Willy Eddy, MD   Critical care provider statement:    Critical care time (minutes):  5   Critical care time was exclusive of:  Separately billable procedures and treating other patients   Critical care was necessary to treat or prevent imminent or life-threatening deterioration of the following conditions:  Cardiac failure   Critical care was time spent personally by me on the following activities:  Development of treatment plan with patient or surrogate, discussions with consultants, evaluation of patient's response to treatment, examination of patient, obtaining history from patient or surrogate, ordering and performing treatments and interventions, ordering and review of laboratory studies, ordering and review of radiographic studies, pulse oximetry, re-evaluation of patient's condition and review of old charts      Critical Care performed: yes ____________________________________________   INITIAL IMPRESSION / ASSESSMENT AND PLAN / ED COURSE  Pertinent labs & imaging results that were available during my care of the patient were reviewed by me and considered in my medical decision making (see chart for details).   DDX: dysrythmia, electrolyte abn, anemia, afib, svt, vtach, acs  Ladoris Lythgoe is a 58 y.o. who presents to the ED with discomfort as described above.  Patient is describing some typical NAD typical chest pain features primarily related to palpitations and significantly elevated heart rate to the 180s.  Fortunately her heart rate has stabilized upon arrival to the ER but seems to be having  more increased frequency and severity of chest pain associated with the symptoms.   Initial EKG shows sinus rhythm with nonspecific ST changes.  Initial enzyme negative.  Will place on telemetry monitoring repeat troponin further hemodynamic monitoring due to concern for ACS.  Clinical Course as of Sep 19 510  Sun Sep 19, 2018  0110 EKG 12-Lead [PR]  [phone] Repeat troponin is mildly elevated as compared to previous.  Given the patient's description of pain with palpitations with discomfort radiating up into her jaw associated with diaphoresis and no previous cardiac work-up I do believe the patient would benefit from hospitalization for further telemetry monitoring and medical management.  She states that she is unable to tolerate aspirin therefore will heparinize as I cannot exclude unstable angina.  Have discussed with the patient and available family all diagnostics and treatments performed thus far and all questions were answered to the best of my ability. The patient demonstrates understanding and agreement with plan.    [PR]    Clinical Course User Index [PR] Willy Eddyobinson, Abass Misener, MD     As part of my medical decision making, I reviewed the following data within the electronic MEDICAL RECORD NUMBER Nursing notes reviewed and incorporated, Labs reviewed, notes from prior ED visits.   ____________________________________________   FINAL CLINICAL IMPRESSION(S) / ED DIAGNOSES  Final diagnoses:  Chest pain, unspecified type  Palpitations      NEW MEDICATIONS STARTED DURING THIS VISIT:  New Prescriptions   No medications on file     Note:  This document was prepared using Dragon voice recognition software and may include unintentional dictation errors.    Willy Eddyobinson, Merl Bommarito, MD 09/19/18 [id number]

## 2018-09-19 NOTE — Progress Notes (Signed)
   Advance care planning  Purpose of Encounter Palpitations and chest pain  Parties in Attendance Patient  Patients Decisional capacity Alert and oriented. Able to make medical desicions  No advance care planning in place  Discussed with patient regarding her need for admission with chest pain, palpitations, treatment plan and prognosis.  CODE STATUS full code.  Time spent- 18 minutes

## 2018-09-20 ENCOUNTER — Observation Stay: Payer: BLUE CROSS/BLUE SHIELD

## 2018-09-20 ENCOUNTER — Encounter: Payer: Self-pay | Admitting: *Deleted

## 2018-09-20 DIAGNOSIS — R002 Palpitations: Secondary | ICD-10-CM | POA: Diagnosis not present

## 2018-09-20 DIAGNOSIS — R079 Chest pain, unspecified: Secondary | ICD-10-CM | POA: Diagnosis not present

## 2018-09-20 DIAGNOSIS — E039 Hypothyroidism, unspecified: Secondary | ICD-10-CM | POA: Diagnosis not present

## 2018-09-20 DIAGNOSIS — I10 Essential (primary) hypertension: Secondary | ICD-10-CM | POA: Diagnosis not present

## 2018-09-20 LAB — NM MYOCAR MULTI W/SPECT W/WALL MOTION / EF
CHL CUP MPHR: 164 {beats}/min
CHL CUP NUCLEAR SDS: 2
Estimated workload: 1 METS
Exercise duration (min): 1 min
Exercise duration (sec): 0 s
LV dias vol: 85 mL (ref 46–106)
LV sys vol: 31 mL
Peak HR: 88 {beats}/min
Percent HR: 53 %
Rest HR: 61 {beats}/min
SRS: 9
SSS: 5
TID: 0.9

## 2018-09-20 LAB — ECHOCARDIOGRAM COMPLETE
Height: 62.5 in
WEIGHTICAEL: 4851.2 [oz_av]

## 2018-09-20 MED ORDER — LEVOTHYROXINE SODIUM 175 MCG PO TABS: 175.0000 ug | ORAL_TABLET | Freq: Every day | ORAL | 0 refills | Status: DC

## 2018-09-20 MED ORDER — TECHNETIUM TC 99M TETROFOSMIN IV KIT
10.5900 | PACK | Freq: Once | INTRAVENOUS | Status: AC | PRN
  Administered 2018-09-20: 10.59 via INTRAVENOUS

## 2018-09-20 MED ORDER — METOPROLOL SUCCINATE ER 25 MG PO TB24: 12.5000 mg | ORAL_TABLET | Freq: Every day | ORAL | 0 refills | Status: DC

## 2018-09-20 MED ORDER — TECHNETIUM TC 99M TETROFOSMIN IV KIT
30.0000 | PACK | Freq: Once | INTRAVENOUS | Status: AC | PRN
  Administered 2018-09-20: 30.941 via INTRAVENOUS

## 2018-09-20 MED ORDER — REGADENOSON 0.4 MG/5ML IV SOLN
0.4000 mg | Freq: Once | INTRAVENOUS | Status: AC
  Administered 2018-09-20: 0.4 mg via INTRAVENOUS

## 2018-09-20 NOTE — Plan of Care (Signed)
  Problem: Activity: Goal: Risk for activity intolerance will decrease Outcome: Progressing Note:  Independent in room, husband at bedside   Problem: Elimination: Goal: Will not experience complications related to urinary retention Outcome: Progressing   Problem: Pain Managment: Goal: General experience of comfort will improve Outcome: Progressing Note:  No complaints of pain this shift   Problem: Education: Goal: Knowledge of General Education information will improve Description Including pain rating scale, medication(s)/side effects and non-pharmacologic comfort measures Outcome: Completed/Met   Problem: Nutrition: Goal: Adequate nutrition will be maintained Outcome: Completed/Met   Problem: Coping: Goal: Level of anxiety will decrease Outcome: Completed/Met   Problem: Safety: Goal: Ability to remain free from injury will improve Outcome: Completed/Met   Problem: Skin Integrity: Goal: Risk for impaired skin integrity will decrease Outcome: Completed/Met

## 2018-09-20 NOTE — Discharge Instructions (Signed)
Resume diet and activity as before ° ° °

## 2018-09-20 NOTE — Progress Notes (Signed)
IV and tele removed from patient. Discharge instructions given to patient. Verbalized understanding. No acute distress at this time. Family at bedside and will transport patient home.  

## 2018-09-21 LAB — HIV ANTIBODY (ROUTINE TESTING W REFLEX): HIV Screen 4th Generation wRfx: NONREACTIVE

## 2018-09-28 ENCOUNTER — Other Ambulatory Visit: Payer: Self-pay | Admitting: Family Medicine

## 2018-09-28 NOTE — Telephone Encounter (Signed)
RF request for Lyrica  LOV: 07/02/18 Next ov: 12/31/2018 Last written: 03/23/18 #180 x1 refill   Please Advise

## 2018-09-29 NOTE — Discharge Summary (Signed)
[facility] [place] [facility]   PATIENT NAME: Brenda Schroeder    MR#:  [id number]  DATE OF BIRTH:  Jan 29, 1961  DATE OF ADMISSION:  09/19/2018 ADMITTING PHYSICIAN: Arnaldo Natal, MD  DATE OF DISCHARGE: 09/20/2018  6:19 PM  PRIMARY CARE PHYSICIAN: Jeoffrey Massed, MD   ADMISSION DIAGNOSIS:  Palpitations [R00.2] Chest pain, unspecified type [R07.9]  DISCHARGE DIAGNOSIS:  Active Problems:   Chest pain   SECONDARY DIAGNOSIS:   Past Medical History:  Diagnosis Date  . Allergy   . Asthma   . Fibromyalgia 2017  . Herniated disc L4-L5  . High triglycerides    mild.  TLC.  Marland Kitchen Hypertension   . Hypothyroidism   . IBS (irritable bowel syndrome)   . Internal hemorrhoids   . Neuropathic pain    Rx'd neurontin by her orthopedist  . Sprain of posterior cruciate ligament of knee right knee  . Vitamin D deficiency 02/2017   high dose vit D started 02/23/17--vit D still low on recheck.  Needs 50K twice per week dosing indefinitely. Vit D level mid 40s Nov 2019.     ADMITTING HISTORY  Chief Complaint: Palpitations HPI: The patient with past medical history of asthma, hypertension and hypothyroidism presents to the emergency department due to palpitations.  The patient states that she felt her heart racing as she lay down to go to sleep.  Her heartbeat was so fast that occasionally she admits to vague substernal chest pain.  She is chest pain-free at this time however laboratory evaluation showed slight bump in troponin.  Due to concern for possible tacky arrhythmias as well as symptoms consistent with anginal equivalents the emergency department staff started the patient on a heparin drip prior to calling the hospitalist service for further evaluation.  HOSPITAL COURSE:   *SVT Patient did not have any episodes of tachycardia in the hospital.  Started on Toprol XL 12.5 mg daily.  Heart rate continues to be in the high 50s to 60s.  Asymptomatic.  Troponins  normal.  Patient complained of some chest pain and was seen by cardiology Dr. Lady Gary.  Recommended getting a stress test.  This was low risk with no ischemic changes found.  Patient will follow-up with primary care physician and cardiology as outpatient.  Other comorbidities remained stable.  Discharged home in stable condition  CONSULTS OBTAINED:  Treatment Team:  Dalia Heading, MD  DRUG ALLERGIES:   Allergies  Allergen Reactions  . Aspirin Shortness Of Breath and Other (See Comments)    nasal congestion  . Nsaids Anaphylaxis  . Sulfa Antibiotics Rash    DISCHARGE MEDICATIONS:   Allergies as of 09/20/2018      Reactions   Aspirin Shortness Of Breath, Other (See Comments)   nasal congestion   Nsaids Anaphylaxis   Sulfa Antibiotics Rash      Medication List    TAKE these medications   calcium carbonate 500 MG chewable tablet Commonly known as:  TUMS - dosed in mg elemental calcium Chew 1 tablet by mouth as needed for indigestion or heartburn.   DULoxetine 60 MG capsule Commonly known as:  CYMBALTA TAKE 1 CAPSULE BY MOUTH EVERY DAY   levothyroxine 175 MCG tablet Commonly known as:  SYNTHROID, LEVOTHROID Take 1 tablet (175 mcg total) by mouth daily before breakfast. What changed:    medication strength  how much to take  when to take this   metoprolol succinate 25 MG 24 hr tablet Commonly known as:  TOPROL-XL Take 0.5 tablets (12.5 mg total) by mouth daily.   Vitamin D (Ergocalciferol) 1.25 MG (50000 UT) Caps capsule Commonly known as:  DRISDOL Take one capsule twice a week What changed:    how much to take  how to take this  when to take this  additional instructions       Today   VITAL SIGNS:  Blood pressure (!) 138/91, pulse 61, temperature 98.4 F (36.9 C), temperature source Oral, resp. rate 19, height 5' 2.5" (1.588 m), weight (!) 138.9 kg, SpO2 95 %.  I/O:  No intake or output data in the 24 hours ending 09/29/18 0208  PHYSICAL  EXAMINATION:  Physical Exam  GENERAL:  58 y.o.-year-old patient lying in the bed with no acute distress.  LUNGS: Normal breath sounds bilaterally, no wheezing, rales,rhonchi or crepitation. No use of accessory muscles of respiration.  CARDIOVASCULAR: S1, S2 normal. No murmurs, rubs, or gallops.  ABDOMEN: Soft, non-tender, non-distended. Bowel sounds present. No organomegaly or mass.  NEUROLOGIC: Moves all 4 extremities. PSYCHIATRIC: The patient is alert and oriented x 3.  SKIN: No obvious rash, lesion, or ulcer.   DATA REVIEW:   CBC No results for input(s): WBC, HGB, HCT, PLT in the last 168 hours.  Chemistries  No results for input(s): NA, K, CL, CO2, GLUCOSE, BUN, CREATININE, CALCIUM, MG, AST, ALT, ALKPHOS, BILITOT in the last 168 hours.  Invalid input(s): GFRCGP  Cardiac Enzymes No results for input(s): TROPONINI in the last 168 hours.  Microbiology Results  Results for orders placed or performed in visit on 07/26/12  Urine culture     Status: None   Collection Time: 07/26/12  3:51 PM  Result Value Ref Range Status   Colony Count 20,OOO COLONIES/ML  Final   Organism ID, Bacteria Multiple bacterial morphotypes present, none  Final   Organism ID, Bacteria predominant. Suggest appropriate recollection if   Final   Organism ID, Bacteria clinically indicated.  Final    RADIOLOGY:  No results found.  Follow up with PCP in 1 week.  Management plans discussed with the patient, family and they are in agreement.  CODE STATUS:  Code Status History    Date Active Date Inactive Code Status Order ID Comments User Context   09/19/2018 0636 09/20/2018 2119 Full Code [id number]  Arnaldo Natal, MD Inpatient    Advance Directive Documentation     Most Recent Value  Type of Advance Directive  Living will  Pre-existing out of facility DNR order (yellow form or pink MOST form)  -  "MOST" Form in Place?  -      TOTAL TIME TAKING CARE OF THIS PATIENT ON DAY OF DISCHARGE: more than  30 minutes.   Orie Fisherman M.D on 09/29/2018 at 2:08 AM  Between 7am to 6pm - Pager - [phone]  After 6pm go to [email or web address] - password [email or web address]  [facility]  Office  [phone]  CC: Primary care physician; Jeoffrey Massed, MD  Note: This dictation was prepared with Dragon dictation along with smaller phrase technology. Any transcriptional errors that result from this process are unintentional.

## 2018-12-31 ENCOUNTER — Other Ambulatory Visit: Payer: Self-pay

## 2018-12-31 ENCOUNTER — Encounter: Payer: Self-pay | Admitting: Family Medicine

## 2018-12-31 ENCOUNTER — Ambulatory Visit (INDEPENDENT_AMBULATORY_CARE_PROVIDER_SITE_OTHER): Payer: Federal, State, Local not specified - PPO | Admitting: Family Medicine

## 2018-12-31 VITALS — BP 151/91 | HR 98 | Temp 97.3°F | Wt 304.0 lb

## 2018-12-31 DIAGNOSIS — I10 Essential (primary) hypertension: Secondary | ICD-10-CM | POA: Diagnosis not present

## 2018-12-31 DIAGNOSIS — M797 Fibromyalgia: Secondary | ICD-10-CM | POA: Diagnosis not present

## 2018-12-31 DIAGNOSIS — E039 Hypothyroidism, unspecified: Secondary | ICD-10-CM

## 2018-12-31 DIAGNOSIS — R002 Palpitations: Secondary | ICD-10-CM

## 2018-12-31 MED ORDER — HYDROCHLOROTHIAZIDE 25 MG PO TABS: 25.0000 mg | ORAL_TABLET | Freq: Every day | ORAL | 0 refills | Status: DC

## 2018-12-31 MED ORDER — METOPROLOL SUCCINATE ER 25 MG PO TB24: 12.5000 mg | ORAL_TABLET | Freq: Every day | ORAL | 6 refills | Status: DC

## 2018-12-31 NOTE — Progress Notes (Signed)
Virtual Visit via Video Note  I connected with pt  on 12/31/18 at  9:00 AM EDT by a video enabled telemedicine application and verified that I am speaking with the correct person using two identifiers.  Location patient: home Location provider:work or home office Persons participating in the virtual visit: patient, provider  I discussed the limitations of evaluation and management by telemedicine and the availability of in person appointments. The patient expressed understanding and agreed to proceed.   HPI: 58 y/o WF being seen today for 6 mo f/u HTN, hypothyroidism, and fibromyalgia syndrome.  Interim hx: Pt was hospitalized 1/12-1/13, 2020 for palpitations and chest pain. TnI mildly elevated.  No arrhythmias on telemetry, stress test low risk, echo with some DD and LVH but o/w normal on technically difficult study. TnI did not trend up.  Pt was put on toprol xl 12.5mg  qd->HR 50s-60s. Dr. Rushie Goltz was the cardiologist who saw her [facility]). Her TSH was around 10 (she intermittently takes T4 w/all her other meds, but does take it daily) so her T4 dose was increased to 175 mcg qd at d/c.  Currently: Feeling ok. Had a bad day of fibromyalgia pain yesterday so she stayed home from work and laid in bed most of her day.  She had missed a dose of lyrica prior so this may have been a factor.  She is not taking the toprol xl b/c rx was unable to be renewed.  She has been off of the med for 1 mo or more.  She was feeling less palpitations on it.  One episode of palpitations since getting off of it.  She took one of her husband's metop pills and this helped.  She is also not taking the increased dose of T4. Instead, she got RF on the 150s and tried to take it very strictly the correct way instead of going up on dose.  HTN: no home bp monitoring.  Wt fluctuates 3 lb day to day at times w/out notable change in caloric intake or activity level to explain this. She is not really  cognizant of the Na in her diet for the most part.  ROS: See pertinent positives and negatives per HPI.  Past Medical History:  Diagnosis Date  . Allergy   . Asthma   . Fibromyalgia 2017  . Herniated disc L4-L5  . High triglycerides    mild.  TLC.  Marland Kitchen Hypertension   . Hypothyroidism   . IBS (irritable bowel syndrome)   . Internal hemorrhoids   . Neuropathic pain    Rx'd neurontin by her orthopedist  . Palpitations 09/2018   toprol xl started by cardiologist->no tachyarrhythmia on telemetry  . Sprain of posterior cruciate ligament of knee right knee  . Vitamin D deficiency 02/2017   high dose vit D started 02/23/17--vit D still low on recheck.  Needs 50K twice per week dosing indefinitely. Vit D level mid 40s Nov 2019.    Past Surgical History:  Procedure Laterality Date  . CARDIOVASCULAR STRESS TEST  09/19/2018   Low risk  . COLONOSCOPY W/ POLYPECTOMY  09/2012   Dr. Loreta Ave: diverticulosis.  Recall 2024.  Marland Kitchen TOTAL ABDOMINAL HYSTERECTOMY W/ BILATERAL SALPINGOOPHORECTOMY  approx 2006   Nonmalignant reasons  . TRANSTHORACIC ECHOCARDIOGRAM  09/19/2018   EF 60-65%, mild LVH, Grd I DD.    Family History  Problem Relation Age of Onset  . Arthritis Mother        osteo arithritis  . Obesity Mother   .  Fibroids Mother        uterine  . Lymphoma Father   . Sarcoidosis Father   . Pulmonary fibrosis Father   . Arthritis Maternal Grandmother        rheumatoid  . Cancer Maternal Grandmother        cervical  . Heart disease Maternal Grandfather        a-fib  . COPD Maternal Grandfather        emphysema  . Vision loss Paternal Grandmother   . Emphysema Paternal Grandfather   . Lung cancer Paternal Grandfather   . Celiac disease Sister   . Asthma Son   . Cancer Daughter        ovarian  . Obesity Sister      Current Outpatient Medications:  .  calcium carbonate (TUMS - DOSED IN MG ELEMENTAL CALCIUM) 500 MG chewable tablet, Chew 1 tablet by mouth as needed for indigestion or  heartburn., Disp: , Rfl:  .  Cyanocobalamin (VITAMIN B-12) 5000 MCG SUBL, Place under the tongue., Disp: , Rfl:  .  DULoxetine (CYMBALTA) 60 MG capsule, TAKE 1 CAPSULE BY MOUTH EVERY DAY, Disp: 90 capsule, Rfl: 1 .  levothyroxine (SYNTHROID, LEVOTHROID) 175 MCG tablet, Take 1 tablet (175 mcg total) by mouth daily before breakfast. (Patient taking differently: Take 175 mcg by mouth daily before breakfast. Pt takes 150 mcg daily), Disp: 30 tablet, Rfl: 0 .  LYRICA 150 MG capsule, TAKE 1 CAPSULE BY MOUTH TWICE A DAY, Disp: 180 capsule, Rfl: 1 .  THYROID PO, Take by mouth. Take 1 capsule by mouth daily., Disp: , Rfl:  .  Vitamin D, Ergocalciferol, (DRISDOL) 50000 units CAPS capsule, Take one capsule twice a week (Patient taking differently: Take 50,000 Units by mouth 2 (two) times a week. On Sunday and Wednesday), Disp: 24 capsule, Rfl: 3 .  metoprolol succinate (TOPROL-XL) 25 MG 24 hr tablet, Take 0.5 tablets (12.5 mg total) by mouth daily. (Patient not taking: Reported on 12/31/2018), Disp: 30 tablet, Rfl: 0  EXAM:  VITALS per patient if applicable: BP (!) 151/91 (BP Location: Left Arm, Patient Position: Sitting, Cuff Size: Large)   Pulse 98   Temp (!) 97.3 F (36.3 C) (Oral)   Wt (!) 304 lb (137.9 kg)   BMI 54.72 kg/m    GENERAL: alert, oriented, appears well and in no acute distress  HEENT: atraumatic, conjunttiva clear, no obvious abnormalities on inspection of external nose and ears  NECK: normal movements of the head and neck  LUNGS: on inspection no signs of respiratory distress, breathing rate appears normal, no obvious gross SOB, gasping or wheezing  CV: no obvious cyanosis  MS: moves all visible extremities without noticeable abnormality  PSYCH/NEURO: pleasant and cooperative, no obvious depression or anxiety, speech and thought processing grossly intact  LABS: none today    Chemistry      Component Value Date/Time   NA 140 09/19/2018 0117   K 3.5 09/19/2018 0117   CL  106 09/19/2018 0117   CO2 26 09/19/2018 0117   BUN 22 (H) 09/19/2018 0117   CREATININE 0.90 09/19/2018 0117   CREATININE 0.68 10/05/2013 0833      Component Value Date/Time   CALCIUM 9.7 09/19/2018 0117   ALKPHOS 65 09/19/2018 0117   AST 22 09/19/2018 0117   ALT 22 09/19/2018 0117   BILITOT 0.6 09/19/2018 0117     Lab Results  Component Value Date   HGBA1C 5.3 09/19/2018   Lab Results  Component Value Date  TSH 10.793 (H) 09/19/2018   Lab Results  Component Value Date   CHOL 250 (H) 07/09/2018   HDL 40.70 07/09/2018   LDLCALC 76 10/05/2013   LDLDIRECT 68.0 07/09/2018   TRIG 350.0 (H) 07/09/2018   CHOLHDL 6 07/09/2018   Lab Results  Component Value Date   WBC 8.3 09/19/2018   HGB 14.5 09/19/2018   HCT 45.9 09/19/2018   MCV 86.0 09/19/2018   PLT 344 09/19/2018   Lab Results  Component Value Date   CKTOTAL 78 05/16/2016   TROPONINI <0.03 09/19/2018    ASSESSMENT AND PLAN:  Discussed the following assessment and plan:  1) Palpitations: sinus tach. Was improved while on toprol xl 12.5mg  qd, so we'll get her back on this.  2) HTN: a bit up today, otherwise no monitoring at home or elsewhere to go by to determine control. Plan to start hctz 25mg  qd in addition to restarting her low dose toprol as in #1 above. We'll have bmet checked a little before her next f/u in 2 wks. We discussed her waxing and waning fluid retention and I emphasized the important role of Na restriction with this.  Hopefully the hctz will help some with her fluid retention as well.  3) Fibromyalgia: overall pretty stable at this time on lyrica and cymbalta. No change in meds at this time.  4) Hypothyroidism: she was not consistently taking her med correctly, so she has been working on this with her usual dose of 150 mcg qd INSTEAD of going up to the 175 mcg dose rx'd upon d/c from recent hospitalization. We'll check TSH in a couple weeks before her next f/u.   I discussed the assessment  and treatment plan with the patient. The patient was provided an opportunity to ask questions and all were answered. The patient agreed with the plan and demonstrated an understanding of the instructions.   The patient was advised to call back or seek an in-person evaluation if the symptoms worsen or if the condition fails to improve as anticipated.  F/u: 2-3 wks RCI  Signed:  Santiago BumpersPhil Vicente Weidler, MD           12/31/2018

## 2019-01-01 ENCOUNTER — Other Ambulatory Visit: Payer: Self-pay | Admitting: Family Medicine

## 2019-01-03 NOTE — Telephone Encounter (Signed)
RF request for Vit D LOV: 12/31/18 Next ov: 01/20/19 Last written: 01/05/18 #24 w/ 3RF, 90 d supply and last Vit D check 07/09/18  Please advise if pt still needs to take? Medication pending.

## 2019-01-04 ENCOUNTER — Telehealth: Payer: Self-pay

## 2019-01-04 NOTE — Telephone Encounter (Signed)
Insurance has been added to visit

## 2019-01-04 NOTE — Telephone Encounter (Signed)
SW pt and given updated insurance info. [employer]-FEP ID # [id number]  Copied from CRM [id number]. Topic: General - Other >> Jan 04, 2019 12:17 PM Lynne Logan D wrote: Reason for CRM: Pt called to give updated insurance information. She did not have this before her appointment on 12/31/18. Office phones not working. Please reach out to pt.

## 2019-01-04 NOTE — Telephone Encounter (Signed)
Pt was made aware.  

## 2019-01-05 NOTE — Telephone Encounter (Signed)
Patient request to have labs drawn in [place] location. Please change lab orders from "clinic collect" to future collect".  She will be going on Monday 01/17/19  Thank you

## 2019-01-06 ENCOUNTER — Other Ambulatory Visit: Payer: Self-pay

## 2019-01-06 DIAGNOSIS — I10 Essential (primary) hypertension: Secondary | ICD-10-CM

## 2019-01-06 DIAGNOSIS — R002 Palpitations: Secondary | ICD-10-CM

## 2019-01-06 DIAGNOSIS — E039 Hypothyroidism, unspecified: Secondary | ICD-10-CM

## 2019-01-06 NOTE — Telephone Encounter (Signed)
Okay for labs to be drawn at [place] location instead?   If so, please change labs to future collect.

## 2019-01-06 NOTE — Telephone Encounter (Signed)
This is ok. Can you change these lab orders for me?

## 2019-01-17 ENCOUNTER — Other Ambulatory Visit: Payer: Self-pay | Admitting: Family Medicine

## 2019-01-17 ENCOUNTER — Other Ambulatory Visit: Payer: Self-pay

## 2019-01-17 DIAGNOSIS — I10 Essential (primary) hypertension: Secondary | ICD-10-CM | POA: Diagnosis not present

## 2019-01-17 DIAGNOSIS — R002 Palpitations: Secondary | ICD-10-CM

## 2019-01-17 DIAGNOSIS — E039 Hypothyroidism, unspecified: Secondary | ICD-10-CM

## 2019-01-17 LAB — BASIC METABOLIC PANEL
BUN: 18 mg/dL (ref 7–25)
CO2: 31 mmol/L (ref 20–32)
Calcium: 9.9 mg/dL (ref 8.6–10.4)
Chloride: 106 mmol/L (ref 98–110)
Creat: 0.75 mg/dL (ref 0.50–1.05)
Glucose, Bld: 99 mg/dL (ref 65–99)
Potassium: 4.6 mmol/L (ref 3.5–5.3)
Sodium: 144 mmol/L (ref 135–146)

## 2019-01-17 LAB — TSH: TSH: 5.16 mIU/L — ABNORMAL HIGH (ref 0.40–4.50)

## 2019-01-18 NOTE — Progress Notes (Signed)
Attempted to contact patient, line busy

## 2019-01-20 ENCOUNTER — Other Ambulatory Visit: Payer: Self-pay

## 2019-01-20 ENCOUNTER — Encounter: Payer: Self-pay | Admitting: Family Medicine

## 2019-01-20 ENCOUNTER — Ambulatory Visit (INDEPENDENT_AMBULATORY_CARE_PROVIDER_SITE_OTHER): Payer: Federal, State, Local not specified - PPO | Admitting: Family Medicine

## 2019-01-20 DIAGNOSIS — R002 Palpitations: Secondary | ICD-10-CM | POA: Diagnosis not present

## 2019-01-20 DIAGNOSIS — E039 Hypothyroidism, unspecified: Secondary | ICD-10-CM

## 2019-01-20 DIAGNOSIS — I10 Essential (primary) hypertension: Secondary | ICD-10-CM | POA: Diagnosis not present

## 2019-01-20 MED ORDER — LEVOTHYROXINE SODIUM 200 MCG PO TABS: 200.0000 ug | ORAL_TABLET | Freq: Every day | ORAL | 1 refills | Status: DC

## 2019-01-20 NOTE — Progress Notes (Signed)
Virtual Visit via Video Note  I connected with pt on 01/20/19 at  4:00 PM EDT by a video enabled telemedicine application and verified that I am speaking with the correct person using two identifiers.  Location patient: home Location provider:work or home office Persons participating in the virtual visit: patient, provider  I discussed the limitations of evaluation and management by telemedicine and the availability of in person appointments. The patient expressed understanding and agreed to proceed.  Telemedicine visit is a necessity given the COVID-19 restrictions in place at the current time.  HPI: 58 y/o WF being seen today for 3 week f/u HTN, hypothyroidism, and palpitations. [facility]  HTN: I started her on hctz 25mg  qd last visit, also restarted toprol xl 12.5mg  qd that she had been taking for her palpitations/sinus tach. No home bp monitoring.  Doesn't feel as out of breath when she walks.  No adverse side effects from the med.  Lost some water wt (3 lbs).  Palpitations: sinus tachy.  Toprol XL 1/2 of 25mg  tab qd helpful 09/2018 to 11/2018. Occ palps off med x 1 mo so I restarted this last visit.  Recent labs 01/17/2019 showed normal BMET and TSH down to 5.16 (was 10.8, 09/19/2018-->she did not change dose but chose to try to take the med correctly every day instead).   ROS: no CP, no SOB, no wheezing, no cough, no dizziness, no HAs, no rashes, no melena/hematochezia.  No polyuria or polydipsia.     Past Medical History:  Diagnosis Date  . Allergy   . Asthma   . Fibromyalgia 2017  . Herniated disc L4-L5  . High triglycerides    mild.  TLC.  Marland Kitchen. Hypertension   . Hypothyroidism   . IBS (irritable bowel syndrome)   . Internal hemorrhoids   . Neuropathic pain    Rx'd neurontin by her orthopedist  . Palpitations 09/2018   toprol xl started by cardiologist->no tachyarrhythmia on telemetry  . Sprain of posterior cruciate ligament of knee right  knee  . Vitamin D deficiency 02/2017   high dose vit D started 02/23/17--vit D still low on recheck.  Needs 50K twice per week dosing indefinitely. Vit D level mid 40s Nov 2019.    Past Surgical History:  Procedure Laterality Date  . CARDIOVASCULAR STRESS TEST  09/19/2018   Low risk  . COLONOSCOPY W/ POLYPECTOMY  09/2012   Dr. Loreta AveMann: diverticulosis.  Recall 2024.  Marland Kitchen. TOTAL ABDOMINAL HYSTERECTOMY W/ BILATERAL SALPINGOOPHORECTOMY  approx 2006   Nonmalignant reasons  . TRANSTHORACIC ECHOCARDIOGRAM  09/19/2018   EF 60-65%, mild LVH, Grd I DD.    Family History  Problem Relation Age of Onset  . Arthritis Mother        osteo arithritis  . Obesity Mother   . Fibroids Mother        uterine  . Lymphoma Father   . Sarcoidosis Father   . Pulmonary fibrosis Father   . Arthritis Maternal Grandmother        rheumatoid  . Cancer Maternal Grandmother        cervical  . Heart disease Maternal Grandfather        a-fib  . COPD Maternal Grandfather        emphysema  . Vision loss Paternal Grandmother   . Emphysema Paternal Grandfather   . Lung cancer Paternal Grandfather   . Celiac disease Sister   . Asthma Son   . Cancer Daughter  ovarian  . Obesity Sister      Current Outpatient Medications:  .  calcium carbonate (TUMS - DOSED IN MG ELEMENTAL CALCIUM) 500 MG chewable tablet, Chew 1 tablet by mouth as needed for indigestion or heartburn., Disp: , Rfl:  .  Cyanocobalamin (VITAMIN B-12) 5000 MCG SUBL, Place under the tongue., Disp: , Rfl:  .  DULoxetine (CYMBALTA) 60 MG capsule, TAKE 1 CAPSULE BY MOUTH EVERY DAY, Disp: 90 capsule, Rfl: 1 .  hydrochlorothiazide (HYDRODIURIL) 25 MG tablet, Take 1 tablet (25 mg total) by mouth daily., Disp: 30 tablet, Rfl: 0 .  LYRICA 150 MG capsule, TAKE 1 CAPSULE BY MOUTH TWICE A DAY, Disp: 180 capsule, Rfl: 1 .  metoprolol succinate (TOPROL-XL) 25 MG 24 hr tablet, Take 0.5 tablets (12.5 mg total) by mouth daily., Disp: 30 tablet, Rfl: 6 .  THYROID  PO, Take by mouth. Take 1 capsule by mouth daily., Disp: , Rfl:  .  Vitamin D, Ergocalciferol, (DRISDOL) 1.25 MG (50000 UT) CAPS capsule, TAKE ONE CAPSULE TWICE A WEEK, Disp: 24 capsule, Rfl: 3 .  levothyroxine (SYNTHROID) 200 MCG tablet, Take 1 tablet (200 mcg total) by mouth daily before breakfast., Disp: 90 tablet, Rfl: 1  EXAM:  VITALS per patient if applicable: There were no vitals taken for this visit.   GENERAL: alert, oriented, appears well and in no acute distress  HEENT: atraumatic, conjunttiva clear, no obvious abnormalities on inspection of external nose and ears  NECK: normal movements of the head and neck  LUNGS: on inspection no signs of respiratory distress, breathing rate appears normal, no obvious gross SOB, gasping or wheezing  CV: no obvious cyanosis  MS: moves all visible extremities without noticeable abnormality  PSYCH/NEURO: pleasant and cooperative, no obvious depression or anxiety, speech and thought processing grossly intact  LABS: none today    Chemistry      Component Value Date/Time   NA 144 01/17/2019 0910   K 4.6 01/17/2019 0910   CL 106 01/17/2019 0910   CO2 31 01/17/2019 0910   BUN 18 01/17/2019 0910   CREATININE 0.75 01/17/2019 0910      Component Value Date/Time   CALCIUM 9.9 01/17/2019 0910   ALKPHOS 65 09/19/2018 0117   AST 22 09/19/2018 0117   ALT 22 09/19/2018 0117   BILITOT 0.6 09/19/2018 0117     Lab Results  Component Value Date   TSH 5.16 (H) 01/17/2019    ASSESSMENT AND PLAN:  Discussed the following assessment and plan:  1) HTN: feeling better on current regimen. She'll try to get a few bp's at home to make sure it is wnl. Recent lytes/cr good.  Reviewed these labs with pt today.  2) Palpitations: responding well to 12.5mg  toprol qd.  3) Hypothyroidism: doing well.  TSH just a touch high a few days ago.  Reviewed with pt. Increasing T4 to 200 mcg and will recheck TSH in 6 wks.   I discussed the assessment and  treatment plan with the patient. The patient was provided an opportunity to ask questions and all were answered. The patient agreed with the plan and demonstrated an understanding of the instructions.   The patient was advised to call back or seek an in-person evaluation if the symptoms worsen or if the condition fails to improve as anticipated.  F/u: 3 mo RCI  Signed:  Santiago Bumpers, MD           01/20/2019

## 2019-01-23 ENCOUNTER — Other Ambulatory Visit: Payer: Self-pay | Admitting: Family Medicine

## 2019-03-09 ENCOUNTER — Telehealth: Payer: Self-pay | Admitting: Family Medicine

## 2019-03-09 NOTE — Telephone Encounter (Signed)
Order has been faxed to [facility] in [place] using fax number provided.

## 2019-03-09 NOTE — Telephone Encounter (Signed)
States that she was speaking with someone this morning regarding orders being faxed to [facility] in [place]. Patient was to call back and give fax number. I cannot find any notes of whom she was speaking to. There is an order for TSH on profile. It will need to be changed and faxed.  Please fax order to [phone].

## 2019-03-09 NOTE — Telephone Encounter (Signed)
2nd request to fax order for labs  Thank you

## 2019-03-24 ENCOUNTER — Telehealth: Payer: Self-pay

## 2019-03-24 NOTE — Telephone Encounter (Signed)
LM for pt's husband, Jeneen Rinks regarding form completion. Advised in voicemail available for pick up.

## 2019-03-24 NOTE — Telephone Encounter (Signed)
Form completed and put in box to go up front. 

## 2019-03-24 NOTE — Telephone Encounter (Signed)
Received form for pt, PCP will review and sign, if appropriate.

## 2019-04-21 ENCOUNTER — Other Ambulatory Visit: Payer: Self-pay | Admitting: Family Medicine

## 2019-04-21 NOTE — Telephone Encounter (Signed)
Called patient and lvm. Patient will need to schedule a OV for [facility] before anymore refills will be sent. I sent 30 days for patient until she can get in to see [place].

## 2019-04-23 ENCOUNTER — Other Ambulatory Visit: Payer: Self-pay | Admitting: Family Medicine

## 2019-04-25 NOTE — Telephone Encounter (Signed)
RF request for Lyrica 150 mg LOV: 01/20/19 Next ov: not scheduled Last written: 09/28/18 #180 x 1 refill   Please advise, thank you

## 2019-05-02 ENCOUNTER — Encounter: Payer: Self-pay | Admitting: Family Medicine

## 2019-05-13 ENCOUNTER — Other Ambulatory Visit: Payer: Self-pay | Admitting: Family Medicine

## 2019-05-17 ENCOUNTER — Ambulatory Visit (INDEPENDENT_AMBULATORY_CARE_PROVIDER_SITE_OTHER): Payer: Federal, State, Local not specified - PPO | Admitting: Medical

## 2019-05-17 ENCOUNTER — Encounter: Payer: Self-pay | Admitting: Medical

## 2019-05-17 ENCOUNTER — Other Ambulatory Visit: Payer: Self-pay

## 2019-05-17 VITALS — BP 138/71 | HR 88 | Temp 97.9°F

## 2019-05-17 DIAGNOSIS — M25562 Pain in left knee: Secondary | ICD-10-CM | POA: Diagnosis not present

## 2019-05-17 DIAGNOSIS — M79662 Pain in left lower leg: Secondary | ICD-10-CM | POA: Diagnosis not present

## 2019-05-17 NOTE — Progress Notes (Signed)
Subjective:    Patient ID: Brenda Schroeder, female    DOB: 03/22/1961, 58 y.o.   MRN: [id number]  HPI  Virtual Visit via Video Note  I connected with Brenda Schroeder on 05/17/19 at  4:00 PM EDT by a video enabled telemedicine application and verified that I am speaking with the correct person using two identifiers.  Location: Patient: home Provider: office   I discussed the limitations of evaluation and management by telemedicine and the availability of in person appointments. The patient expressed understanding and agreed to proceed.  History of Present Illness:  Pt states last night was putting some clothes in dresser drawer. She states she accidentally fell backwards trying not to trip over her do. She fell back and twisted landing on carpet. Pt did have some tail bone/coccyx area pain. Pt states some mild left calf pain that radiates to her left mid aspect. Twinge pain left mid calf at times. Hurts intermittently. Her knee hurts just little. Pt thinks slight bruise possible. Pt thinks her left calf region might be more swollen.  Pt low back has been hurting some but hx of back pain in the past before fall and hx of fibromyalgia.  When she fell yesterday did not suffer any head trauma.  No loss of consciousness.     Observations/Objective: General-no acute distress, pleasant, oriented. Lungs- on inspection lungs appear unlabored. Neck- no tracheal deviation or jvd on inspection. Neuro- gross motor function appears intact. Lower extremity-patient did not show me her lower extremities but she reports faint bruise possible to left calf.  No pain on standing on her tiptoes but random transient pain left mid calf when she walks around the house.  Faint medial aspect knee pain when she walks around the house as well.  She thinks her left calf might be more swollen than her right.  Back- no coccyx tenderness presently.  No change in her baseline lumbar region chronic back pain.   Assessment and Plan: For your recent fall yesterday with some mild left calf pain and mild medial left knee pain, I do think it is best for you to go ahead and get left lower extremity ultrasound and left knee x-ray.  I placed the order to be done at [facility].  Want you to call them today and get scheduled for those test tomorrow.  If those test cannot be done tomorrow then please let me know and I would call the facility directly.  Today's visit was virtual visit and you will have high suspicion for injury but uncomfortable not doing studies in light of the fact that exam is limited due to nature of this method.  For mild change in calf pain you can use Tylenol over-the-counter.  You mentioned in the past and you can tolerate Voltaren gel topically would recommend that you use that twice daily to the mid calf area.  Also might benefit from Ace bandage compression of the calf area.  We will follow both ultrasound of lower extremity and left knee x-ray.  He has posterior pain yesterday but none today.  If you do have recurrent cough explained that you know and I will place that x-ray order.  Follow-up in 6 days or as needed.  Esperanza RichtersEdward Griffin Gerrard, PA-C  Follow Up Instructions:    I discussed the assessment and treatment plan with the patient. The patient was provided an opportunity to ask questions and all were answered. The patient agreed with the plan and demonstrated an understanding of  the instructions.   The patient was advised to call back or seek an in-person evaluation if the symptoms worsen or if the condition fails to improve as anticipated.  I provided 25  minutes of non-face-to-face time during this encounter.   Mackie Pai, PA-C    Review of Systems  Constitutional: Negative for chills and fever.  Respiratory: Negative for cough, chest tightness, shortness of breath and wheezing.   Cardiovascular: Negative for chest pain and palpitations.  Gastrointestinal:  Negative for abdominal pain.  Musculoskeletal:       See HPI.  Skin: Negative for rash.  Neurological: Negative for dizziness and headaches.  Hematological: Negative for adenopathy. Does not bruise/bleed easily.    Past Medical History:  Diagnosis Date  . Allergy   . Asthma   . Fibromyalgia 2017  . Herniated disc L4-L5  . High triglycerides    mild.  TLC.  Marland Kitchen Hypertension   . Hypothyroidism   . IBS (irritable bowel syndrome)   . Internal hemorrhoids   . Neuropathic pain    Rx'd neurontin by her orthopedist  . Palpitations 09/2018   toprol xl started by cardiologist->no tachyarrhythmia on telemetry  . Sprain of posterior cruciate ligament of knee right knee  . Vitamin D deficiency 02/2017   high dose vit D started 02/23/17--vit D still low on recheck.  Needs 50K twice per week dosing indefinitely. Vit D level mid 40s Nov 2019.     Social History   Socioeconomic History  . Marital status: Married    Spouse name: Not on file  . Number of children: Not on file  . Years of education: Not on file  . Highest education level: Not on file  Occupational History  . Not on file  Social Needs  . Financial resource strain: Not on file  . Food insecurity    Worry: Not on file    Inability: Not on file  . Transportation needs    Medical: Not on file    Non-medical: Not on file  Tobacco Use  . Smoking status: Never Smoker  . Smokeless tobacco: Never Used  Substance and Sexual Activity  . Alcohol use: No    Comment: rarely  . Drug use: No  . Sexual activity: Yes    Partners: Male    Birth control/protection: None  Lifestyle  . Physical activity    Days per week: Not on file    Minutes per session: Not on file  . Stress: Not on file  Relationships  . Social Herbalist on phone: Not on file    Gets together: Not on file    Attends religious service: Not on file    Active member of club or organization: Not on file    Attends meetings of clubs or organizations:  Not on file    Relationship status: Not on file  . Intimate partner violence    Fear of current or ex partner: Not on file    Emotionally abused: Not on file    Physically abused: Not on file    Forced sexual activity: Not on file  Other Topics Concern  . Not on file  Social History Narrative   Married 26 yrs, 3 children (1 daught, 2 sons).   [place]ig from [place].   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   No tobacco, very rare alcohol, no drugs.          Past Surgical History:  Procedure Laterality Date  . CARDIOVASCULAR STRESS TEST  09/19/2018   Low risk  . COLONOSCOPY W/ POLYPECTOMY  09/2012   Dr. Loreta Ave: diverticulosis.  Recall 2024.  Marland Kitchen TOTAL ABDOMINAL HYSTERECTOMY W/ BILATERAL SALPINGOOPHORECTOMY  approx 2006   Nonmalignant reasons  . TRANSTHORACIC ECHOCARDIOGRAM  09/19/2018   EF 60-65%, mild LVH, Grd I DD.    Family History  Problem Relation Age of Onset  . Arthritis Mother        osteo arithritis  . Obesity Mother   . Fibroids Mother        uterine  . Lymphoma Father   . Sarcoidosis Father   . Pulmonary fibrosis Father   . Arthritis Maternal Grandmother        rheumatoid  . Cancer Maternal Grandmother        cervical  . Heart disease Maternal Grandfather        a-fib  . COPD Maternal Grandfather        emphysema  . Vision loss Paternal Grandmother   . Emphysema Paternal Grandfather   . Lung cancer Paternal Grandfather   . Celiac disease Sister   . Asthma Son   . Cancer Daughter        ovarian  . Obesity Sister     Allergies  Allergen Reactions  . Aspirin Shortness Of Breath and Other (See Comments)    nasal congestion  . Nsaids Anaphylaxis  . Sulfa Antibiotics Rash    Current Outpatient Medications on File Prior to Visit  Medication Sig Dispense Refill  . calcium carbonate (TUMS - DOSED IN MG ELEMENTAL CALCIUM) 500 MG chewable tablet Chew 1 tablet by mouth as needed for indigestion or heartburn.    .  Cyanocobalamin (VITAMIN B-12) 5000 MCG SUBL Place under the tongue.    . DULoxetine (CYMBALTA) 60 MG capsule TAKE 1 CAPSULE BY MOUTH EVERY DAY 30 capsule 0  . hydrochlorothiazide (HYDRODIURIL) 25 MG tablet TAKE 1 TABLET BY MOUTH EVERY DAY 30 tablet 0  . levothyroxine (SYNTHROID) 200 MCG tablet Take 1 tablet (200 mcg total) by mouth daily before breakfast. 90 tablet 1  . LYRICA 150 MG capsule TAKE 1 CAPSULE BY MOUTH TWICE A DAY 180 capsule 1  . metoprolol succinate (TOPROL-XL) 25 MG 24 hr tablet Take 0.5 tablets (12.5 mg total) by mouth daily. 30 tablet 6  . THYROID PO Take by mouth. Take 1 capsule by mouth daily.    . Vitamin D, Ergocalciferol, (DRISDOL) 1.25 MG (50000 UT) CAPS capsule TAKE ONE CAPSULE TWICE A WEEK 24 capsule 3   No current facility-administered medications on file prior to visit.     BP 138/71   Pulse 88   Temp 97.9 F (36.6 C) (Temporal)   SpO2 98%       Objective:   Physical Exam        Assessment & Plan:

## 2019-05-17 NOTE — Patient Instructions (Signed)
For your recent fall yesterday with some mild left calf pain and mild medial left knee pain, I do think it is best for you to go ahead and get left lower extremity ultrasound and left knee x-ray.  I placed the order to be done at [facility].  Want you to call them today and get scheduled for those test tomorrow.  If those test cannot be done tomorrow then please let me know and I would call the facility directly.  Today's visit was virtual visit and you will have high suspicion for injury but uncomfortable not doing studies in light of the fact that exam is limited due to nature of this method.  For mild change in calf pain you can use Tylenol over-the-counter.  You mentioned in the past and you can tolerate Voltaren gel topically would recommend that you use that twice daily to the mid calf area.  Also might benefit from Ace bandage compression of the calf area.  We will follow both ultrasound of lower extremity and left knee x-ray.  He has posterior pain yesterday but none today.  If you do have recurrent cough explained that you know and I will place that x-ray order.  Follow-up in 6 days or as needed.

## 2019-05-18 ENCOUNTER — Ambulatory Visit
Admission: RE | Admit: 2019-05-18 | Discharge: 2019-05-18 | Disposition: A | Discharge status: Home / Self Care | Payer: Federal, State, Local not specified - PPO | Source: Ambulatory Visit | Attending: Medical | Admitting: Medical

## 2019-05-18 ENCOUNTER — Other Ambulatory Visit: Payer: Self-pay

## 2019-05-18 DIAGNOSIS — M79662 Pain in left lower leg: Secondary | ICD-10-CM | POA: Insufficient documentation

## 2019-05-18 DIAGNOSIS — S8992XA Unspecified injury of left lower leg, initial encounter: Secondary | ICD-10-CM | POA: Diagnosis not present

## 2019-05-18 DIAGNOSIS — M25562 Pain in left knee: Secondary | ICD-10-CM | POA: Insufficient documentation

## 2019-05-19 ENCOUNTER — Encounter: Payer: Self-pay | Admitting: Medical

## 2019-05-24 ENCOUNTER — Other Ambulatory Visit: Payer: Self-pay | Admitting: Family Medicine

## 2019-06-07 ENCOUNTER — Encounter: Payer: Self-pay | Admitting: Family Medicine

## 2019-06-07 ENCOUNTER — Ambulatory Visit: Payer: Federal, State, Local not specified - PPO | Admitting: Family Medicine

## 2019-06-07 ENCOUNTER — Other Ambulatory Visit: Payer: Self-pay

## 2019-06-07 VITALS — BP 109/69 | HR 65 | Temp 98.0°F | Resp 16 | Ht 63.0 in | Wt 313.2 lb

## 2019-06-07 DIAGNOSIS — E039 Hypothyroidism, unspecified: Secondary | ICD-10-CM

## 2019-06-07 DIAGNOSIS — M797 Fibromyalgia: Secondary | ICD-10-CM | POA: Diagnosis not present

## 2019-06-07 DIAGNOSIS — M7918 Myalgia, other site: Secondary | ICD-10-CM | POA: Diagnosis not present

## 2019-06-07 DIAGNOSIS — I10 Essential (primary) hypertension: Secondary | ICD-10-CM

## 2019-06-07 LAB — BASIC METABOLIC PANEL
BUN: 17 mg/dL (ref 6–23)
CO2: 28 mEq/L (ref 19–32)
Calcium: 9.5 mg/dL (ref 8.4–10.5)
Chloride: 102 mEq/L (ref 96–112)
Creatinine, Ser: 0.81 mg/dL (ref 0.40–1.20)
GFR: 72.78 mL/min (ref >60.00)
Glucose, Bld: 89 mg/dL (ref 70–99)
Potassium: 3.8 mEq/L (ref 3.5–5.1)
Sodium: 141 mEq/L (ref 135–145)

## 2019-06-07 LAB — TSH: TSH: 1.34 u[IU]/mL (ref 0.35–4.50)

## 2019-06-07 MED ORDER — TRAMADOL HCL 50 MG PO TABS: ORAL_TABLET | ORAL | 0 refills | Status: DC

## 2019-06-07 NOTE — Progress Notes (Signed)
OFFICE VISIT  06/07/2019   CC:  Chief Complaint  Patient presents with  . Follow-up    RCI, pt is fasting   HPI:    Patient is a 58 y.o. Caucasian female who presents for f/u HTN, hypothyroidism, and fibromyalgia". I last saw her 4 mo ago at which time she felt good.  No change in meds except a slight increase in her synthroid to 200 mcg qd, due for recheck TSH today.  Interim hx:  She had a fall onto left leg 05/17/19 and was seen by Esperanza Richters, PA with [facility], who ordered a left knee plain film and LE venous doppler->both of which were negative.  Fibromyalgia: she is on duloxetine 60 qd and lyrica 150 mg bid. Feels like fibromyalgia is "worse".  Then goes on to describes feeling tingling and numbness in fingers of both hands when she uses hands/fingers repetitively.   Some urgent BMs x 4 in the last 2 -3 mo. One episode and then she's fine.  No abd cramping, no constipation. Diffuse mild worsening of soft tissue body pain present more, esp after doing too much housework or gardening.  The fatigue and fibro fog assoc with this seems to be more significant.  Also, last few days her R LB/gluteal region hurting and worsened with lifting that leg up.  After walking a while it gets better.  No fall preceding this pain but note fall mentioned above 05/17/19.  No radiating pain. No paresthesias.  Biofreeze no help.  No leg weakness except when she feels severe pain.  HTN w/hx of palpitations: no home bp/hr monitoring.  ROS: no CP, no SOB, no wheezing, no cough, no dizziness, no HAs, no rashes, no melena/hematochezia.  No polyuria or polydipsia.     Past Medical History:  Diagnosis Date  . Allergy   . Asthma   . Fibromyalgia 2017  . Herniated disc L4-L5  . High triglycerides    mild.  TLC.  Marland Kitchen Hypertension   . Hypothyroidism   . IBS (irritable bowel syndrome)   . Internal hemorrhoids   . Neuropathic pain    Rx'd neurontin by her orthopedist  . Palpitations 09/2018   toprol xl  started by cardiologist->no tachyarrhythmia on telemetry  . Sprain of posterior cruciate ligament of knee right knee  . Vitamin D deficiency 02/2017   high dose vit D started 02/23/17--vit D still low on recheck.  Needs 50K twice per week dosing indefinitely. Vit D level mid 40s Nov 2019.    Past Surgical History:  Procedure Laterality Date  . CARDIOVASCULAR STRESS TEST  09/19/2018   Low risk  . COLONOSCOPY W/ POLYPECTOMY  09/2012   Dr. Loreta Ave: diverticulosis.  Recall 2024.  Marland Kitchen TOTAL ABDOMINAL HYSTERECTOMY W/ BILATERAL SALPINGOOPHORECTOMY  approx 2006   Nonmalignant reasons  . TRANSTHORACIC ECHOCARDIOGRAM  09/19/2018   EF 60-65%, mild LVH, Grd I DD.    Outpatient Medications Prior to Visit  Medication Sig Dispense Refill  . calcium carbonate (TUMS - DOSED IN MG ELEMENTAL CALCIUM) 500 MG chewable tablet Chew 1 tablet by mouth as needed for indigestion or heartburn.    . Cyanocobalamin (VITAMIN B-12) 5000 MCG SUBL Place under the tongue.    . DULoxetine (CYMBALTA) 60 MG capsule TAKE 1 CAPSULE BY MOUTH EVERY DAY 30 capsule 0  . hydrochlorothiazide (HYDRODIURIL) 25 MG tablet TAKE 1 TABLET BY MOUTH EVERY DAY 30 tablet 0  . levothyroxine (SYNTHROID) 200 MCG tablet Take 1 tablet (200 mcg total) by mouth daily before  breakfast. 90 tablet 1  . LYRICA 150 MG capsule TAKE 1 CAPSULE BY MOUTH TWICE A DAY 180 capsule 1  . metoprolol succinate (TOPROL-XL) 25 MG 24 hr tablet Take 0.5 tablets (12.5 mg total) by mouth daily. 30 tablet 6  . Vitamin D, Ergocalciferol, (DRISDOL) 1.25 MG (50000 UT) CAPS capsule TAKE ONE CAPSULE TWICE A WEEK 24 capsule 3  . THYROID PO Take by mouth. Take 1 capsule by mouth daily.     No facility-administered medications prior to visit.     Allergies  Allergen Reactions  . Aspirin Shortness Of Breath and Other (See Comments)    nasal congestion  . Nsaids Anaphylaxis  . Sulfa Antibiotics Rash    ROS As per HPI  PE: Blood pressure 109/69, pulse 65, temperature 98 F  (36.7 C), temperature source Temporal, resp. rate 16, height 5\' 3"  (1.6 m), weight (!) 313 lb 3.2 oz (142.1 kg), SpO2 94 %. Body mass index is 55.48 kg/m.  Exam chaperoned by Emi HolesBrittanae Staton, CMA. Gen: Alert, well appearing.  Patient is oriented to person, place, time, and situation. AFFECT: pleasant, lucid thought and speech. L spine flexion limited to 110 deg due to pain.  Extension, rotation, and lateral bending all ok. No L or sacral spine or paraspinous TTP. TTP R glut in middle and R regions but only with very deep palpation. LE strength 5/5 prox/dist bilat. Patellar and achilles DTRs 2-3+ bilat. Sitting SLR on L no pain. Sitting SLR on R worsens her pain in R glut pain.   LABS:  Lab Results  Component Value Date   TSH 5.16 (H) 01/17/2019     Chemistry      Component Value Date/Time   NA 144 01/17/2019 0910   K 4.6 01/17/2019 0910   CL 106 01/17/2019 0910   CO2 31 01/17/2019 0910   BUN 18 01/17/2019 0910   CREATININE 0.75 01/17/2019 0910      Component Value Date/Time   CALCIUM 9.9 01/17/2019 0910   ALKPHOS 65 09/19/2018 0117   AST 22 09/19/2018 0117   ALT 22 09/19/2018 0117   BILITOT 0.6 09/19/2018 0117     Lab Results  Component Value Date   WBC 8.3 09/19/2018   HGB 14.5 09/19/2018   HCT 45.9 09/19/2018   MCV 86.0 09/19/2018   PLT 344 09/19/2018   Lab Results  Component Value Date   CHOL 250 (H) 07/09/2018   HDL 40.70 07/09/2018   LDLCALC 76 10/05/2013   LDLDIRECT 68.0 07/09/2018   TRIG 350.0 (H) 07/09/2018   CHOLHDL 6 07/09/2018   Lab Results  Component Value Date   HGBA1C 5.3 09/19/2018   IMPRESSION AND PLAN:  1) Fibromyalgia, not ideal control.  She is in a bit of a flare currently. She describes having CTS bilat as well but we got side-tracked and did not discuss this today. We decided to leave her cymbalta and lyrica at current dosing and add a trial of tramadol 50-100mg  bid prn. Therapeutic expectations and side effect profile of  medication discussed today.  Patient's questions answered.  2) HTN: The current medical regimen is effective;  continue present plan and medications. BMET today.  3) Hypothyroidism: dose increased to 200 mcg qd about 3-4 mo ago. TSH recheck today.  4) Right gluteal muscle pain/strain: discussed heat, stretching, relative rest. I don't think this has anything to do with her fall on 9/8 b/c it just started a few days ago.  An After Visit Summary was printed and given  to the patient.  FOLLOW UP: Return in about 4 weeks (around 07/05/2019) for f/u fibromyalgia. Need to ask her again about her CTS sx's again at that time.  Signed:  Crissie Sickles, MD           06/07/2019

## 2019-06-12 ENCOUNTER — Other Ambulatory Visit: Payer: Self-pay | Admitting: Family Medicine

## 2019-06-19 ENCOUNTER — Other Ambulatory Visit: Payer: Self-pay | Admitting: Family Medicine

## 2019-06-27 ENCOUNTER — Encounter: Payer: Self-pay | Admitting: Family Medicine

## 2019-06-30 ENCOUNTER — Ambulatory Visit: Payer: Federal, State, Local not specified - PPO | Admitting: Family Medicine

## 2019-06-30 ENCOUNTER — Other Ambulatory Visit: Payer: Self-pay

## 2019-06-30 ENCOUNTER — Encounter: Payer: Self-pay | Admitting: Family Medicine

## 2019-06-30 DIAGNOSIS — G5603 Carpal tunnel syndrome, bilateral upper limbs: Secondary | ICD-10-CM | POA: Diagnosis not present

## 2019-06-30 DIAGNOSIS — M797 Fibromyalgia: Secondary | ICD-10-CM

## 2019-06-30 NOTE — Progress Notes (Signed)
Virtual Visit via Video Note  I connected with pt on 06/30/19 at  4:00 PM EDT by a video enabled telemedicine application and verified that I am speaking with the correct person using two identifiers.  Location patient: home Location provider:work or home office Persons participating in the virtual visit: patient, provider  I discussed the limitations of evaluation and management by telemedicine and the availability of in person appointments. The patient expressed understanding and agreed to proceed.  Telemedicine visit is a necessity given the COVID-19 restrictions in place at the current time.  HPI: 58 y/o WF being seen today for fibromyalgia f/u. A/P for this problem as of last visit 06/07/19: "Fibromyalgia, not ideal control.  She is in a bit of a flare currently. She describes having CTS bilat as well but we got side-tracked and did not discuss this today. We decided to leave her cymbalta and lyrica at current dosing and add a trial of tramadol 50-100mg  bid prn."  Interim hx: Still struggling. Tramadol somewhat helpful but feeling some mental and physical lethargy the following day. Taking 1/2 dose made her feel the same. Still feels like she is "flaring".  Pain sometimes generalized in soft tissues, other times more focal/random/roving soft tissue regions. Has been out of work 1 week due to pain and fibro fog and insomnia problems. Describing CTS again, both hands, intermittent tingling/numbness and some dexterity and strength in fingers, hands intermittently. Has never had dx of CTS in the past that she recalls, no hx of wearing wrist splints. She talked extensively today about her worries regarding her sx's and the emotional and cognitive toll it takes on her to just get out of bed and get through each day.   She is apprehensive about getting off cymbalta b/c it does help with her anx/dep and she is not sure about getting of lyrica at this time.  She wishes there was a cure.  Does  not have any desire to be put on "strong meds or narcotics", asks for something "natural" but can't be specific.   ROS: no fevers or joint swelling. No oral ulcers, no rashes.  Past Medical History:  Diagnosis Date  . Allergy   . Asthma   . Fibromyalgia 2017  . Herniated disc L4-L5  . High triglycerides    mild.  TLC.  Marland Kitchen. Hypertension   . Hypothyroidism   . IBS (irritable bowel syndrome)   . Internal hemorrhoids   . Neuropathic pain    Rx'd neurontin by her orthopedist  . Palpitations 09/2018   toprol xl started by cardiologist->no tachyarrhythmia on telemetry  . Sprain of posterior cruciate ligament of knee right knee  . Vitamin D deficiency 02/2017   high dose vit D started 02/23/17--vit D still low on recheck.  Needs 50K twice per week dosing indefinitely. Vit D level mid 40s Nov 2019.    Past Surgical History:  Procedure Laterality Date  . CARDIOVASCULAR STRESS TEST  09/19/2018   Low risk  . COLONOSCOPY W/ POLYPECTOMY  09/2012   Dr. Loreta AveMann: diverticulosis.  Recall 2024.  Marland Kitchen. TOTAL ABDOMINAL HYSTERECTOMY W/ BILATERAL SALPINGOOPHORECTOMY  approx 2006   Nonmalignant reasons  . TRANSTHORACIC ECHOCARDIOGRAM  09/19/2018   EF 60-65%, mild LVH, Grd I DD.    Family History  Problem Relation Age of Onset  . Arthritis Mother        osteo arithritis  . Obesity Mother   . Fibroids Mother        uterine  . Lymphoma Father   .  Sarcoidosis Father   . Pulmonary fibrosis Father   . Arthritis Maternal Grandmother        rheumatoid  . Cancer Maternal Grandmother        cervical  . Heart disease Maternal Grandfather        a-fib  . COPD Maternal Grandfather        emphysema  . Vision loss Paternal Grandmother   . Emphysema Paternal Grandfather   . Lung cancer Paternal Grandfather   . Celiac disease Sister   . Asthma Son   . Cancer Daughter        ovarian  . Obesity Sister     SOCIAL HX:  Social History   Socioeconomic History  . Marital status: Married    Spouse name:  Not on file  . Number of children: Not on file  . Years of education: Not on file  . Highest education level: Not on file  Occupational History  . Not on file  Social Needs  . Financial resource strain: Not on file  . Food insecurity    Worry: Not on file    Inability: Not on file  . Transportation needs    Medical: Not on file    Non-medical: Not on file  Tobacco Use  . Smoking status: Never Smoker  . Smokeless tobacco: Never Used  Substance and Sexual Activity  . Alcohol use: No    Comment: rarely  . Drug use: No  . Sexual activity: Yes    Partners: Male    Birth control/protection: None  Lifestyle  . Physical activity    Days per week: Not on file    Minutes per session: Not on file  . Stress: Not on file  Relationships  . Social Herbalist on phone: Not on file    Gets together: Not on file    Attends religious service: Not on file    Active member of club or organization: Not on file    Attends meetings of clubs or organizations: Not on file    Relationship status: Not on file  Other Topics Concern  . Not on file  Social History Narrative   Married 26 yrs, 3 children (1 daught, 2 sons).   [place]ig from [place].   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   No tobacco, very rare alcohol, no drugs.          Current Outpatient Medications:  .  calcium carbonate (TUMS - DOSED IN MG ELEMENTAL CALCIUM) 500 MG chewable tablet, Chew 1 tablet by mouth as needed for indigestion or heartburn., Disp: , Rfl:  .  Cyanocobalamin (VITAMIN B-12) 5000 MCG SUBL, Place under the tongue., Disp: , Rfl:  .  DULoxetine (CYMBALTA) 60 MG capsule, TAKE 1 CAPSULE BY MOUTH EVERY DAY, Disp: 30 capsule, Rfl: 0 .  hydrochlorothiazide (HYDRODIURIL) 25 MG tablet, TAKE 1 TABLET BY MOUTH EVERY DAY, Disp: 30 tablet, Rfl: 0 .  levothyroxine (SYNTHROID) 200 MCG tablet, Take 1 tablet (200 mcg total) by mouth daily before breakfast., Disp: 90 tablet,  Rfl: 1 .  LYRICA 150 MG capsule, TAKE 1 CAPSULE BY MOUTH TWICE A DAY, Disp: 180 capsule, Rfl: 1 .  metoprolol succinate (TOPROL-XL) 25 MG 24 hr tablet, Take 0.5 tablets (12.5 mg total) by mouth daily., Disp: 30 tablet, Rfl: 6 .  Vitamin D, Ergocalciferol, (DRISDOL) 1.25 MG (50000 UT) CAPS capsule, TAKE ONE CAPSULE TWICE A WEEK, Disp: 24 capsule, Rfl: 3 .  traMADol (ULTRAM) 50 MG tablet, 1-2 tabs po bid prn (Patient not taking: Reported on 06/30/2019), Disp: 40 tablet, Rfl: 0  EXAM:  VITALS per patient if applicable: There were no vitals taken for this visit.   GENERAL: alert, oriented, appears well and in no acute distress  HEENT: atraumatic, conjunttiva clear, no obvious abnormalities on inspection of external nose and ears  NECK: normal movements of the head and neck  LUNGS: on inspection no signs of respiratory distress, breathing rate appears normal, no obvious gross SOB, gasping or wheezing  CV: no obvious cyanosis  MS: moves all visible extremities without noticeable abnormality  PSYCH/NEURO: pleasant and cooperative, no obvious depression or anxiety, speech and thought processing grossly intact  LABS: none today    Chemistry      Component Value Date/Time   NA 141 06/07/2019 0934   K 3.8 06/07/2019 0934   CL 102 06/07/2019 0934   CO2 28 06/07/2019 0934   BUN 17 06/07/2019 0934   CREATININE 0.81 06/07/2019 0934   CREATININE 0.75 01/17/2019 0910      Component Value Date/Time   CALCIUM 9.5 06/07/2019 0934   ALKPHOS 65 09/19/2018 0117   AST 22 09/19/2018 0117   ALT 22 09/19/2018 0117   BILITOT 0.6 09/19/2018 0117     Lab Results  Component Value Date   HGBA1C 5.3 09/19/2018    ASSESSMENT AND PLAN:  Discussed the following assessment and plan:  1) Fibromyalgia syndrome, chronic fatigue. She is frustrated.  We'll stop any further trial of tramadol. Continue with lyrica and cymbalta as-is. Will see if we can get her into [facility]-referral  ordered today.  2) CTS: her description of sx's are c/w this. She is not too interested in this or it's treatment.  I think she is convinced it is some sort of "damage" to her tissues that fibromyalgia has caused.   I discussed the assessment and treatment plan with the patient. The patient was provided an opportunity to ask questions and all were answered. The patient agreed with the plan and demonstrated an understanding of the instructions.   The patient was advised to call back or seek an in-person evaluation if the symptoms worsen or if the condition fails to improve as anticipated.  Spent 30 min with pt today, with >50% of this time spent in counseling and care coordination regarding the above problems.  F/u: 1 mo  Signed:  Santiago Bumpers, MD           06/30/2019

## 2019-07-07 ENCOUNTER — Other Ambulatory Visit: Payer: Self-pay | Admitting: Family Medicine

## 2019-07-11 ENCOUNTER — Other Ambulatory Visit: Payer: Self-pay | Admitting: Family Medicine

## 2019-07-16 ENCOUNTER — Other Ambulatory Visit: Payer: Self-pay | Admitting: Family Medicine

## 2019-07-21 ENCOUNTER — Other Ambulatory Visit: Payer: Self-pay | Admitting: Family Medicine

## 2019-07-25 ENCOUNTER — Ambulatory Visit (INDEPENDENT_AMBULATORY_CARE_PROVIDER_SITE_OTHER): Payer: Federal, State, Local not specified - PPO | Admitting: Family Medicine

## 2019-07-25 ENCOUNTER — Other Ambulatory Visit: Payer: Self-pay

## 2019-07-25 ENCOUNTER — Encounter: Payer: Self-pay | Admitting: Family Medicine

## 2019-07-25 VITALS — HR 80 | Temp 98.8°F | Wt 311.0 lb

## 2019-07-25 DIAGNOSIS — R0982 Postnasal drip: Secondary | ICD-10-CM | POA: Diagnosis not present

## 2019-07-25 DIAGNOSIS — R0981 Nasal congestion: Secondary | ICD-10-CM | POA: Diagnosis not present

## 2019-07-25 DIAGNOSIS — R5383 Other fatigue: Secondary | ICD-10-CM

## 2019-07-25 DIAGNOSIS — R05 Cough: Secondary | ICD-10-CM | POA: Diagnosis not present

## 2019-07-25 DIAGNOSIS — R059 Cough, unspecified: Secondary | ICD-10-CM

## 2019-07-25 NOTE — Progress Notes (Signed)
Virtual Visit via Video Note  I connected with pt on 07/25/19 at  4:00 PM EST by a video enabled telemedicine application and verified that I am speaking with the correct person using two identifiers.  Location patient: home Location provider:work or home office Persons participating in the virtual visit: patient, provider  I discussed the limitations of evaluation and management by telemedicine and the availability of in person appointments. The patient expressed understanding and agreed to proceed.  Telemedicine visit is a necessity given the COVID-19 restrictions in place at the current time.  HPI: Brenda Schroeder being seen today for fatigue. Feeling worn down and tired and more easily winded the last week or so. No fevers.  Has had some nasal mucous lately, mostly clear or yellow. Had some question blood tinged sputum x 1 middle of last night, says this is the only time she has coughed during this time.  She is very worried anytime anything "out of the normal" happens with her b/c her father has hx of nonhogdkins lymphoma and her aunt has cancer as well.  ROS: no CP, no wheezing, no dizziness, no HAs, no rashes, no melena/hematochezia.  No polyuria or polydipsia.     Past Medical History:  Diagnosis Date  . Allergy   . Asthma   . Fibromyalgia 2017  . Herniated disc L4-L5  . High triglycerides    mild.  TLC.  Marland Kitchen. Hypertension   . Hypothyroidism   . IBS (irritable bowel syndrome)   . Internal hemorrhoids   . Neuropathic pain    Rx'd neurontin by her orthopedist  . Palpitations 09/2018   toprol xl started by cardiologist->no tachyarrhythmia on telemetry  . Sprain of posterior cruciate ligament of knee right knee  . Vitamin D deficiency 02/2017   high dose vit D started 02/23/17--vit D still low on recheck.  Needs 50K twice per week dosing indefinitely. Vit D level mid 40s Nov 2019.    Past Surgical History:  Procedure Laterality Date  . CARDIOVASCULAR STRESS TEST  09/19/2018    Low risk  . COLONOSCOPY W/ POLYPECTOMY  09/2012   Dr. Loreta AveMann: diverticulosis.  Recall 2024.  Marland Kitchen. TOTAL ABDOMINAL HYSTERECTOMY W/ BILATERAL SALPINGOOPHORECTOMY  approx 2006   Nonmalignant reasons  . TRANSTHORACIC ECHOCARDIOGRAM  09/19/2018   EF 60-65%, mild LVH, Grd I DD.    Family History  Problem Relation Age of Onset  . Arthritis Mother        osteo arithritis  . Obesity Mother   . Fibroids Mother        uterine  . Lymphoma Father   . Sarcoidosis Father   . Pulmonary fibrosis Father   . Arthritis Maternal Grandmother        rheumatoid  . Cancer Maternal Grandmother        cervical  . Heart disease Maternal Grandfather        a-fib  . COPD Maternal Grandfather        emphysema  . Vision loss Paternal Grandmother   . Emphysema Paternal Grandfather   . Lung cancer Paternal Grandfather   . Celiac disease Sister   . Asthma Son   . Cancer Daughter        ovarian  . Obesity Sister     SOCIAL HX:  Social History   Socioeconomic History  . Marital status: Married    Spouse name: Not on file  . Number of children: Not on file  . Years of education: Not on file  .  Highest education level: Not on file  Occupational History  . Not on file  Social Needs  . Financial resource strain: Not on file  . Food insecurity    Worry: Not on file    Inability: Not on file  . Transportation needs    Medical: Not on file    Non-medical: Not on file  Tobacco Use  . Smoking status: Never Smoker  . Smokeless tobacco: Never Used  Substance and Sexual Activity  . Alcohol use: No    Comment: rarely  . Drug use: No  . Sexual activity: Yes    Partners: Male    Birth control/protection: None  Lifestyle  . Physical activity    Days per week: Not on file    Minutes per session: Not on file  . Stress: Not on file  Relationships  . Social Musician on phone: Not on file    Gets together: Not on file    Attends religious service: Not on file    Active member of club or  organization: Not on file    Attends meetings of clubs or organizations: Not on file    Relationship status: Not on file  Other Topics Concern  . Not on file  Social History Narrative   Married 26 yrs, 3 children (1 daught, 2 sons).   [place]ig from [place].   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   No tobacco, very rare alcohol, no drugs.          Current Outpatient Medications:  .  calcium carbonate (TUMS - DOSED IN MG ELEMENTAL CALCIUM) 500 MG chewable tablet, Chew 1 tablet by mouth as needed for indigestion or heartburn., Disp: , Rfl:  .  Cyanocobalamin (VITAMIN B-12) 5000 MCG SUBL, Place under the tongue., Disp: , Rfl:  .  DULoxetine (CYMBALTA) 60 MG capsule, TAKE 1 CAPSULE BY MOUTH EVERY DAY, Disp: 90 capsule, Rfl: 1 .  hydrochlorothiazide (HYDRODIURIL) 25 MG tablet, TAKE 1 TABLET BY MOUTH EVERY DAY, Disp: 30 tablet, Rfl: 0 .  levothyroxine (SYNTHROID) 200 MCG tablet, TAKE 1 TABLET (200 MCG TOTAL) BY MOUTH DAILY BEFORE BREAKFAST., Disp: 90 tablet, Rfl: 1 .  LYRICA 150 MG capsule, TAKE 1 CAPSULE BY MOUTH TWICE A DAY, Disp: 180 capsule, Rfl: 1 .  metoprolol succinate (TOPROL-XL) 25 MG 24 hr tablet, Take 0.5 tablets (12.5 mg total) by mouth daily., Disp: 30 tablet, Rfl: 6 .  traMADol (ULTRAM) 50 MG tablet, 1-2 tabs po bid prn, Disp: 40 tablet, Rfl: 0 .  Vitamin D, Ergocalciferol, (DRISDOL) 1.25 MG (50000 UT) CAPS capsule, TAKE ONE CAPSULE TWICE A WEEK, Disp: 24 capsule, Rfl: 3  EXAM:  VITALS per patient if applicable: Pulse 80   Temp 98.8 F (37.1 C) (Temporal)   Wt (!) 311 lb (141.1 kg)   SpO2 98%   BMI 55.09 kg/m    GENERAL: alert, oriented, appears well and in no acute distress  HEENT: atraumatic, conjunttiva clear, no obvious abnormalities on inspection of external nose and ears  NECK: normal movements of the head and neck  LUNGS: on inspection no signs of respiratory distress, breathing rate appears normal, no obvious gross  SOB, gasping or wheezing  CV: no obvious cyanosis  MS: moves all visible extremities without noticeable abnormality  PSYCH/NEURO: pleasant and cooperative, no obvious depression or anxiety, speech and thought processing grossly intact  LABS: none    Chemistry      Component Value  Date/Time   NA 141 06/07/2019 0934   K 3.8 06/07/2019 0934   CL 102 06/07/2019 0934   CO2 28 06/07/2019 0934   BUN 17 06/07/2019 0934   CREATININE 0.81 06/07/2019 0934   CREATININE 0.75 01/17/2019 0910      Component Value Date/Time   CALCIUM 9.5 06/07/2019 0934   ALKPHOS 65 09/19/2018 0117   AST 22 09/19/2018 0117   ALT 22 09/19/2018 0117   BILITOT 0.6 09/19/2018 0117     Lab Results  Component Value Date   WBC 8.3 09/19/2018   HGB 14.5 09/19/2018   HCT 45.9 09/19/2018   MCV 86.0 09/19/2018   PLT 344 09/19/2018   Lab Results  Component Value Date   TSH 1.34 06/07/2019   Lab Results  Component Value Date   HGBA1C 5.3 09/19/2018    ASSESSMENT AND PLAN:  Discussed the following assessment and plan:  Fatigue, nonspecific. Her only other sx's are a bit of DOE and nasal mucous with PND. The one time coughing episode produced some mucous that she showed me today (on a tissue). It looked dried and dark brown, possibly some black, but no red. She has fibromyalgia and admits that this is almost always giving her symptoms and it does wax and wane in ways similar to what she is feeling lately.   She seems somewhat reluctant to be reassured today but I told her I think it is premature to image lungs or do blood work based on what she has told me so far. No new meds rx'd today. Signs/symptoms to call or return for were reviewed and pt expressed understanding.   I discussed the assessment and treatment plan with the patient. The patient was provided an opportunity to ask questions and all were answered. The patient agreed with the plan and demonstrated an understanding of the instructions.    The patient was advised to call back or seek an in-person evaluation if the symptoms worsen or if the condition fails to improve as anticipated.  F/u: if not improving.  Signed:  Crissie Sickles, MD           07/25/2019

## 2019-08-02 ENCOUNTER — Ambulatory Visit (INDEPENDENT_AMBULATORY_CARE_PROVIDER_SITE_OTHER): Payer: Federal, State, Local not specified - PPO | Admitting: Family Medicine

## 2019-08-02 ENCOUNTER — Other Ambulatory Visit: Payer: Self-pay

## 2019-08-02 ENCOUNTER — Encounter: Payer: Self-pay | Admitting: Family Medicine

## 2019-08-02 VITALS — BP 162/97 | HR 82

## 2019-08-02 DIAGNOSIS — I10 Essential (primary) hypertension: Secondary | ICD-10-CM | POA: Diagnosis not present

## 2019-08-02 DIAGNOSIS — R0600 Dyspnea, unspecified: Secondary | ICD-10-CM

## 2019-08-02 DIAGNOSIS — M797 Fibromyalgia: Secondary | ICD-10-CM | POA: Diagnosis not present

## 2019-08-02 DIAGNOSIS — G9332 Myalgic encephalomyelitis/chronic fatigue syndrome: Secondary | ICD-10-CM

## 2019-08-02 DIAGNOSIS — R0609 Other forms of dyspnea: Secondary | ICD-10-CM

## 2019-08-02 DIAGNOSIS — R5382 Chronic fatigue, unspecified: Secondary | ICD-10-CM | POA: Diagnosis not present

## 2019-08-02 MED ORDER — METOPROLOL SUCCINATE ER 25 MG PO TB24: 25.0000 mg | ORAL_TABLET | Freq: Every day | ORAL | 6 refills | Status: DC

## 2019-08-02 NOTE — Progress Notes (Signed)
Virtual Visit via Video Note  I connected with pt on 08/02/19 at  8:00 AM EST by a video enabled telemedicine application and verified that I am speaking with the correct person using two identifiers.  Location patient: home Location provider:work or home office Persons participating in the virtual visit: patient, provider  I discussed the limitations of evaluation and management by telemedicine and the availability of in person appointments. The patient expressed understanding and agreed to proceed.  Telemedicine visit is a necessity given the COVID-19 restrictions in place at the current time.  HPI: 58 y/o WF being seen today for f/u fibromyalgia syndrome and chronic fatigue syndrome. She also has HTN and hypothyroidism.  Still c/o DOE: onset a few months ago sounds like. Occurs with walking across parking lot or doing something mildly strenuous. No CP or pressure, no jaw or arm pain, no nausea or diaphoresis. No wheezing except on one occasion.  No coughing.  No palpitations.  No dizziness. Recovers in < 1 min. Lived with a heavy smoker many years but she has never smoked. No fevers.  HTN: BP is up today to 162/97, HR 82 upper arm cuff.  Down to 131/86 w/wrist cuff. Was up at the time of last visit as well. She does not check bp much b/c she has been used to it being well controlled.  Fibro/fatigue: nothing much has changed.  She still struggles with sx's of roving myalgias and arthralgias and these at times become more generalized-- round the clock but wants to continue on current regimen. No joint swelling and no focal weakness.  ROS: No HAs, no rashes, no melena/hematochezia.  No polyuria or polydipsia.    Past Medical History:  Diagnosis Date  . Allergy   . Asthma   . Fibromyalgia 2017  . Herniated disc L4-L5  . High triglycerides    mild.  TLC.  Marland Kitchen Hypertension   . Hypothyroidism   . IBS (irritable bowel syndrome)   . Internal hemorrhoids   . Neuropathic pain     Rx'd neurontin by her orthopedist  . Palpitations 09/2018   toprol xl started by cardiologist->no tachyarrhythmia on telemetry  . Sprain of posterior cruciate ligament of knee right knee  . Vitamin D deficiency 02/2017   high dose vit D started 02/23/17--vit D still low on recheck.  Needs 50K twice per week dosing indefinitely. Vit D level mid 40s Nov 2019.    Past Surgical History:  Procedure Laterality Date  . CARDIOVASCULAR STRESS TEST  09/19/2018   Low risk  . COLONOSCOPY W/ POLYPECTOMY  09/2012   Dr. Loreta Ave: diverticulosis.  Recall 2024.  Marland Kitchen TOTAL ABDOMINAL HYSTERECTOMY W/ BILATERAL SALPINGOOPHORECTOMY  approx 2006   Nonmalignant reasons  . TRANSTHORACIC ECHOCARDIOGRAM  09/19/2018   EF 60-65%, mild LVH, Grd I DD.    Family History  Problem Relation Age of Onset  . Arthritis Mother        osteo arithritis  . Obesity Mother   . Fibroids Mother        uterine  . Lymphoma Father   . Sarcoidosis Father   . Pulmonary fibrosis Father   . Arthritis Maternal Grandmother        rheumatoid  . Cancer Maternal Grandmother        cervical  . Heart disease Maternal Grandfather        a-fib  . COPD Maternal Grandfather        emphysema  . Vision loss Paternal Grandmother   . Emphysema Paternal  Grandfather   . Lung cancer Paternal Grandfather   . Celiac disease Sister   . Asthma Son   . Cancer Daughter        ovarian  . Obesity Sister     SOCIAL HX:  Social History   Socioeconomic History  . Marital status: Married    Spouse name: Not on file  . Number of children: Not on file  . Years of education: Not on file  . Highest education level: Not on file  Occupational History  . Not on file  Social Needs  . Financial resource strain: Not on file  . Food insecurity    Worry: Not on file    Inability: Not on file  . Transportation needs    Medical: Not on file    Non-medical: Not on file  Tobacco Use  . Smoking status: Never Smoker  . Smokeless tobacco: Never Used   Substance and Sexual Activity  . Alcohol use: No    Comment: rarely  . Drug use: No  . Sexual activity: Yes    Partners: Male    Birth control/protection: None  Lifestyle  . Physical activity    Days per week: Not on file    Minutes per session: Not on file  . Stress: Not on file  Relationships  . Social Musicianconnections    Talks on phone: Not on file    Gets together: Not on file    Attends religious service: Not on file    Active member of club or organization: Not on file    Attends meetings of clubs or organizations: Not on file    Relationship status: Not on file  Other Topics Concern  . Not on file  Social History Narrative   Married 26 yrs, 3 children (1 daught, 2 sons).   [place]   Occupation: works for [employer] as of 06/2017.   No tobacco, very rare alcohol, no drugs.          Current Outpatient Medications:  .  calcium carbonate (TUMS - DOSED IN MG ELEMENTAL CALCIUM) 500 MG chewable tablet, Chew 1 tablet by mouth as needed for indigestion or heartburn., Disp: , Rfl:  .  Cyanocobalamin (VITAMIN B-12) 5000 MCG SUBL, Place under the tongue., Disp: , Rfl:  .  DULoxetine (CYMBALTA) 60 MG capsule, TAKE 1 CAPSULE BY MOUTH EVERY DAY, Disp: 90 capsule, Rfl: 1 .  hydrochlorothiazide (HYDRODIURIL) 25 MG tablet, TAKE 1 TABLET BY MOUTH EVERY DAY, Disp: 30 tablet, Rfl: 0 .  levothyroxine (SYNTHROID) 200 MCG tablet, TAKE 1 TABLET (200 MCG TOTAL) BY MOUTH DAILY BEFORE BREAKFAST., Disp: 90 tablet, Rfl: 1 .  LYRICA 150 MG capsule, TAKE 1 CAPSULE BY MOUTH TWICE A DAY, Disp: 180 capsule, Rfl: 1 .  metoprolol succinate (TOPROL-XL) 25 MG 24 hr tablet, Take 0.5 tablets (12.5 mg total) by mouth daily., Disp: 30 tablet, Rfl: 6 .  traMADol (ULTRAM) 50 MG tablet, 1-2 tabs po bid prn, Disp: 40 tablet, Rfl: 0 .  Vitamin D, Ergocalciferol, (DRISDOL) 1.25 MG (50000 UT) CAPS capsule, TAKE ONE CAPSULE TWICE A WEEK, Disp: 24 capsule, Rfl:  3  EXAM:  VITALS per patient if applicable:  BP (!) 162/97 (BP Location: Left Arm, Patient Position: Sitting, Cuff Size: Large)   Pulse 82    GENERAL: alert, oriented, appears well and in no acute distress  HEENT: atraumatic, conjunttiva clear, no obvious abnormalities on inspection of external nose and ears  NECK:  normal movements of the head and neck  LUNGS: on inspection no signs of respiratory distress, breathing rate appears normal, no obvious gross SOB, gasping or wheezing  CV: no obvious cyanosis  MS: moves all visible extremities without noticeable abnormality  PSYCH/NEURO: pleasant and cooperative, no obvious depression or anxiety, speech and thought processing grossly intact  LABS: none today    Chemistry      Component Value Date/Time   NA 141 06/07/2019 0934   K 3.8 06/07/2019 0934   CL 102 06/07/2019 0934   CO2 28 06/07/2019 0934   BUN 17 06/07/2019 0934   CREATININE 0.81 06/07/2019 0934   CREATININE 0.75 01/17/2019 0910      Component Value Date/Time   CALCIUM 9.5 06/07/2019 0934   ALKPHOS 65 09/19/2018 0117   AST 22 09/19/2018 0117   ALT 22 09/19/2018 0117   BILITOT 0.6 09/19/2018 0117     Lab Results  Component Value Date   WBC 8.3 09/19/2018   HGB 14.5 09/19/2018   HCT 45.9 09/19/2018   MCV 86.0 09/19/2018   PLT 344 09/19/2018   Lab Results  Component Value Date   TSH 1.34 06/07/2019   Lab Results  Component Value Date   CHOL 250 (H) 07/09/2018   HDL 40.70 07/09/2018   LDLCALC 76 10/05/2013   LDLDIRECT 68.0 07/09/2018   TRIG 350.0 (H) 07/09/2018   CHOLHDL 6 07/09/2018   Lab Results  Component Value Date   ESRSEDRATE 7 05/16/2016   ASSESSMENT AND PLAN:  Discussed the following assessment and plan:  1) Fibromyalgia: stable, not very well controlled, though. She doesn't want to change anything at this time.  2) HTN; not well controlled. Will increase metoprolol to WHOLE 25mg  tab qd as a start. She'll check bp and Hr daily for  1 week and send these numbers to me via MyChart and I'll make adjustment or have her make another appt as appropriate.  3) DOE: ECHO and stress test normal 09/2018 in the context of palpitations. Her DD/hypertensive heart dz could be worse from uncontrolled HTN, so possibly sx's could get better with better bp control. However, will also do CXR and PFTs. If testing normal and pt not feeling better with better bp control, will get CBC and CMET and if normal will refer to pulmonology.   -we discussed possible serious and likely etiologies, options for evaluation and workup, limitations of telemedicine visit vs in person visit, treatment, treatment risks and precautions. Pt prefers to treat via telemedicine empirically rather then risking or undertaking an in person visit at this moment. Patient agrees to seek prompt in person care if worsening, new symptoms arise, or if is not improving with treatment.   I discussed the assessment and treatment plan with the patient. The patient was provided an opportunity to ask questions and all were answered. The patient agreed with the plan and demonstrated an understanding of the instructions.   The patient was advised to call back or seek an in-person evaluation if the symptoms worsen or if the condition fails to improve as anticipated.  F/u: to be determined based on w/u results  Signed:  Crissie Sickles, MD           08/02/2019

## 2019-08-11 ENCOUNTER — Telehealth: Payer: Self-pay | Admitting: Family Medicine

## 2019-08-11 NOTE — Telephone Encounter (Signed)
LM for patient to call back. PFT has been scheduled at [facility] main campus Entrance A off of [address] [phone]) on 08/26/19. Patient needs to arrive at 2:45 for a 3:00PM appt. No caffeine, cigarettes, inhalers 4 hours prior.  Patient has to have COVID test done on 08/23/19 no exceptions. If patient goes anywhere other than  [place] or [facility] for [place] test she will have to provide printed paperwork showing a negative result.

## 2019-08-16 ENCOUNTER — Other Ambulatory Visit: Payer: Self-pay | Admitting: Family Medicine

## 2019-08-24 NOTE — Telephone Encounter (Signed)
Left a message for the patient to call [phone] to get information about moving her appointment. Patient is required to have a negative COVID test 3 days prior to the test.

## 2019-08-26 ENCOUNTER — Encounter (HOSPITAL_COMMUNITY): Payer: Federal, State, Local not specified - PPO

## 2019-08-26 ENCOUNTER — Other Ambulatory Visit (HOSPITAL_COMMUNITY)
Admission: RE | Admit: 2019-08-26 | Discharge: 2019-08-26 | Disposition: A | Discharge status: Home / Self Care | Payer: Federal, State, Local not specified - PPO | Source: Ambulatory Visit | Attending: Family Medicine | Admitting: Family Medicine

## 2019-08-26 DIAGNOSIS — Z20828 Contact with and (suspected) exposure to other viral communicable diseases: Secondary | ICD-10-CM | POA: Insufficient documentation

## 2019-08-26 DIAGNOSIS — Z01812 Encounter for preprocedural laboratory examination: Secondary | ICD-10-CM | POA: Insufficient documentation

## 2019-08-26 LAB — SARS CORONAVIRUS 2 (TAT 6-24 HRS): SARS Coronavirus 2: NEGATIVE

## 2019-08-29 ENCOUNTER — Other Ambulatory Visit: Payer: Self-pay

## 2019-08-29 ENCOUNTER — Ambulatory Visit (HOSPITAL_COMMUNITY)
Admission: RE | Admit: 2019-08-29 | Discharge: 2019-08-29 | Disposition: A | Discharge status: Home / Self Care | Payer: Federal, State, Local not specified - PPO | Source: Ambulatory Visit | Attending: Family Medicine | Admitting: Family Medicine

## 2019-08-29 DIAGNOSIS — R0609 Other forms of dyspnea: Secondary | ICD-10-CM

## 2019-08-29 DIAGNOSIS — R0600 Dyspnea, unspecified: Secondary | ICD-10-CM | POA: Diagnosis not present

## 2019-08-29 LAB — PULMONARY FUNCTION TEST
DL/VA % pred: 108 %
DL/VA: 4.6 ml/min/mmHg/L
DLCO unc % pred: 91 %
DLCO unc: 18.38 ml/min/mmHg
FEF 25-75 Post: 3.06 L/sec
FEF 25-75 Pre: 2.55 L/sec
FEF2575-%Change-Post: 19 %
FEF2575-%Pred-Post: 124 %
FEF2575-%Pred-Pre: 104 %
FEV1-%Change-Post: 5 %
FEV1-%Pred-Post: 88 %
FEV1-%Pred-Pre: 84 %
FEV1-Post: 2.3 L
FEV1-Pre: 2.17 L
FEV1FVC-%Change-Post: -1 %
FEV1FVC-%Pred-Pre: 108 %
FEV6-%Change-Post: 7 %
FEV6-%Pred-Post: 84 %
FEV6-%Pred-Pre: 79 %
FEV6-Post: 2.73 L
FEV6-Pre: 2.55 L
FEV6FVC-%Pred-Post: 103 %
FEV6FVC-%Pred-Pre: 103 %
FVC-%Change-Post: 7 %
FVC-%Pred-Post: 82 %
FVC-%Pred-Pre: 76 %
FVC-Post: 2.73 L
FVC-Pre: 2.55 L
Post FEV1/FVC ratio: 84 %
Post FEV6/FVC ratio: 100 %
Pre FEV1/FVC ratio: 85 %
Pre FEV6/FVC Ratio: 100 %
RV % pred: 105 %
RV: 2 L
TLC % pred: 98 %
TLC: 4.91 L

## 2019-08-29 MED ORDER — ALBUTEROL SULFATE (2.5 MG/3ML) 0.083% IN NEBU
2.5000 mg | INHALATION_SOLUTION | Freq: Once | RESPIRATORY_TRACT | Status: AC
  Administered 2019-08-29: 16:00:00 2.5 mg via RESPIRATORY_TRACT

## 2019-08-29 NOTE — Telephone Encounter (Signed)
Patient is scheduled 08/29/19

## 2019-08-30 ENCOUNTER — Encounter: Payer: Self-pay | Admitting: Family Medicine

## 2019-08-30 ENCOUNTER — Other Ambulatory Visit: Payer: Self-pay | Admitting: Family Medicine

## 2019-08-30 DIAGNOSIS — R0609 Other forms of dyspnea: Secondary | ICD-10-CM

## 2019-08-30 DIAGNOSIS — R0600 Dyspnea, unspecified: Secondary | ICD-10-CM

## 2019-08-31 ENCOUNTER — Other Ambulatory Visit: Payer: Self-pay

## 2019-08-31 DIAGNOSIS — R0609 Other forms of dyspnea: Secondary | ICD-10-CM

## 2019-08-31 DIAGNOSIS — R0600 Dyspnea, unspecified: Secondary | ICD-10-CM

## 2019-09-02 ENCOUNTER — Other Ambulatory Visit: Payer: Self-pay | Admitting: Family Medicine

## 2019-09-05 ENCOUNTER — Ambulatory Visit: Payer: Federal, State, Local not specified - PPO

## 2019-09-06 ENCOUNTER — Ambulatory Visit
Admission: RE | Admit: 2019-09-06 | Discharge: 2019-09-06 | Disposition: A | Discharge status: Home / Self Care | Payer: Federal, State, Local not specified - PPO | Source: Ambulatory Visit | Attending: Family Medicine | Admitting: Family Medicine

## 2019-09-06 ENCOUNTER — Other Ambulatory Visit (INDEPENDENT_AMBULATORY_CARE_PROVIDER_SITE_OTHER): Payer: Federal, State, Local not specified - PPO

## 2019-09-06 ENCOUNTER — Other Ambulatory Visit: Payer: Self-pay

## 2019-09-06 DIAGNOSIS — R0600 Dyspnea, unspecified: Secondary | ICD-10-CM

## 2019-09-06 DIAGNOSIS — R0609 Other forms of dyspnea: Secondary | ICD-10-CM

## 2019-09-06 DIAGNOSIS — R0602 Shortness of breath: Secondary | ICD-10-CM | POA: Diagnosis not present

## 2019-09-06 LAB — CBC WITH DIFFERENTIAL/PLATELET
Basophils Absolute: 0 10*3/uL (ref 0.0–0.1)
Basophils Relative: 0.6 % (ref 0.0–3.0)
Eosinophils Absolute: 0.2 10*3/uL (ref 0.0–0.7)
Eosinophils Relative: 4.3 % (ref 0.0–5.0)
HCT: 41.9 % (ref 36.0–46.0)
Hemoglobin: 13.7 g/dL (ref 12.0–15.0)
Lymphocytes Relative: 35.5 % (ref 12.0–46.0)
Lymphs Abs: 2 10*3/uL (ref 0.7–4.0)
MCHC: 32.7 g/dL (ref 30.0–36.0)
MCV: 83.5 fl (ref 78.0–100.0)
Monocytes Absolute: 0.2 10*3/uL (ref 0.1–1.0)
Monocytes Relative: 4.2 % (ref 3.0–12.0)
Neutro Abs: 3.2 10*3/uL (ref 1.4–7.7)
Neutrophils Relative %: 55.4 % (ref 43.0–77.0)
Platelets: 338 10*3/uL (ref 150.0–400.0)
RBC: 5.02 Mil/uL (ref 3.87–5.11)
RDW: 14.7 % (ref 11.5–15.5)
WBC: 5.8 10*3/uL (ref 4.0–10.5)

## 2019-09-08 ENCOUNTER — Other Ambulatory Visit: Payer: Federal, State, Local not specified - PPO

## 2019-11-06 ENCOUNTER — Other Ambulatory Visit: Payer: Self-pay | Admitting: Family Medicine

## 2019-11-07 NOTE — Telephone Encounter (Signed)
Requesting: Lyrica Last Visit:08/02/19 Next Visit:n/a Last Refill:04/25/19(180,1)  Please Advise. Medication pending

## 2019-12-27 ENCOUNTER — Other Ambulatory Visit: Payer: Self-pay | Admitting: Family Medicine

## 2020-01-04 ENCOUNTER — Encounter: Payer: Self-pay | Admitting: Family Medicine

## 2020-01-04 NOTE — Telephone Encounter (Signed)
Virtual is fine

## 2020-01-04 NOTE — Telephone Encounter (Signed)
Patient c/o flare up affecting being able to work. She was asking for med recommendations. Advised this would be best discussed during an o/v. Please let me know if this can be done as virtual or if in person needed, thanks.

## 2020-01-05 ENCOUNTER — Telehealth: Payer: Federal, State, Local not specified - PPO | Admitting: Family Medicine

## 2020-01-05 ENCOUNTER — Encounter: Payer: Self-pay | Admitting: Family Medicine

## 2020-01-05 ENCOUNTER — Other Ambulatory Visit: Payer: Self-pay

## 2020-01-05 VITALS — BP 118/79 | HR 73 | Temp 98.2°F | Resp 16 | Ht 63.0 in | Wt 321.0 lb

## 2020-01-05 DIAGNOSIS — M797 Fibromyalgia: Secondary | ICD-10-CM

## 2020-01-05 DIAGNOSIS — R52 Pain, unspecified: Secondary | ICD-10-CM

## 2020-01-05 MED ORDER — PREDNISONE 20 MG PO TABS: ORAL_TABLET | ORAL | 0 refills | Status: DC

## 2020-01-05 MED ORDER — PREGABALIN 150 MG PO CAPS: 150.0000 mg | ORAL_CAPSULE | Freq: Two times a day (BID) | ORAL | 1 refills | Status: DC

## 2020-01-05 NOTE — Progress Notes (Signed)
OFFICE VISIT  01/05/2020   CC:  Chief Complaint  Patient presents with  . Fibromyalgia flare   HPI:    Patient is a 59 y.o. Caucasian female with HTN, hypothyroidism, and fibromyalgia syndrome who presents for acute widespread body pain, suspected fibromyalgia flare. Onset of signif worsening about 1 wk ago, MUCH worse the last 2-3 days. Hurting everywhere, the worst area is R side over R iliac crest.  Feeling pressure/tightening across lower back.  Minimal discomfort sitting, can't stand long w/out lots of pain. Pain woke her up from sleep a couple nights ago. Took tylenol 1000 mg bid. Took 1/2-1 whole tab tramadol last two days.  This does cause some drowsiness and impaired ability to work. Hot showers.  This seems to be her worst flare but very similar to one about 6-7 mo ago. Nothing precipitated this that she can identify. No fevers, rashes, or joint swelling. Some bilat LL swelling that is not unusual for her. Has been taking only one 150mg  lyrica qhs instead of bid b/c of possible intermittent brief confusion/poor mentation.  This returned to baseline after decreasing dose to one qd.  Taking cymbalta qd as rx'd.   PMP AWARE reviewed today: most recent rx for lyrica was filled 08/29/19, # 180, rx by me. Tramadol 50mg  most recently filled 06/06/20, #28, rx by me. No red flags.   Past Medical History:  Diagnosis Date  . Allergy   . Asthma   . Fibromyalgia 2017  . Herniated disc L4-L5  . High triglycerides    mild.  TLC.  06/08/20 Hypertension   . Hypothyroidism   . IBS (irritable bowel syndrome)   . Internal hemorrhoids   . Neuropathic pain    Rx'd neurontin by her orthopedist  . Palpitations 09/2018   toprol xl started by cardiologist->no tachyarrhythmia on telemetry  . Sprain of posterior cruciate ligament of knee right knee  . Vitamin D deficiency 02/2017   high dose vit D started 02/23/17--vit D still low on recheck.  Needs 50K twice per week dosing indefinitely. Vit D  level mid 40s Nov 2019.    Past Surgical History:  Procedure Laterality Date  . CARDIOVASCULAR STRESS TEST  09/19/2018   Low risk  . COLONOSCOPY W/ POLYPECTOMY  09/2012   Dr. 11/18/2018: diverticulosis.  Recall 2024.  Loreta Ave TOTAL ABDOMINAL HYSTERECTOMY W/ BILATERAL SALPINGOOPHORECTOMY  approx 2006   Nonmalignant reasons  . TRANSTHORACIC ECHOCARDIOGRAM  09/19/2018   EF 60-65%, mild LVH, Grd I DD.    Outpatient Medications Prior to Visit  Medication Sig Dispense Refill  . calcium carbonate (TUMS - DOSED IN MG ELEMENTAL CALCIUM) 500 MG chewable tablet Chew 1 tablet by mouth as needed for indigestion or heartburn.    . DULoxetine (CYMBALTA) 60 MG capsule TAKE 1 CAPSULE BY MOUTH EVERY DAY 90 capsule 1  . hydrochlorothiazide (HYDRODIURIL) 25 MG tablet TAKE 1 TABLET BY MOUTH EVERY DAY 90 tablet 0  . levothyroxine (SYNTHROID) 200 MCG tablet TAKE 1 TABLET (200 MCG TOTAL) BY MOUTH DAILY BEFORE BREAKFAST. 90 tablet 1  . metoprolol succinate (TOPROL-XL) 25 MG 24 hr tablet TAKE 1/2 TABLET BY MOUTH EVERY DAY 45 tablet 0  . traMADol (ULTRAM) 50 MG tablet 1-2 tabs po bid prn 40 tablet 0  . Vitamin D, Ergocalciferol, (DRISDOL) 1.25 MG (50000 UT) CAPS capsule TAKE ONE CAPSULE TWICE A WEEK 24 capsule 3  . LYRICA 150 MG capsule TAKE 1 CAPSULE BY MOUTH TWICE A DAY 180 capsule 1  . Cyanocobalamin (VITAMIN B-12)  [place] under the tongue.     No facility-administered medications prior to visit.    Allergies  Allergen Reactions  . Aspirin Shortness Of Breath and Other (See Comments)    nasal congestion  . Nsaids Anaphylaxis  . Sulfa Antibiotics Rash    ROS As per HPI  PE: Blood pressure 118/79, pulse 73, temperature 98.2 F (36.8 C), temperature source Temporal, resp. rate 16, height 5\' 3"  (1.6 m), weight (!) 321 lb (145.6 kg), SpO2 96 %. Gen: Alert, well appearing.  Patient is oriented to person, place, time, and situation. AFFECT: pleasant, lucid thought and speech. No further exam  today.  LABS:    Chemistry      Component Value Date/Time   NA 141 06/07/2019 0934   K 3.8 06/07/2019 0934   CL 102 06/07/2019 0934   CO2 28 06/07/2019 0934   BUN 17 06/07/2019 0934   CREATININE 0.81 06/07/2019 0934   CREATININE 0.75 01/17/2019 0910      Component Value Date/Time   CALCIUM 9.5 06/07/2019 0934   ALKPHOS 65 09/19/2018 0117   AST 22 09/19/2018 0117   ALT 22 09/19/2018 0117   BILITOT 0.6 09/19/2018 0117     Lab Results  Component Value Date   TSH 1.34 06/07/2019   Lab Results  Component Value Date   WBC 5.8 09/06/2019   HGB 13.7 09/06/2019   HCT 41.9 09/06/2019   MCV 83.5 09/06/2019   PLT 338.0 09/06/2019   Lab Results  Component Value Date   CHOL 250 (H) 07/09/2018   HDL 40.70 07/09/2018   LDLCALC 76 10/05/2013   LDLDIRECT 68.0 07/09/2018   TRIG 350.0 (H) 07/09/2018   CHOLHDL 6 07/09/2018   IMPRESSION AND PLAN:  Fibromyalgia acute flare. She has been relatively well controlled on lyrica and cymbalta. Tramadol helpful somewhat but causes too much sedation/cognitive impairment to still allow her to go to work and function.  Will do last option trial of prednisone 40mg  qd x 5d. Out of work starting yesterday and tentative return to work 6 days from now. Consider adding keppra trial attempt to improve control/decrease flares. No change in lyrica or cymbalta or tramadol today. She may need to apply for FMLA intermittent leave. Her flares are infrequent but they do seem to last quite a while (2 wks or so).  An After Visit Summary was printed and given to the patient.  FOLLOW UP: Return in about 4 weeks (around 02/02/2020) for f/u fibromyalgia.  Signed:  Crissie Sickles, MD           01/05/2020

## 2020-01-10 ENCOUNTER — Encounter: Payer: Self-pay | Admitting: Family Medicine

## 2020-01-10 ENCOUNTER — Other Ambulatory Visit: Payer: Self-pay | Admitting: Family Medicine

## 2020-01-10 NOTE — Telephone Encounter (Signed)
OK to extend patient's work note. Pls write note stating the return to work date that pt tells you. -thx

## 2020-01-10 NOTE — Telephone Encounter (Signed)
Patient was seen 4/29 and given work note with tentative return date of 01/11/20.   Please advise if okay to extend work note?

## 2020-01-18 ENCOUNTER — Other Ambulatory Visit: Payer: Self-pay | Admitting: Family Medicine

## 2020-02-06 ENCOUNTER — Other Ambulatory Visit: Payer: Self-pay | Admitting: Family Medicine

## 2020-02-07 NOTE — Telephone Encounter (Signed)
RF request for Vitamin D 50,000 units.  Last RX 01/03/2019 # 24 x 3 refills.  Last Vitamin D lab 07/10/2019  Please advise if appropriate to fill?

## 2020-02-08 ENCOUNTER — Encounter: Payer: Self-pay | Admitting: Family Medicine

## 2020-03-23 ENCOUNTER — Other Ambulatory Visit: Payer: Self-pay | Admitting: Family Medicine

## 2020-03-30 ENCOUNTER — Other Ambulatory Visit: Payer: Self-pay | Admitting: Family Medicine

## 2020-07-11 ENCOUNTER — Other Ambulatory Visit: Payer: Self-pay | Admitting: Family Medicine

## 2020-07-29 ENCOUNTER — Other Ambulatory Visit: Payer: Self-pay | Admitting: Family Medicine

## 2020-08-05 ENCOUNTER — Other Ambulatory Visit: Payer: Self-pay | Admitting: Family Medicine

## 2020-08-09 NOTE — Telephone Encounter (Signed)
LM for pt to return call. Needs to schedule f/u appt with PCP

## 2020-08-29 ENCOUNTER — Other Ambulatory Visit: Payer: Self-pay | Admitting: Family Medicine

## 2020-09-29 ENCOUNTER — Other Ambulatory Visit: Payer: Self-pay | Admitting: Family Medicine

## 2020-10-02 NOTE — Telephone Encounter (Signed)
Detailed message left advising refills sent.

## 2020-10-02 NOTE — Telephone Encounter (Signed)
RF request for duloxetine  LOV:01/05/20 Next ov: 10/11/20 Last written:01/18/20(90,1)  RF request for pregabalin LOV:01/05/20 Next ov: 10/11/20 Last written:01/05/20(180,1)  RF request for hydrochlorothiazide LOV:01/05/20 Next ov: 10/11/20 Last written:08/10/20(30,0)  Medications pending. Please advise, thanks.

## 2020-10-11 ENCOUNTER — Encounter: Payer: Self-pay | Admitting: Family Medicine

## 2020-10-11 ENCOUNTER — Other Ambulatory Visit: Payer: Self-pay

## 2020-10-11 ENCOUNTER — Ambulatory Visit: Payer: Federal, State, Local not specified - PPO | Admitting: Family Medicine

## 2020-10-11 VITALS — BP 135/83 | HR 59 | Temp 97.8°F | Resp 16 | Ht 63.0 in | Wt 313.4 lb

## 2020-10-11 DIAGNOSIS — E039 Hypothyroidism, unspecified: Secondary | ICD-10-CM

## 2020-10-11 DIAGNOSIS — I10 Essential (primary) hypertension: Secondary | ICD-10-CM

## 2020-10-11 DIAGNOSIS — M797 Fibromyalgia: Secondary | ICD-10-CM | POA: Diagnosis not present

## 2020-10-11 DIAGNOSIS — E559 Vitamin D deficiency, unspecified: Secondary | ICD-10-CM | POA: Diagnosis not present

## 2020-10-11 DIAGNOSIS — R5382 Chronic fatigue, unspecified: Secondary | ICD-10-CM

## 2020-10-11 LAB — COMPREHENSIVE METABOLIC PANEL
ALT: 19 U/L (ref 0–35)
AST: 19 U/L (ref 0–37)
Albumin: 4 g/dL (ref 3.5–5.2)
Alkaline Phosphatase: 50 U/L (ref 39–117)
BUN: 15 mg/dL (ref 6–23)
CO2: 33 mEq/L — ABNORMAL HIGH (ref 19–32)
Calcium: 9.5 mg/dL (ref 8.4–10.5)
Chloride: 103 mEq/L (ref 96–112)
Creatinine, Ser: 0.77 mg/dL (ref 0.40–1.20)
GFR: 84.84 mL/min (ref >60.00)
Glucose, Bld: 98 mg/dL (ref 70–99)
Potassium: 3.9 mEq/L (ref 3.5–5.1)
Sodium: 140 mEq/L (ref 135–145)
Total Bilirubin: 0.6 mg/dL (ref 0.2–1.2)
Total Protein: 6.6 g/dL (ref 6.0–8.3)

## 2020-10-11 LAB — CBC WITH DIFFERENTIAL/PLATELET
Basophils Absolute: 0 10*3/uL (ref 0.0–0.1)
Basophils Relative: 0.5 % (ref 0.0–3.0)
Eosinophils Absolute: 0.2 10*3/uL (ref 0.0–0.7)
Eosinophils Relative: 3.8 % (ref 0.0–5.0)
HCT: 40.9 % (ref 36.0–46.0)
Hemoglobin: 13.7 g/dL (ref 12.0–15.0)
Lymphocytes Relative: 34.7 % (ref 12.0–46.0)
Lymphs Abs: 1.7 10*3/uL (ref 0.7–4.0)
MCHC: 33.4 g/dL (ref 30.0–36.0)
MCV: 83.6 fl (ref 78.0–100.0)
Monocytes Absolute: 0.4 10*3/uL (ref 0.1–1.0)
Monocytes Relative: 8.2 % (ref 3.0–12.0)
Neutro Abs: 2.7 10*3/uL (ref 1.4–7.7)
Neutrophils Relative %: 52.8 % (ref 43.0–77.0)
Platelets: 312 10*3/uL (ref 150.0–400.0)
RBC: 4.89 Mil/uL (ref 3.87–5.11)
RDW: 14.3 % (ref 11.5–15.5)
WBC: 5 10*3/uL (ref 4.0–10.5)

## 2020-10-11 LAB — VITAMIN D 25 HYDROXY (VIT D DEFICIENCY, FRACTURES): VITD: 52.24 ng/mL (ref 30.00–100.00)

## 2020-10-11 LAB — TSH: TSH: 0.5 u[IU]/mL (ref 0.35–4.50)

## 2020-10-11 NOTE — Progress Notes (Signed)
OFFICE VISIT  10/11/2020  CC:  Chief Complaint  Patient presents with  . Follow-up    RCI, pt is fasting   HPI:    Patient is a 60 y.o. Caucasian female who presents for f/u fibromyalgia, chronic fatigue, HTN, vit D def, and hypothyroidism. I last saw her 01/05/20. A/P as of that visit: "Fibromyalgia acute flare. She has been relatively well controlled on lyrica and cymbalta. Tramadol helpful somewhat but causes too much sedation/cognitive impairment to still allow her to go to work and function.  Will do last option trial of prednisone 40mg  qd x 5d. Out of work starting yesterday and tentative return to work 6 days from now. Consider adding keppra trial attempt to improve control/decrease flares. No change in lyrica or cymbalta or tramadol today. She may need to apply for FMLA intermittent leave. Her flares are infrequent but they do seem to last quite a while (2 wks or so)."  INTERIM HX: Widespread body pain/aching/burning. Was using lyrica only qd for a while b/c running out.  Then out x 2 wks. Just got back on it about 1 wk ago and not noticing much diff yet.  Brief retry of gabapentin no help.   Not taking tramadol b/c says it didn't really help more than tylenol. Energy level is fair.   Busy/stressed with husbands recent illness (NHL, getting chemo).  Hypoth: Takes T4 on empty stomach w/out any other meds. HTN: no home bp monitoring.  PMP AWARE reviewed today: most recent rx for lyrica 150mg  was filled 10/03/20, # 180, rx by me. Most recent tramadol rx filled 06/07/19, #28, rx by me. No red flags.  ROS: no fevers, no CP, no SOB, no wheezing, no cough, no dizziness, no HAs, no rashes, no melena/hematochezia.  No polyuria or polydipsia. No focal weakness, paresthesias, or tremors.  No acute vision or hearing abnormalities. No n/v/d or abd pain.  No palpitations.    Past Medical History:  Diagnosis Date  . Allergy   . Asthma   . Fibromyalgia 2017  . Herniated disc L4-L5   . High triglycerides    mild.  TLC.  06/09/19 Hypertension   . Hypothyroidism   . IBS (irritable bowel syndrome)   . Internal hemorrhoids   . Neuropathic pain    Rx'd neurontin by her orthopedist  . Palpitations 09/2018   toprol xl started by cardiologist->no tachyarrhythmia on telemetry  . Sprain of posterior cruciate ligament of knee right knee  . Vitamin D deficiency 02/2017   high dose vit D started 02/23/17--vit D still low on recheck.  Needs 50K twice per week dosing indefinitely. Vit D level mid 40s Nov 2019.    Past Surgical History:  Procedure Laterality Date  . CARDIOVASCULAR STRESS TEST  09/19/2018   Low risk  . COLONOSCOPY W/ POLYPECTOMY  09/2012   Dr. 11/18/2018: diverticulosis.  Recall 2024.  Loreta Ave TOTAL ABDOMINAL HYSTERECTOMY W/ BILATERAL SALPINGOOPHORECTOMY  approx 2006   Nonmalignant reasons  . TRANSTHORACIC ECHOCARDIOGRAM  09/19/2018   EF 60-65%, mild LVH, Grd I DD.    Outpatient Medications Prior to Visit  Medication Sig Dispense Refill  . DULoxetine (CYMBALTA) 60 MG capsule TAKE 1 CAPSULE BY MOUTH EVERY DAY 90 capsule 3  . hydrochlorothiazide (HYDRODIURIL) 25 MG tablet TAKE 1 TABLET BY MOUTH EVERY DAY 90 tablet 3  . levothyroxine (SYNTHROID) 200 MCG tablet TAKE 1 TABLET (200 MCG TOTAL) BY MOUTH DAILY BEFORE BREAKFAST. 90 tablet 1  . LYRICA 150 MG capsule TAKE 1 CAPSULE BY  MOUTH TWICE A DAY 180 capsule 1  . metoprolol succinate (TOPROL-XL) 25 MG 24 hr tablet TAKE 1/2 TABLET BY MOUTH EVERY DAY 45 tablet 1  . Vitamin D, Ergocalciferol, (DRISDOL) 1.25 MG (50000 UNIT) CAPS capsule TAKE 1 CAPSULE BY MOUTH TWICE A WEEK 24 capsule 3  . calcium carbonate (TUMS - DOSED IN MG ELEMENTAL CALCIUM) 500 MG chewable tablet Chew 1 tablet by mouth as needed for indigestion or heartburn. (Patient not taking: Reported on 10/11/2020)    . Cyanocobalamin (VITAMIN B-12) 5000 MCG SUBL Place under the tongue. (Patient not taking: Reported on 10/11/2020)    . predniSONE (DELTASONE) 20 MG tablet 2 tabs po  qd x 5d (Patient not taking: Reported on 10/11/2020) 10 tablet 0  . traMADol (ULTRAM) 50 MG tablet 1-2 tabs po bid prn (Patient not taking: Reported on 10/11/2020) 40 tablet 0   No facility-administered medications prior to visit.    Allergies  Allergen Reactions  . Aspirin Shortness Of Breath and Other (See Comments)    nasal congestion  . Nsaids Anaphylaxis  . Sulfa Antibiotics Rash    ROS As per HPI  PE: Vitals with BMI 10/11/2020 01/05/2020 08/02/2019  Height 5\' 3"  5\' 3"  -  Weight 313 lbs 6 oz 321 lbs -  BMI 55.53 56.88 -  Systolic 135 118  Diastolic 83 79 97  Pulse 59 73 82     Gen: Alert, well appearing.  Patient is oriented to person, place, time, and situation. AFFECT: pleasant, lucid thought and speech. CV: RRR, no m/r/g.   LUNGS: CTA bilat, nonlabored resps, good aeration in all lung fields. EXT: no clubbing or cyanosis.  no edema.    LABS:  Lab Results  Component Value Date   TSH 1.34 06/07/2019   Lab Results  Component Value Date   WBC 5.8 09/06/2019   HGB 13.7 09/06/2019   HCT 41.9 09/06/2019   MCV 83.5 09/06/2019   PLT 338.0 09/06/2019   Lab Results  Component Value Date   CREATININE 0.81 06/07/2019   BUN 17 06/07/2019   NA 141 06/07/2019   K 3.8 06/07/2019   CL 102 06/07/2019   CO2 28 06/07/2019   Lab Results  Component Value Date   ALT 22 09/19/2018   AST 22 09/19/2018   ALKPHOS 65 09/19/2018   BILITOT 0.6 09/19/2018   Lab Results  Component Value Date   CHOL 250 (H) 07/09/2018   Lab Results  Component Value Date   HDL 40.70 07/09/2018   Lab Results  Component Value Date   LDLCALC 76 10/05/2013   Lab Results  Component Value Date   TRIG 350.0 (H) 07/09/2018   Lab Results  Component Value Date   CHOLHDL 6 07/09/2018   Lab Results  Component Value Date   HGBA1C 5.3 09/19/2018   Lab Results  Component Value Date   VITAMINB12 244 02/20/2017   Vit D level  48.8 on 07/09/18  IMPRESSION AND PLAN:  1) Fibromyalgia:  stable when consistently on lyrica 150mg  bid and cymbalta 60mg  qd. Recent time off lyrica has resulted in mild relapse of sx's, hopefully will begin to feel better soon since getting back on this med about a week ago, although she feels like brand name helped a lot more than this recent rx of generic lyrica. No changes today. Tramadol taken off med list b/c not taking this med b/c no help. CSC for lyrica UTD.  2) HTN: stable. Cont hctz 25mg  qd. BMET today.  3)  Hypothyroidism: TSH monitoring today.  4) Vit d def: cont 50 K Unit tab twice per week. Vit D level check today.  An After Visit Summary was printed and given to the patient.  FOLLOW UP: Return in about 6 months (around 04/10/2021) for annual CPE (fasting).  Signed:  Santiago Bumpers, MD           10/11/2020

## 2020-10-21 ENCOUNTER — Other Ambulatory Visit: Payer: Self-pay | Admitting: Family Medicine

## 2020-12-18 ENCOUNTER — Other Ambulatory Visit: Payer: Self-pay | Admitting: Family Medicine

## 2021-01-05 IMAGING — CR DG KNEE 1-2V*L*
1 series · 2 of 2 positions shown · non-contrast
Comparison: None.

CLINICAL DATA: Recent trip and fall with knee pain, initial
encounter

EXAM:
LEFT KNEE - 2 VIEW

[Series 1: dg knee 1-2 views left · 0.14mm/px · 2 of 2 slices shown]
[im 1/2]
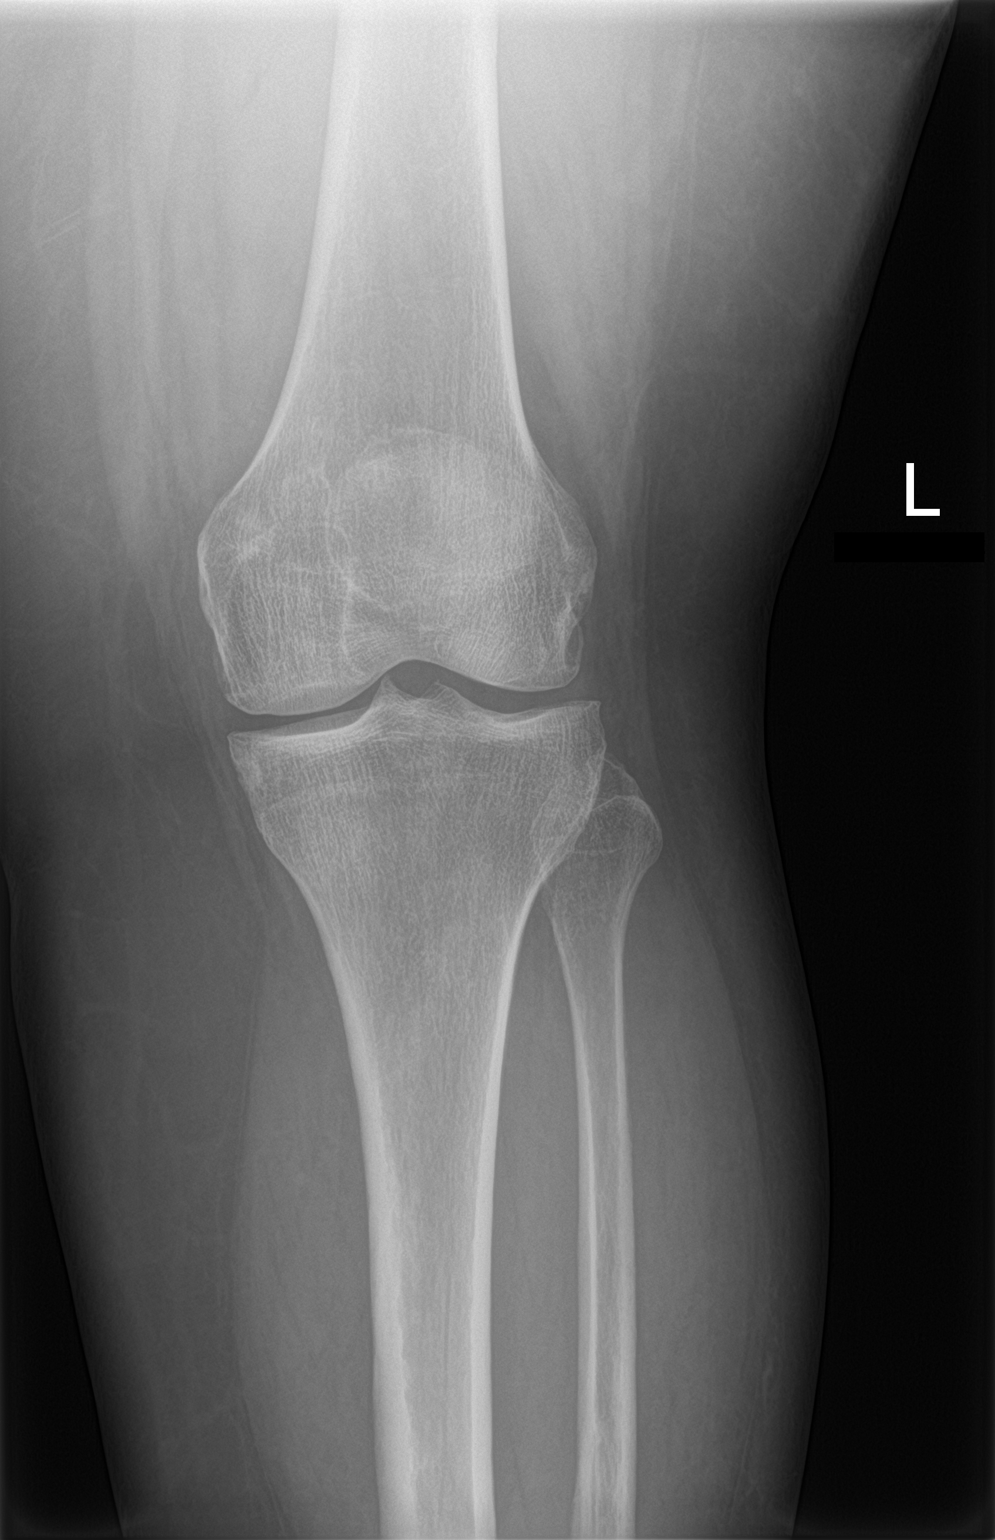
[im 2/2]
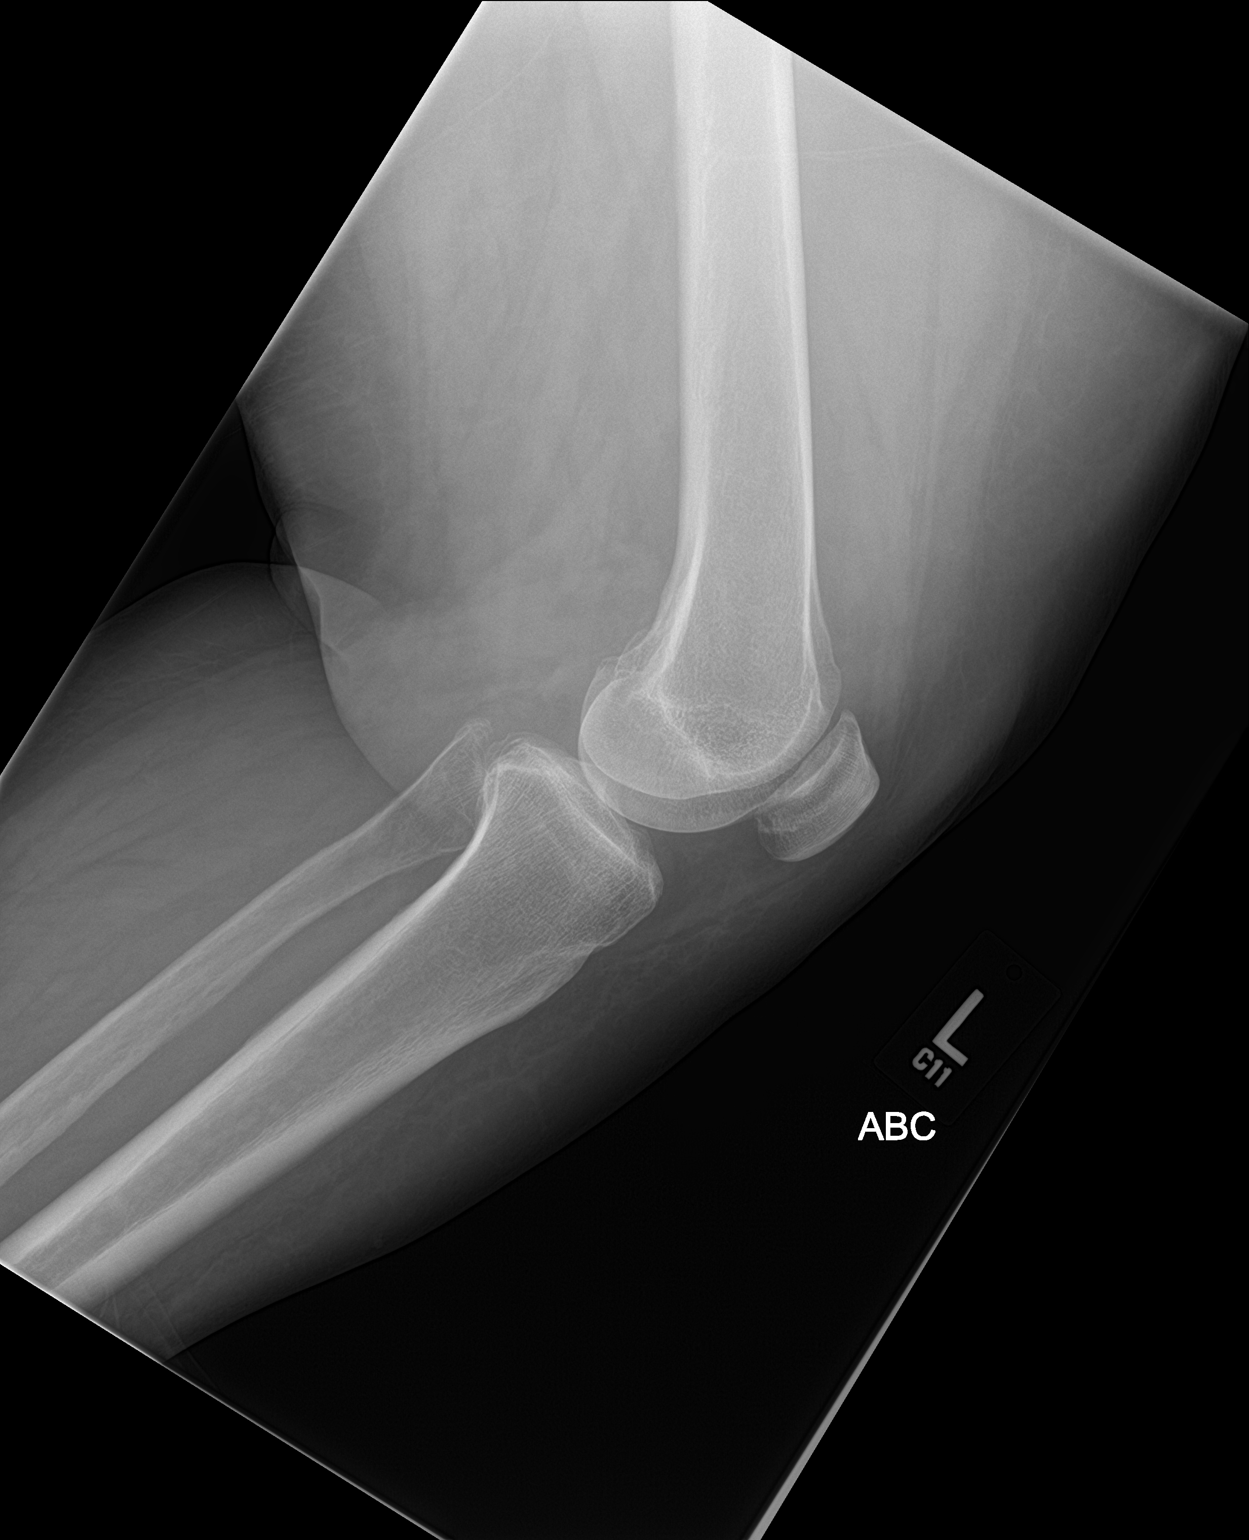

[2 of 2 positions shown; findings below may reference images not displayed]

FINDINGS: No evidence of fracture, dislocation, or joint effusion. No evidence
of arthropathy or other focal bone abnormality. Soft tissues are
unremarkable.
IMPRESSION: No acute abnormality noted.

## 2021-01-06 DIAGNOSIS — M1712 Unilateral primary osteoarthritis, left knee: Secondary | ICD-10-CM

## 2021-01-06 HISTORY — DX: Unilateral primary osteoarthritis, left knee: M17.12

## 2021-01-14 DIAGNOSIS — M1712 Unilateral primary osteoarthritis, left knee: Secondary | ICD-10-CM | POA: Diagnosis not present

## 2021-01-16 ENCOUNTER — Encounter: Payer: Self-pay | Admitting: Family Medicine

## 2021-03-15 ENCOUNTER — Other Ambulatory Visit: Payer: Self-pay | Admitting: Family Medicine

## 2021-03-28 ENCOUNTER — Other Ambulatory Visit: Payer: Self-pay | Admitting: Family Medicine

## 2021-04-11 ENCOUNTER — Ambulatory Visit (INDEPENDENT_AMBULATORY_CARE_PROVIDER_SITE_OTHER): Payer: BC Managed Care – PPO | Admitting: Family Medicine

## 2021-04-11 ENCOUNTER — Encounter: Payer: Self-pay | Admitting: Family Medicine

## 2021-04-11 ENCOUNTER — Other Ambulatory Visit: Payer: Self-pay

## 2021-04-11 VITALS — BP 90/50 | HR 63 | Temp 98.2°F | Ht 63.0 in | Wt 321.6 lb

## 2021-04-11 DIAGNOSIS — E559 Vitamin D deficiency, unspecified: Secondary | ICD-10-CM

## 2021-04-11 DIAGNOSIS — Z1231 Encounter for screening mammogram for malignant neoplasm of breast: Secondary | ICD-10-CM

## 2021-04-11 DIAGNOSIS — Z23 Encounter for immunization: Secondary | ICD-10-CM

## 2021-04-11 DIAGNOSIS — E039 Hypothyroidism, unspecified: Secondary | ICD-10-CM | POA: Diagnosis not present

## 2021-04-11 DIAGNOSIS — R252 Cramp and spasm: Secondary | ICD-10-CM

## 2021-04-11 DIAGNOSIS — I10 Essential (primary) hypertension: Secondary | ICD-10-CM

## 2021-04-11 DIAGNOSIS — E538 Deficiency of other specified B group vitamins: Secondary | ICD-10-CM

## 2021-04-11 DIAGNOSIS — E781 Pure hyperglyceridemia: Secondary | ICD-10-CM | POA: Diagnosis not present

## 2021-04-11 DIAGNOSIS — Z0000 Encounter for general adult medical examination without abnormal findings: Secondary | ICD-10-CM | POA: Diagnosis not present

## 2021-04-11 DIAGNOSIS — M797 Fibromyalgia: Secondary | ICD-10-CM

## 2021-04-11 LAB — VITAMIN B12: Vitamin B-12: >1550 pg/mL — ABNORMAL HIGH (ref 211–911)

## 2021-04-11 LAB — COMPREHENSIVE METABOLIC PANEL
ALT: 22 U/L (ref 0–35)
AST: 19 U/L (ref 0–37)
Albumin: 3.9 g/dL (ref 3.5–5.2)
Alkaline Phosphatase: 47 U/L (ref 39–117)
BUN: 12 mg/dL (ref 6–23)
CO2: 30 mEq/L (ref 19–32)
Calcium: 9.5 mg/dL (ref 8.4–10.5)
Chloride: 103 mEq/L (ref 96–112)
Creatinine, Ser: 0.77 mg/dL (ref 0.40–1.20)
GFR: 84.54 mL/min (ref >60.00)
Glucose, Bld: 104 mg/dL — ABNORMAL HIGH (ref 70–99)
Potassium: 4 mEq/L (ref 3.5–5.1)
Sodium: 141 mEq/L (ref 135–145)
Total Bilirubin: 0.6 mg/dL (ref 0.2–1.2)
Total Protein: 6.4 g/dL (ref 6.0–8.3)

## 2021-04-11 LAB — CBC WITH DIFFERENTIAL/PLATELET
Basophils Absolute: 0 10*3/uL (ref 0.0–0.1)
Basophils Relative: 0.6 % (ref 0.0–3.0)
Eosinophils Absolute: 0.2 10*3/uL (ref 0.0–0.7)
Eosinophils Relative: 4.2 % (ref 0.0–5.0)
HCT: 40.3 % (ref 36.0–46.0)
Hemoglobin: 13.4 g/dL (ref 12.0–15.0)
Lymphocytes Relative: 31.2 % (ref 12.0–46.0)
Lymphs Abs: 1.7 10*3/uL (ref 0.7–4.0)
MCHC: 33.2 g/dL (ref 30.0–36.0)
MCV: 85.2 fl (ref 78.0–100.0)
Monocytes Absolute: 0.5 10*3/uL (ref 0.1–1.0)
Monocytes Relative: 8.2 % (ref 3.0–12.0)
Neutro Abs: 3.1 10*3/uL (ref 1.4–7.7)
Neutrophils Relative %: 55.8 % (ref 43.0–77.0)
Platelets: 280 10*3/uL (ref 150.0–400.0)
RBC: 4.73 Mil/uL (ref 3.87–5.11)
RDW: 14.6 % (ref 11.5–15.5)
WBC: 5.5 10*3/uL (ref 4.0–10.5)

## 2021-04-11 LAB — LIPID PANEL
Cholesterol: 235 mg/dL — ABNORMAL HIGH (ref 0–200)
HDL: 42 mg/dL (ref >39.00)
NonHDL: 193.05
Total CHOL/HDL Ratio: 6
Triglycerides: 359 mg/dL — ABNORMAL HIGH (ref 0.0–149.0)
VLDL: 71.8 mg/dL — ABNORMAL HIGH (ref 0.0–40.0)

## 2021-04-11 LAB — VITAMIN D 25 HYDROXY (VIT D DEFICIENCY, FRACTURES): VITD: 50.92 ng/mL (ref 30.00–100.00)

## 2021-04-11 LAB — MAGNESIUM: Magnesium: 1.8 mg/dL (ref 1.5–2.5)

## 2021-04-11 LAB — TSH: TSH: 0.32 u[IU]/mL — ABNORMAL LOW (ref 0.35–5.50)

## 2021-04-11 LAB — LDL CHOLESTEROL, DIRECT: Direct LDL: 57 mg/dL

## 2021-04-11 MED ORDER — LEVOTHYROXINE SODIUM 200 MCG PO TABS: 200.0000 ug | ORAL_TABLET | Freq: Every day | ORAL | 3 refills | Status: DC

## 2021-04-11 MED ORDER — METOPROLOL SUCCINATE ER 25 MG PO TB24: 12.5000 mg | ORAL_TABLET | Freq: Every day | ORAL | 3 refills | Status: DC

## 2021-04-11 MED ORDER — PREGABALIN 150 MG PO CAPS: 150.0000 mg | ORAL_CAPSULE | Freq: Two times a day (BID) | ORAL | 5 refills | Status: DC

## 2021-04-11 MED ORDER — DULOXETINE HCL 60 MG PO CPEP: ORAL_CAPSULE | ORAL | 3 refills | Status: DC

## 2021-04-11 NOTE — Progress Notes (Signed)
Office Note 04/11/2021  CC:  Chief Complaint  Patient presents with   Annual Exam    Pt is fasting   HPI:  Brenda Schroeder is a 60 y.o. White female who is here today for annual health maintenance exam and 6 mo f/u fibromyalgia with chronic fatigue, hypothyroidism, and HTN. A/P as of last visit: "1) Fibromyalgia: stable when consistently on lyrica 150mg  bid and cymbalta 60mg  qd. Recent time off lyrica has resulted in mild relapse of sx's, hopefully will begin to feel better soon since getting back on this med about a week ago, although she feels like brand name helped a lot more than this recent rx of generic lyrica. No changes today. Tramadol taken off med list b/c not taking this med b/c no help. CSC for lyrica UTD.   2) HTN: stable. Cont hctz 25mg  qd. BMET today.   3) Hypothyroidism: TSH monitoring today.   4) Vit d def: cont 50 K Unit tab twice per week. Vit D level check today"  INTERIM HX: About 10d having R glut pain rad down R leg to mid LL area laterally, trying biofreeze Has hx of this, got PT and chiropractor care for it in the past.  HTN: no recent home monitoring.  BP 90/50 here today.  She has not had hx of low bp. No dizziness. Feels more washed out/fatigued than usual.  Fibro/pain: lyrica 150 bid and cymbalta 60 qd. PMP AWARE reviewed today: most recent rx for lyrica 150 was filled 03/28/21, # 180, rx by me. No red flags.  Hypoth: takes T4 daily on empty stomach.  Takes 5000 mcg sl b12 for her fatigue as well.  Hx of borderline b12 low.   Past Medical History:  Diagnosis Date   Allergy    Asthma    Fibromyalgia 2017   Herniated disc L4-L5   High triglycerides    mild.  TLC.   Hypertension    Hypothyroidism    IBS (irritable bowel syndrome)    Internal hemorrhoids    Neuropathic pain    Rx'd neurontin by her orthopedist   Osteoarthritis of left knee 01/2021   [facility]   Palpitations 09/2018   toprol xl started by cardiologist->no  tachyarrhythmia on telemetry   Sprain of posterior cruciate ligament of knee right knee   Vitamin D deficiency 02/2017   high dose vit D started 02/23/17--vit D still low on recheck.  Needs 50K twice per week dosing indefinitely. Vit D level mid 40s Nov 2019.    Past Surgical History:  Procedure Laterality Date   CARDIOVASCULAR STRESS TEST  09/19/2018   Low risk   COLONOSCOPY W/ POLYPECTOMY  09/2012   Dr. Loreta AveMann: diverticulosis.  Recall 2024.   TOTAL ABDOMINAL HYSTERECTOMY W/ BILATERAL SALPINGOOPHORECTOMY  approx 2006   Nonmalignant reasons   TRANSTHORACIC ECHOCARDIOGRAM  09/19/2018   EF 60-65%, mild LVH, Grd I DD.    Family History  Problem Relation Age of Onset   Arthritis Mother        osteo arithritis   Obesity Mother    Fibroids Mother        uterine   Lymphoma Father    Sarcoidosis Father    Pulmonary fibrosis Father    Arthritis Maternal Grandmother        rheumatoid   Cancer Maternal Grandmother        cervical   Heart disease Maternal Grandfather        a-fib   COPD Maternal Grandfather  emphysema   Vision loss Paternal Grandmother    Emphysema Paternal Grandfather    Lung cancer Paternal Grandfather    Celiac disease Sister    Asthma Son    Cancer Daughter        ovarian   Obesity Sister     Social History   Socioeconomic History   Marital status: Married    Spouse name: Not on file   Number of children: Not on file   Years of education: Not on file   Highest education level: Not on file  Occupational History   Not on file  Tobacco Use   Smoking status: Never   Smokeless tobacco: Never  Substance and Sexual Activity   Alcohol use: No    Comment: rarely   Drug use: No   Sexual activity: Yes    Partners: Male    Birth control/protection: None  Other Topics Concern   Not on file  Social History Narrative   Married 26 yrs, 3 children (1 daught, 2 sons).   [place] from [place].   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   No tobacco, very rare alcohol, no drugs.         Social Determinants of Health   Financial Resource Strain: Not on file  Food Insecurity: Not on file  Transportation Needs: Not on file  Physical Activity: Not on file  Stress: Not on file  Social Connections: Not on file  Intimate Partner Violence: Not on file    Outpatient Medications Prior to Visit  Medication Sig Dispense Refill   Cyanocobalamin (VITAMIN B-12 PO) Take by mouth daily.     hydrochlorothiazide (HYDRODIURIL) 25 MG tablet TAKE 1 TABLET BY MOUTH EVERY DAY 90 tablet 3   OVER THE COUNTER MEDICATION daily. Cytokine Suppress     Vitamin D, Ergocalciferol, (DRISDOL) 1.25 MG (50000 UNIT) CAPS capsule TAKE 1 CAPSULE BY MOUTH TWICE WEEKLY 24 capsule 2   LYRICA 150 MG capsule TAKE 1 CAPSULE BY MOUTH TWICE A DAY 180 capsule 1   DULoxetine (CYMBALTA) 60 MG capsule TAKE 1 CAPSULE BY MOUTH EVERY DAY 30 capsule 0   levothyroxine (SYNTHROID) 200 MCG tablet TAKE 1 TABLET (200 MCG TOTAL) BY MOUTH DAILY BEFORE BREAKFAST. 30 tablet 0   metoprolol succinate (TOPROL-XL) 25 MG 24 hr tablet TAKE 1/2 TABLET BY MOUTH EVERY DAY 15 tablet 0   No facility-administered medications prior to visit.    Allergies  Allergen Reactions   Aspirin Shortness Of Breath and Other (See Comments)    nasal congestion   Nsaids Anaphylaxis   Sulfa Antibiotics Rash    ROS Review of Systems  Constitutional:  Negative for appetite change, chills, fatigue and fever.  HENT:  Negative for congestion, dental problem, ear pain and sore throat.   Eyes:  Negative for discharge, redness and visual disturbance.  Respiratory:  Negative for cough, chest tightness, shortness of breath and wheezing.   Cardiovascular:  Negative for chest pain, palpitations and leg swelling.  Gastrointestinal:  Negative for abdominal pain, blood in stool, diarrhea, nausea and vomiting.  Genitourinary:  Negative for difficulty urinating, dysuria, flank pain,  frequency, hematuria and urgency.  Musculoskeletal:  Positive for myalgias (chronic, widespread). Negative for arthralgias, back pain, joint swelling and neck stiffness.  Skin:  Negative for pallor and rash.  Neurological:  Negative for dizziness, speech difficulty, weakness and headaches.  Hematological:  Negative for adenopathy. Does not bruise/bleed easily.  Psychiatric/Behavioral:  Negative for confusion  and sleep disturbance. The patient is not nervous/anxious.    PE; Vitals with BMI 04/11/2021 10/11/2020 01/05/2020  Height 5\' 3"  5\' 3"  5\' 3"   Weight 321 lbs 10 oz 313 lbs 6 oz 321 lbs  BMI 56.98 55.53 56.88  Systolic 90 135 118  Diastolic 50 83 79  Pulse 63 59 73   Exam chaperoned by , CMA. Gen: Alert, well appearing.  Patient is oriented to person, place, time, and situation. AFFECT: pleasant, lucid thought and speech. ENT: Ears: EACs clear, normal epithelium.  TMs with good light reflex and landmarks bilaterally.  Eyes: no injection, icteris, swelling, or exudate.  EOMI, PERRLA. Nose: no drainage or turbinate edema/swelling.  No injection or focal lesion.  Mouth: lips without lesion/swelling.  Oral mucosa pink and moist.  Dentition intact and without obvious caries or gingival swelling.  Oropharynx without erythema, exudate, or swelling.  Neck: supple/nontender.  No LAD, mass, or TM.  Carotid pulses 2+ bilaterally, without bruits. CV: RRR, no m/r/g.   LUNGS: CTA bilat, nonlabored resps, good aeration in all lung fields. ABD: soft, NT, ND, BS normal.  No hepatospenomegaly or mass.  No bruits. EXT: no clubbing, cyanosis, or edema.  Musculoskeletal: no joint swelling, erythema, or warmth.  She has diffuse mild muscular tenderness that is mild, mostly hips and thighs and LLs.  ROM of all joints intact. Skin - no sores or suspicious lesions or rashes or color changes  Pertinent labs:   Vit D 10/11/20 -->52  Lab Results  Component Value Date   VITAMINB12 244 02/20/2017     Lab Results  Component Value Date   TSH 0.50 10/11/2020   Lab Results  Component Value Date   WBC 5.0 10/11/2020   HGB 13.7 10/11/2020   HCT 40.9 10/11/2020   MCV 83.6 10/11/2020   PLT 312.0 10/11/2020   Lab Results  Component Value Date   CREATININE 0.77 10/11/2020   BUN 15 10/11/2020   NA 140 10/11/2020   K 3.9 10/11/2020   CL 103 10/11/2020   CO2 33 (H) 10/11/2020   Lab Results  Component Value Date   ALT 19 10/11/2020   AST 19 10/11/2020   ALKPHOS 50 10/11/2020   BILITOT 0.6 10/11/2020   Lab Results  Component Value Date   CHOL 250 (H) 07/09/2018   Lab Results  Component Value Date   HDL 40.70 07/09/2018   Lab Results  Component Value Date   LDLCALC 76 10/05/2013   Lab Results  Component Value Date   TRIG 350.0 (H) 07/09/2018   Lab Results  Component Value Date   CHOLHDL 6 07/09/2018   Lab Results  Component Value Date   HGBA1C 5.3 09/19/2018   ASSESSMENT AND PLAN:   1) Low bp, treated with 1/2 25mg  tab toprol xl qd and hctz qd-->stop toprol xl and monitor bp and call if not rising over 110/60. Lytes/cr today.  2) Fibromyalgia, fatigue.  Recent R sciatica and LL nocturnal muscle cramps complicating this situation.  Hx of borderline B12 level in the past.   Checking labs today including magnesium and b12 levels. Lyrica brand name rx'd today b/c she says it helped better and only had to take qd.  3) Hypoth: Takes T4 on empty stomach w/out any other meds. TSH today.  4)Health maintenance exam: Reviewed age and gender appropriate health maintenance issues (prudent diet, regular exercise, health risks of tobacco and excessive alcohol, use of seatbelts, fire alarms in home, use of sunscreen).  Also reviewed  age and gender appropriate health screening as well as vaccine recommendations. Vaccines: Tdap due-->given today.  Otherwise ALL UTD. Labs: fasting HP + vit D level (vit D def). Cervical ca screening: no further screening indicated b/c hx of  TAH for nonmalignant dx. Breast ca screening: due for mammogram->ordered today. Colon ca screening: recall 2024.  An After Visit Summary was printed and given to the patient.  FOLLOW UP:  Return in about 6 months (around 10/12/2021) for routine chronic illness f/u.  Signed:  Santiago Bumpers, MD           04/11/2021

## 2021-04-11 NOTE — Patient Instructions (Signed)
Stop metoprolol. Monitor your bp and hr a few times a week and call if not rising up over 110 on top or 60 on bottom.

## 2021-04-12 ENCOUNTER — Other Ambulatory Visit: Payer: Self-pay

## 2021-04-12 DIAGNOSIS — E782 Mixed hyperlipidemia: Secondary | ICD-10-CM

## 2021-04-12 MED ORDER — FENOFIBRATE 160 MG PO TABS: 160.0000 mg | ORAL_TABLET | Freq: Every day | ORAL | 2 refills | Status: DC

## 2021-04-26 IMAGING — CT CT CHEST W/O CM
2 of 4 series · 15 of 36 positions shown, 18 images · non-contrast
Comparison: Chest radiograph October 26, 2011

CLINICAL DATA: Progressive shortness of breath with exertion

EXAM:
CT CHEST WITHOUT CONTRAST
TECHNIQUE: Multidetector CT imaging of the chest was performed following the
standard protocol without IV contrast.

[Series 2: chest 2.00 · axial · 0.73mm/px · z∈[-1191,-927]mm · 12 of 157 slices shown, 15 images]
[im 13/157  mediastinal]
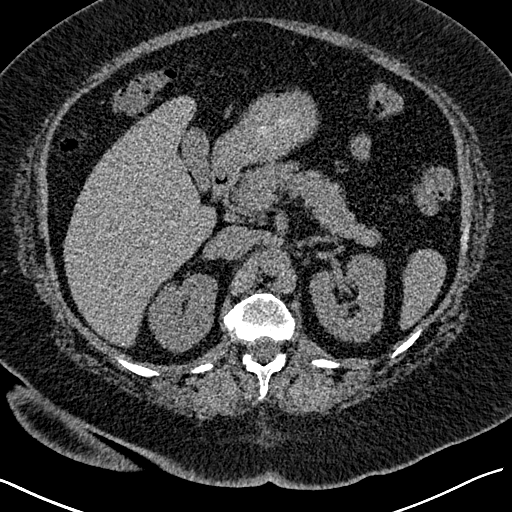
[im 13/157  lung]
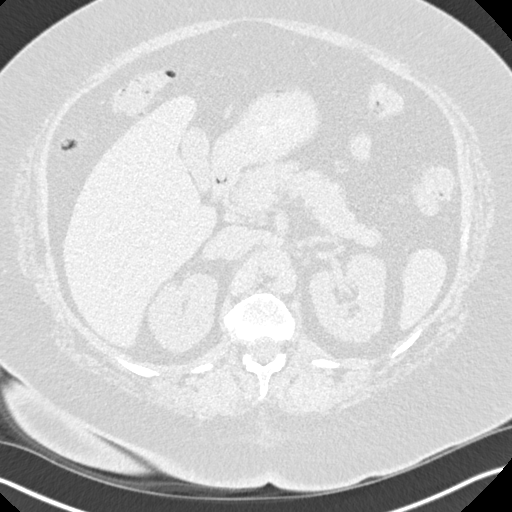
[im 25/157  lung]
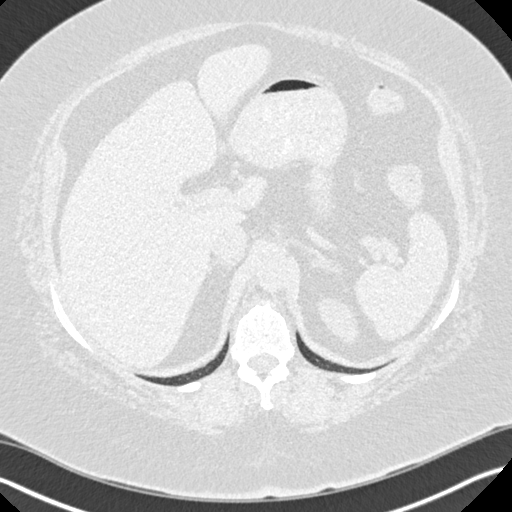
[im 37/157  lung]
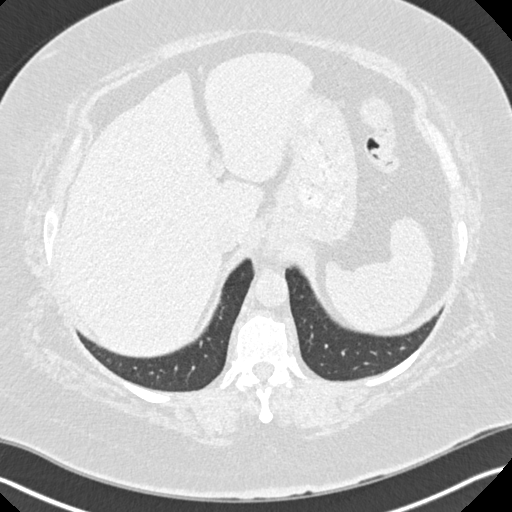
[im 49/157  lung]
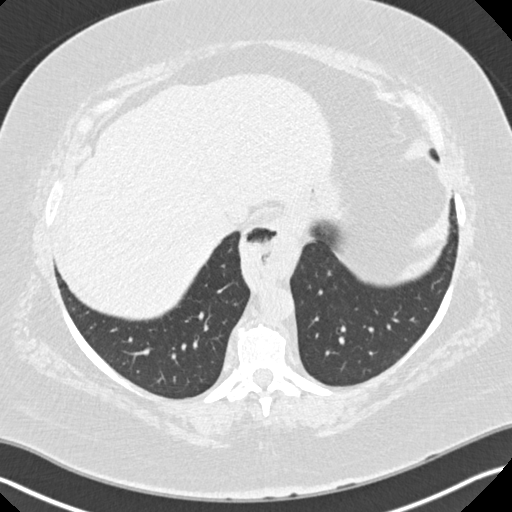
[im 61/157  mediastinal]
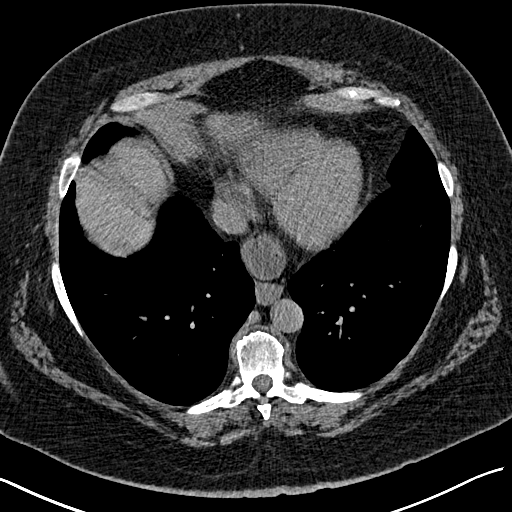
[im 61/157  lung]
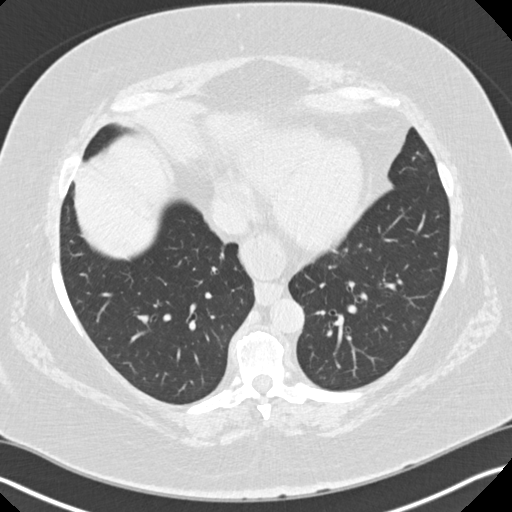
[im 73/157  lung]
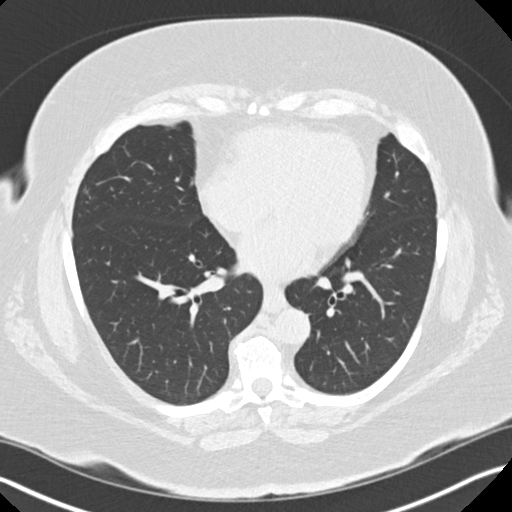
[im 85/157  lung]
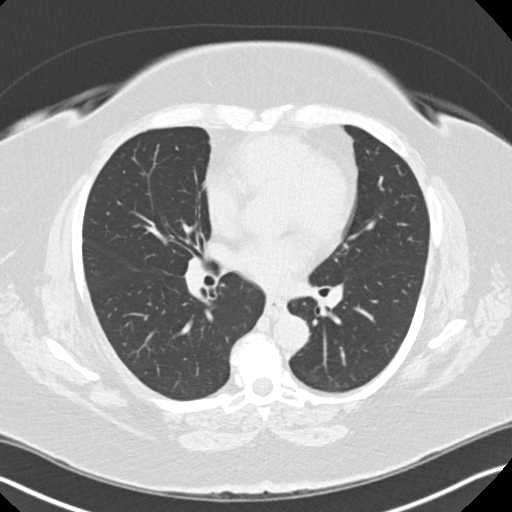
[im 97/157  lung]
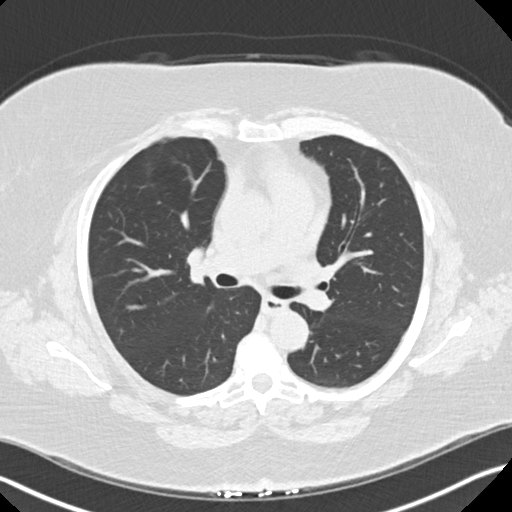
[im 109/157  mediastinal]
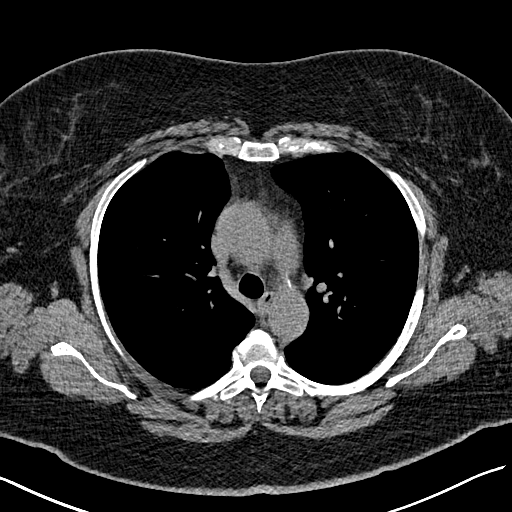
[im 109/157  lung]
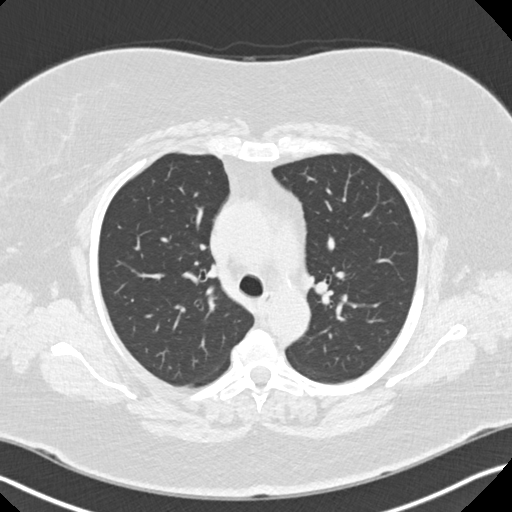
[im 121/157  lung]
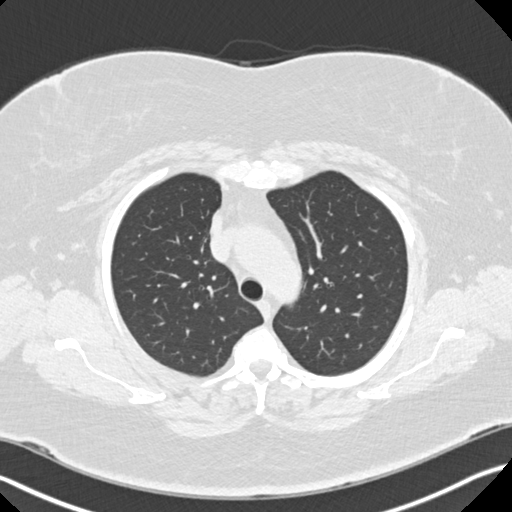
[im 133/157  lung]
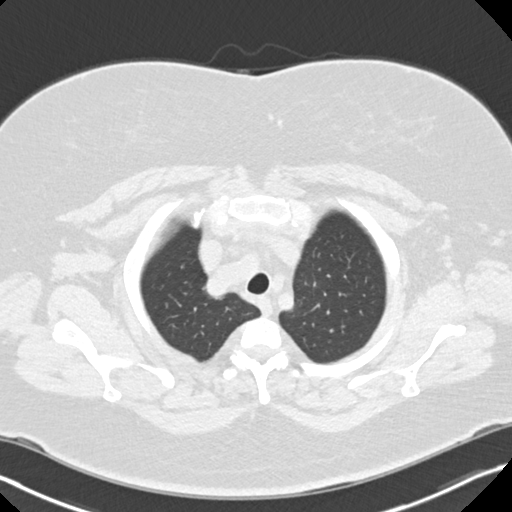
[im 145/157  lung]
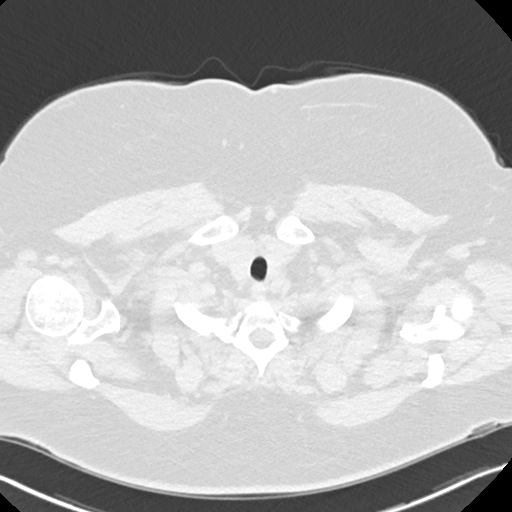

[Series 5: coronals chest 2.00 cor · coronal · 0.62mm/px · 3 of 172 slices shown]
[im 35/172  lung]
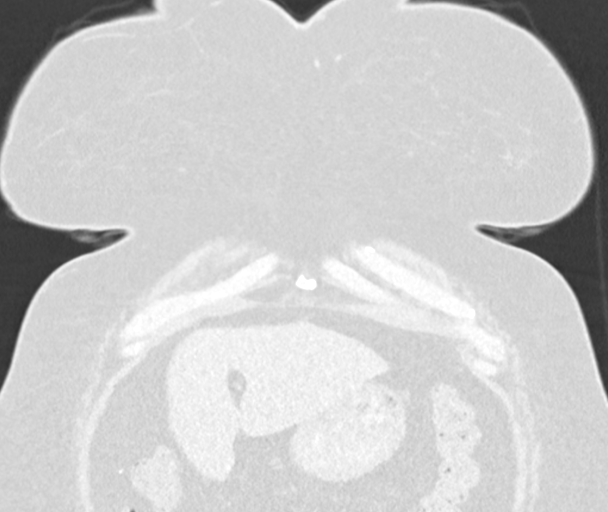
[im 69/172  lung]
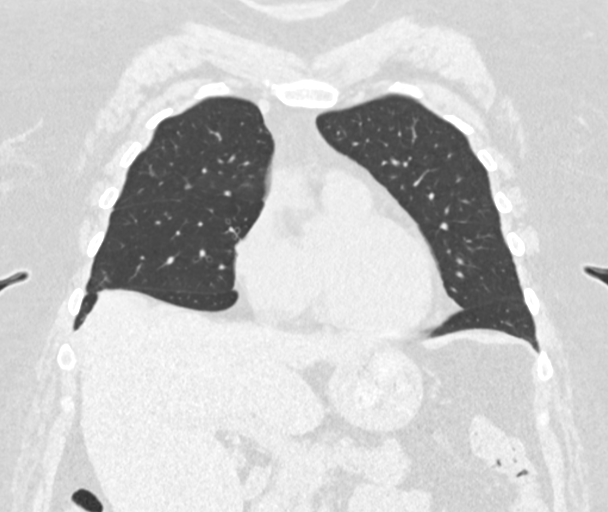
[im 103/172  lung]
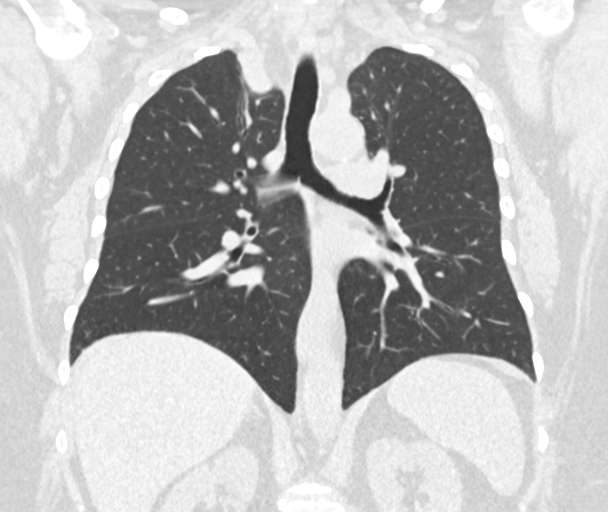

[15 of 36 positions shown; findings below may reference images not displayed]

FINDINGS: Cardiovascular: There is no demonstrable thoracic aortic aneurysm.
Visualized great vessels appear unremarkable. There is no
pericardial effusion or pericardial thickening.

Mediastinum/Nodes: Thyroid appears unremarkable. There is no
appreciable thoracic adenopathy. There is a moderate hiatal hernia.

Lungs/Pleura: There is slight atelectatic change in the anterior
right base. There is no edema or consolidation. No pleural effusions
are evident. There is no appreciable fibrotic type change or lobular
septal thickening. No appreciable bronchiectasis.

Upper Abdomen: Visualized upper abdominal structures appear
unremarkable.

Musculoskeletal: There is degenerative change in the thoracic spine.
There are no blastic or lytic bone lesions. There are no evident
chest wall lesions.
IMPRESSION: 1. Slight anterior right base atelectasis. Lungs elsewhere clear. No
pleural effusions.

2.  No evident adenopathy.

3.  Moderate hiatal hernia.

## 2021-06-01 ENCOUNTER — Other Ambulatory Visit: Payer: Self-pay | Admitting: Family Medicine

## 2021-06-08 DIAGNOSIS — R7401 Elevation of levels of liver transaminase levels: Secondary | ICD-10-CM

## 2021-06-08 HISTORY — DX: Elevation of levels of liver transaminase levels: R74.01

## 2021-06-21 ENCOUNTER — Other Ambulatory Visit: Payer: Self-pay

## 2021-06-21 ENCOUNTER — Ambulatory Visit (INDEPENDENT_AMBULATORY_CARE_PROVIDER_SITE_OTHER): Payer: BC Managed Care – PPO

## 2021-06-21 DIAGNOSIS — E782 Mixed hyperlipidemia: Secondary | ICD-10-CM

## 2021-06-21 DIAGNOSIS — E781 Pure hyperglyceridemia: Secondary | ICD-10-CM

## 2021-06-21 DIAGNOSIS — R7401 Elevation of levels of liver transaminase levels: Secondary | ICD-10-CM

## 2021-06-21 LAB — LIPID PANEL
Cholesterol: 185 mg/dL (ref 0–200)
HDL: 49.2 mg/dL (ref >39.00)
NonHDL: 136.05
Total CHOL/HDL Ratio: 4
Triglycerides: 258 mg/dL — ABNORMAL HIGH (ref 0.0–149.0)
VLDL: 51.6 mg/dL — ABNORMAL HIGH (ref 0.0–40.0)

## 2021-06-21 LAB — AST: AST: 51 U/L — ABNORMAL HIGH (ref 0–37)

## 2021-06-21 LAB — ALT: ALT: 60 U/L — ABNORMAL HIGH (ref 0–35)

## 2021-06-21 LAB — LDL CHOLESTEROL, DIRECT: Direct LDL: 55 mg/dL

## 2021-06-21 NOTE — Progress Notes (Signed)
Pt here for labs only. 

## 2021-06-24 ENCOUNTER — Telehealth: Payer: Self-pay

## 2021-06-24 DIAGNOSIS — R7401 Elevation of levels of liver transaminase levels: Secondary | ICD-10-CM

## 2021-06-24 NOTE — Telephone Encounter (Signed)
-----   Message from Jeoffrey Massed, MD sent at 06/21/2021  6:52 PM EDT ----- Pls add HepB surface antigen, Hep B core antibody IgM, and Hep C antibody. Dx is elevated transaminase.

## 2021-06-24 NOTE — Telephone Encounter (Signed)
-----   Message from Philip H McGowen, MD sent at 06/21/2021  6:52 PM EDT ----- Pls add HepB surface antigen, Hep B core antibody IgM, and Hep C antibody. Dx is elevated transaminase. 

## 2021-06-25 LAB — HEPATITIS C ANTIBODY
Hepatitis C Ab: NONREACTIVE
SIGNAL TO CUT-OFF: 0.01 (ref <1.00)

## 2021-06-25 LAB — HEPATITIS B CORE ANTIBODY, IGM: Hep B C IgM: NONREACTIVE

## 2021-06-25 LAB — HEPATITIS B SURFACE ANTIGEN: Hepatitis B Surface Ag: NONREACTIVE

## 2021-06-26 ENCOUNTER — Encounter: Payer: Self-pay | Admitting: Family Medicine

## 2021-06-28 ENCOUNTER — Telehealth: Payer: Self-pay

## 2021-06-28 NOTE — Telephone Encounter (Signed)
Spoke with patient regarding results/recommendations.  

## 2021-06-28 NOTE — Telephone Encounter (Signed)
Patient stated she is returning Britt's call.  Please call back [phone]

## 2021-06-28 NOTE — Addendum Note (Signed)
Addended by: Paschal Dopp on: 06/28/2021 04:04 PM   Modules accepted: Orders

## 2021-07-26 ENCOUNTER — Other Ambulatory Visit: Payer: Self-pay

## 2021-07-26 ENCOUNTER — Ambulatory Visit: Payer: BC Managed Care – PPO

## 2021-07-26 ENCOUNTER — Other Ambulatory Visit: Payer: Self-pay | Admitting: Family Medicine

## 2021-07-26 ENCOUNTER — Ambulatory Visit (INDEPENDENT_AMBULATORY_CARE_PROVIDER_SITE_OTHER): Payer: BC Managed Care – PPO

## 2021-07-26 DIAGNOSIS — E781 Pure hyperglyceridemia: Secondary | ICD-10-CM | POA: Diagnosis not present

## 2021-07-26 DIAGNOSIS — R7401 Elevation of levels of liver transaminase levels: Secondary | ICD-10-CM

## 2021-07-26 DIAGNOSIS — Z23 Encounter for immunization: Secondary | ICD-10-CM

## 2021-07-26 LAB — HEPATIC FUNCTION PANEL
ALT: 37 U/L — ABNORMAL HIGH (ref 0–35)
AST: 28 U/L (ref 0–37)
Albumin: 4.4 g/dL (ref 3.5–5.2)
Alkaline Phosphatase: 38 U/L — ABNORMAL LOW (ref 39–117)
Bilirubin, Direct: 0.1 mg/dL (ref 0.0–0.3)
Total Bilirubin: 0.6 mg/dL (ref 0.2–1.2)
Total Protein: 6.7 g/dL (ref 6.0–8.3)

## 2021-07-26 LAB — LIPID PANEL
Cholesterol: 170 mg/dL (ref 0–200)
HDL: 50.2 mg/dL (ref >39.00)
NonHDL: 119.31
Total CHOL/HDL Ratio: 3
Triglycerides: 244 mg/dL — ABNORMAL HIGH (ref 0.0–149.0)
VLDL: 48.8 mg/dL — ABNORMAL HIGH (ref 0.0–40.0)

## 2021-07-26 LAB — LDL CHOLESTEROL, DIRECT: Direct LDL: 49 mg/dL

## 2021-08-05 ENCOUNTER — Other Ambulatory Visit: Payer: Self-pay | Admitting: Family Medicine

## 2021-08-05 DIAGNOSIS — Z1231 Encounter for screening mammogram for malignant neoplasm of breast: Secondary | ICD-10-CM

## 2021-08-06 DIAGNOSIS — M25561 Pain in right knee: Secondary | ICD-10-CM | POA: Diagnosis not present

## 2021-08-16 DIAGNOSIS — M25561 Pain in right knee: Secondary | ICD-10-CM | POA: Diagnosis not present

## 2021-08-26 DIAGNOSIS — M25561 Pain in right knee: Secondary | ICD-10-CM | POA: Diagnosis not present

## 2021-08-26 DIAGNOSIS — M5451 Vertebrogenic low back pain: Secondary | ICD-10-CM | POA: Diagnosis not present

## 2021-08-26 DIAGNOSIS — M545 Low back pain, unspecified: Secondary | ICD-10-CM | POA: Insufficient documentation

## 2021-09-03 ENCOUNTER — Encounter: Payer: Self-pay | Admitting: Family Medicine

## 2021-09-04 ENCOUNTER — Other Ambulatory Visit: Payer: Self-pay | Admitting: Family Medicine

## 2021-09-08 DIAGNOSIS — M75100 Unspecified rotator cuff tear or rupture of unspecified shoulder, not specified as traumatic: Secondary | ICD-10-CM

## 2021-09-08 HISTORY — DX: Unspecified rotator cuff tear or rupture of unspecified shoulder, not specified as traumatic: M75.100

## 2021-09-16 ENCOUNTER — Encounter: Payer: Self-pay | Admitting: Family Medicine

## 2021-09-16 DIAGNOSIS — M353 Polymyalgia rheumatica: Secondary | ICD-10-CM

## 2021-09-17 MED ORDER — PREGABALIN 150 MG PO CAPS: ORAL_CAPSULE | ORAL | 0 refills | Status: DC

## 2021-09-17 NOTE — Telephone Encounter (Signed)
OK, pls do prior auth. I just sent in new rx for NEW DOSING and designated on the rx that brand name is medically necessary. Tell pt that I want her to now take the 150mg  lyrica THREE TIMES per day. I'll also order referral to rheumatology but tell pt it may be a couple months before she gets appt. Keep f/u with me planned for next month. -thx

## 2021-09-24 DIAGNOSIS — I10 Essential (primary) hypertension: Secondary | ICD-10-CM | POA: Diagnosis not present

## 2021-09-24 DIAGNOSIS — E876 Hypokalemia: Secondary | ICD-10-CM | POA: Diagnosis not present

## 2021-09-24 DIAGNOSIS — Z888 Allergy status to other drugs, medicaments and biological substances status: Secondary | ICD-10-CM | POA: Diagnosis not present

## 2021-09-24 DIAGNOSIS — Z882 Allergy status to sulfonamides status: Secondary | ICD-10-CM | POA: Diagnosis not present

## 2021-09-24 DIAGNOSIS — E785 Hyperlipidemia, unspecified: Secondary | ICD-10-CM | POA: Diagnosis not present

## 2021-09-24 DIAGNOSIS — R002 Palpitations: Secondary | ICD-10-CM | POA: Diagnosis not present

## 2021-09-24 NOTE — Telephone Encounter (Signed)
Nothing further needed 

## 2021-09-26 ENCOUNTER — Other Ambulatory Visit: Payer: Self-pay | Admitting: Family Medicine

## 2021-09-26 ENCOUNTER — Ambulatory Visit: Payer: BC Managed Care – PPO | Admitting: Family Medicine

## 2021-09-26 DIAGNOSIS — M5416 Radiculopathy, lumbar region: Secondary | ICD-10-CM | POA: Diagnosis not present

## 2021-09-26 DIAGNOSIS — M5451 Vertebrogenic low back pain: Secondary | ICD-10-CM | POA: Diagnosis not present

## 2021-09-27 ENCOUNTER — Other Ambulatory Visit: Payer: Self-pay

## 2021-09-27 NOTE — Telephone Encounter (Signed)
Pt has upcoming appt 1/23

## 2021-09-30 ENCOUNTER — Ambulatory Visit: Payer: BC Managed Care – PPO | Admitting: Family Medicine

## 2021-09-30 ENCOUNTER — Other Ambulatory Visit: Payer: Self-pay

## 2021-09-30 ENCOUNTER — Encounter: Payer: Self-pay | Admitting: Family Medicine

## 2021-09-30 VITALS — BP 107/62 | HR 62 | Temp 97.6°F | Ht 63.0 in | Wt 327.4 lb

## 2021-09-30 DIAGNOSIS — M797 Fibromyalgia: Secondary | ICD-10-CM

## 2021-09-30 DIAGNOSIS — R42 Dizziness and giddiness: Secondary | ICD-10-CM | POA: Diagnosis not present

## 2021-09-30 DIAGNOSIS — E876 Hypokalemia: Secondary | ICD-10-CM

## 2021-09-30 DIAGNOSIS — R002 Palpitations: Secondary | ICD-10-CM | POA: Diagnosis not present

## 2021-09-30 LAB — BASIC METABOLIC PANEL
BUN: 19 mg/dL (ref 6–23)
CO2: 29 mEq/L (ref 19–32)
Calcium: 9.6 mg/dL (ref 8.4–10.5)
Chloride: 105 mEq/L (ref 96–112)
Creatinine, Ser: 0.97 mg/dL (ref 0.40–1.20)
GFR: 63.87 mL/min (ref >60.00)
Glucose, Bld: 118 mg/dL — ABNORMAL HIGH (ref 70–99)
Potassium: 3.4 mEq/L — ABNORMAL LOW (ref 3.5–5.1)
Sodium: 143 mEq/L (ref 135–145)

## 2021-09-30 NOTE — Progress Notes (Addendum)
OFFICE VISIT  09/30/2021  CC:  Chief Complaint  Patient presents with   Follow-up    ED; not fasting   HPI:    Patient is a 61 y.o. female who presents for emergency room follow up from 09/24/21. I last saw her about 5 mo ago. A/P as of last visit: "1) Low bp, treated with 1/2 25mg  tab toprol xl qd and hctz qd-->stop toprol xl and monitor bp and call if not rising over 110/60. Lytes/cr today.   2) Fibromyalgia, fatigue.  Recent R sciatica and LL nocturnal muscle cramps complicating this situation.  Hx of borderline B12 level in the past.   Checking labs today including magnesium and b12 levels. Lyrica brand name rx'd today b/c she says it helped better and only had to take qd.   3) Hypoth: Takes T4 on empty stomach w/out any other meds. TSH today.   4)Health maintenance exam: Reviewed age and gender appropriate health maintenance issues (prudent diet, regular exercise, health risks of tobacco and excessive alcohol, use of seatbelts, fire alarms in home, use of sunscreen).  Also reviewed age and gender appropriate health screening as well as vaccine recommendations. Vaccines: Tdap due-->given today.  Otherwise ALL UTD. Labs: fasting HP + vit D level (vit D def). Cervical ca screening: no further screening indicated b/c hx of TAH for nonmalignant dx. Breast ca screening: due for mammogram->ordered today. Colon ca screening: recall 2024."  INTERIM HX: As of about 2 weeks ago she has been on increased dose of Lyrica--150 twice a day.  She had called stating her fibromyalgia pains were worse and she also requested rheumatology referral at that time, which I ordered.  Status of this is-->150mg  nightly.  She went to the [facility] ED on 09/24/2021 for palpitations.  Home pulse ox was reading anywhere from 70s up to 160s per patient. In the ED her heart rate was around 70 and she was asymptomatic.  Work-up showed a normal CBC.  Metabolic panel normal except for potassium 3.2.  Troponins  negative.  EKG tracing not available in record but the reading on her encounter note stated"SINUS RHYTHM WITH PREMATURE ATRIAL BEATS WITH ABERRANT CONDUCTION OTHERWISE NORMAL ECG". She was discharged home and Holter monitor and cardiology referral recommended.  Patient has experienced odd sensations (increased temperature, numbness in limbs right before her palpitations begin. During her events, she feels she gets lightheaded. Nothing makes the palpitations worse. Symptoms are alleviated only with rest. The episodes range about an hour or so. Since discharge, her last episode was Thursday January 19th. Patient denies losing consciousness or chest pain; however, she mentioned she sometimes feels anxiety.  Past Medical History:  Diagnosis Date   Allergy    Asthma    Chronic low back pain    Elevated transaminase level 06/2021   very mild, onset after starting fenofibrate. Staying on fenofibrate, getting RUQ abd u/s, and repeating labs 1 mo. Viral hep serologies neg.   Fibromyalgia 2017   Herniated disc L4-L5   High triglycerides    mild.  TLC.   Hypertension    Hypothyroidism    IBS (irritable bowel syndrome)    Internal hemorrhoids    Neuropathic pain    Rx'd neurontin by her orthopedist   Osteoarthritis of left knee 01/2021   [facility].  Also R knee osteo + med menisc tear (2022)   Palpitations 09/2018   toprol xl started by cardiologist->no tachyarrhythmia on telemetry   Sprain of posterior cruciate ligament of knee right knee  Vitamin D deficiency 02/2017   high dose vit D started 02/23/17--vit D still low on recheck.  Needs 50K twice per week dosing indefinitely. Vit D level mid 40s Nov 2019.    Past Surgical History:  Procedure Laterality Date   CARDIOVASCULAR STRESS TEST  09/19/2018   Low risk   COLONOSCOPY W/ POLYPECTOMY  09/2012   Dr. Loreta Ave: diverticulosis.  Recall 2024.   TOTAL ABDOMINAL HYSTERECTOMY W/ BILATERAL SALPINGOOPHORECTOMY  approx 2006   Nonmalignant reasons    TRANSTHORACIC ECHOCARDIOGRAM  09/19/2018   EF 60-65%, mild LVH, Grd I DD.    Outpatient Medications Prior to Visit  Medication Sig Dispense Refill   Cyanocobalamin (VITAMIN B-12 PO) Take by mouth daily.     DULoxetine (CYMBALTA) 60 MG capsule TAKE 1 CAPSULE BY MOUTH EVERY DAY 90 capsule 3   fenofibrate 160 MG tablet TAKE 1 TABLET BY MOUTH EVERY DAY 90 tablet 0   hydrochlorothiazide (HYDRODIURIL) 25 MG tablet TAKE 1 TABLET BY MOUTH EVERY DAY 90 tablet 3   levothyroxine (SYNTHROID) 200 MCG tablet Take 1 tablet (200 mcg total) by mouth daily before breakfast. 90 tablet 3   metoprolol succinate (TOPROL-XL) 25 MG 24 hr tablet Take 12.5 mg by mouth daily.     Misc Natural Products (CORTISOL MANAGER PO) Take by mouth daily.     OVER THE COUNTER MEDICATION daily. Cytokine Suppress     OVER THE COUNTER MEDICATION daily. Curcumin Elite     pregabalin (LYRICA) 150 MG capsule 1 cap po tid 90 capsule 0   Vitamin D, Ergocalciferol, (DRISDOL) 1.25 MG (50000 UNIT) CAPS capsule TAKE 1 CAPSULE BY MOUTH TWICE WEEKLY 24 capsule 2   No facility-administered medications prior to visit.    Allergies  Allergen Reactions   Aspirin Shortness Of Breath and Other (See Comments)    nasal congestion   Nsaids Anaphylaxis   Sulfa Antibiotics Rash    ROS As per HPI  PE: Vitals with BMI 09/30/2021 04/11/2021 10/11/2020  Height 5\' 3"  5\' 3"  5\' 3"   Weight 327 lbs 6 oz 321 lbs 10 oz 313 lbs 6 oz  BMI 58.01 56.98 55.53  Systolic 107 90 135  Diastolic 62 50 83  Pulse 62 63 59     Physical Exam Constitutional:      Appearance: Normal appearance.  Cardiovascular:     Rate and Rhythm: Normal rate and regular rhythm.     Heart sounds: Normal heart sounds.  Pulmonary:     Effort: Pulmonary effort is normal.     Breath sounds: Normal breath sounds.  Neurological:     Mental Status: She is alert.  Psychiatric:        Mood and Affect: Mood normal.        Behavior: Behavior normal.     LABS:  Last  metabolic panel Lab Results  Component Value Date   GLUCOSE 104 (H) 04/11/2021   NA 141 04/11/2021   K 4.0 04/11/2021   CL 103 04/11/2021   CO2 30 04/11/2021   BUN 12 04/11/2021   CREATININE 0.77 04/11/2021   GFRNONAA >60 09/19/2018   CALCIUM 9.5 04/11/2021   PROT 6.7 07/26/2021   ALBUMIN 4.4 07/26/2021   BILITOT 0.6 07/26/2021   ALKPHOS 38 (L) 07/26/2021   AST 28 07/26/2021   ALT 37 (H) 07/26/2021   ANIONGAP 8 09/19/2018      IMPRESSION AND PLAN:  Non-toxic , stable appearing female who presents for ED follow up.  1) Low bp in pt with  HTN, treated with 1/2 25mg  tab toprol xl qd and hctz 25 qd. Will decrease hctz to 12.5 mg daily. Lytes/cr today.   2) Fibromyalgia, fatigue. Uncontrolled. Patient is currently taking Lyrica 150mg  nightly and she is slowly titrating up her dosage. She continues to tolerate the medication well.  Has rheum MD initial visit set for next month.  3) Hypothyroidism, controlled (last TSH- 4.597 09/24/21). Continue to take T4  4) ED visit. Patients symptoms (hot flashes, numbness) indicate prodromes beginning right before feeling light headed raises suspicion for vasovagal syncope. Medical hx of abnormal heart rate and rhythm further complicates the picture. In the past, patient has worn ambulatory heart monitor, results not available and were not clear to pt but metop started after (?). She will follow up with new cardiologist this Thursday (Dr. 09/26/21, 10/03/21) for possibility of ambulatory monitoring again.   An After Visit Summary was printed and given to the patient.  FOLLOW UP: Return for reschedule 2/6 appt for 6-8 wks from now.  Andrey Farmer - MS3

## 2021-09-30 NOTE — Patient Instructions (Signed)
Cut your hydrochlorothiazide dose back to 1/2 of 25mg  tab daily.

## 2021-09-30 NOTE — Progress Notes (Signed)
See student note for this encounter. I have attested it. Signed:  Santiago Bumpers, MD           09/30/2021

## 2021-10-03 ENCOUNTER — Ambulatory Visit
Admission: RE | Admit: 2021-10-03 | Discharge: 2021-10-03 | Disposition: A | Discharge status: Home / Self Care | Payer: BC Managed Care – PPO | Source: Ambulatory Visit | Attending: Family Medicine | Admitting: Family Medicine

## 2021-10-03 ENCOUNTER — Other Ambulatory Visit: Payer: Self-pay

## 2021-10-03 DIAGNOSIS — Z1231 Encounter for screening mammogram for malignant neoplasm of breast: Secondary | ICD-10-CM

## 2021-10-03 DIAGNOSIS — R002 Palpitations: Secondary | ICD-10-CM | POA: Diagnosis not present

## 2021-10-03 DIAGNOSIS — Z6841 Body Mass Index (BMI) 40.0 and over, adult: Secondary | ICD-10-CM | POA: Diagnosis not present

## 2021-10-04 DIAGNOSIS — M25512 Pain in left shoulder: Secondary | ICD-10-CM | POA: Diagnosis not present

## 2021-10-05 DIAGNOSIS — R002 Palpitations: Secondary | ICD-10-CM | POA: Diagnosis not present

## 2021-10-11 DIAGNOSIS — R002 Palpitations: Secondary | ICD-10-CM | POA: Diagnosis not present

## 2021-10-11 DIAGNOSIS — R0602 Shortness of breath: Secondary | ICD-10-CM | POA: Diagnosis not present

## 2021-10-14 ENCOUNTER — Ambulatory Visit: Payer: BC Managed Care – PPO | Admitting: Family Medicine

## 2021-10-14 DIAGNOSIS — M5459 Other low back pain: Secondary | ICD-10-CM | POA: Diagnosis not present

## 2021-10-14 DIAGNOSIS — M546 Pain in thoracic spine: Secondary | ICD-10-CM | POA: Diagnosis not present

## 2021-10-16 DIAGNOSIS — R519 Headache, unspecified: Secondary | ICD-10-CM | POA: Diagnosis not present

## 2021-10-16 DIAGNOSIS — W1830XA Fall on same level, unspecified, initial encounter: Secondary | ICD-10-CM | POA: Diagnosis not present

## 2021-10-16 DIAGNOSIS — M1712 Unilateral primary osteoarthritis, left knee: Secondary | ICD-10-CM | POA: Diagnosis not present

## 2021-10-16 DIAGNOSIS — M25561 Pain in right knee: Secondary | ICD-10-CM | POA: Diagnosis not present

## 2021-10-16 DIAGNOSIS — M170 Bilateral primary osteoarthritis of knee: Secondary | ICD-10-CM | POA: Diagnosis not present

## 2021-10-16 DIAGNOSIS — S8001XA Contusion of right knee, initial encounter: Secondary | ICD-10-CM | POA: Diagnosis not present

## 2021-10-16 DIAGNOSIS — E785 Hyperlipidemia, unspecified: Secondary | ICD-10-CM | POA: Diagnosis not present

## 2021-10-16 DIAGNOSIS — I10 Essential (primary) hypertension: Secondary | ICD-10-CM | POA: Diagnosis not present

## 2021-10-16 DIAGNOSIS — M25562 Pain in left knee: Secondary | ICD-10-CM | POA: Diagnosis not present

## 2021-10-16 DIAGNOSIS — M25512 Pain in left shoulder: Secondary | ICD-10-CM | POA: Diagnosis not present

## 2021-10-16 DIAGNOSIS — M797 Fibromyalgia: Secondary | ICD-10-CM | POA: Diagnosis not present

## 2021-10-16 DIAGNOSIS — M778 Other enthesopathies, not elsewhere classified: Secondary | ICD-10-CM | POA: Diagnosis not present

## 2021-10-16 DIAGNOSIS — S8000XA Contusion of unspecified knee, initial encounter: Secondary | ICD-10-CM | POA: Diagnosis not present

## 2021-10-16 DIAGNOSIS — S8002XA Contusion of left knee, initial encounter: Secondary | ICD-10-CM | POA: Diagnosis not present

## 2021-10-16 DIAGNOSIS — S4992XA Unspecified injury of left shoulder and upper arm, initial encounter: Secondary | ICD-10-CM | POA: Diagnosis not present

## 2021-10-17 DIAGNOSIS — M25562 Pain in left knee: Secondary | ICD-10-CM | POA: Diagnosis not present

## 2021-10-17 DIAGNOSIS — M170 Bilateral primary osteoarthritis of knee: Secondary | ICD-10-CM | POA: Diagnosis not present

## 2021-10-17 DIAGNOSIS — M25561 Pain in right knee: Secondary | ICD-10-CM | POA: Diagnosis not present

## 2021-10-22 DIAGNOSIS — M25512 Pain in left shoulder: Secondary | ICD-10-CM | POA: Diagnosis not present

## 2021-10-25 ENCOUNTER — Encounter: Payer: Self-pay | Admitting: Family Medicine

## 2021-10-30 DIAGNOSIS — M25512 Pain in left shoulder: Secondary | ICD-10-CM | POA: Diagnosis not present

## 2021-10-31 DIAGNOSIS — I471 Supraventricular tachycardia: Secondary | ICD-10-CM | POA: Diagnosis not present

## 2021-10-31 DIAGNOSIS — Z6841 Body Mass Index (BMI) 40.0 and over, adult: Secondary | ICD-10-CM | POA: Diagnosis not present

## 2021-10-31 DIAGNOSIS — R002 Palpitations: Secondary | ICD-10-CM | POA: Diagnosis not present

## 2021-11-04 DIAGNOSIS — M25512 Pain in left shoulder: Secondary | ICD-10-CM | POA: Diagnosis not present

## 2021-11-06 DIAGNOSIS — M5451 Vertebrogenic low back pain: Secondary | ICD-10-CM | POA: Diagnosis not present

## 2021-11-11 ENCOUNTER — Encounter: Payer: Self-pay | Admitting: Family Medicine

## 2021-11-11 ENCOUNTER — Other Ambulatory Visit: Payer: Self-pay | Admitting: Family Medicine

## 2021-11-11 ENCOUNTER — Ambulatory Visit: Payer: BC Managed Care – PPO | Admitting: Family Medicine

## 2021-11-11 NOTE — Progress Notes (Deleted)
OFFICE VISIT ? ?11/11/2021 ? ?CC: No chief complaint on file. ? ? ?HPI:   ? ?Patient is a 61 y.o. female who presents for 6 wk f/u fibromyalgia and HTN. ?A/P as of last visit: ?"1) Low bp in pt with HTN, treated with 1/2 25mg  tab toprol xl qd and hctz 25 qd. Will decrease hctz to 12.5 mg daily. Lytes/cr today. ?  ?2) Fibromyalgia, fatigue. Uncontrolled. Patient is currently taking Lyrica 150mg  nightly and she is slowly titrating up her dosage. She continues to tolerate the medication well.  ?Has rheum MD initial visit set for next month. ?  ?3) Hypothyroidism, controlled (last TSH- 4.597 09/24/21). Continue to take T4 ?  ?4) ED visit. Patients symptoms (hot flashes, numbness) indicate prodromes beginning right before feeling light headed raises suspicion for vasovagal syncope. Medical hx of abnormal heart rate and rhythm further complicates the picture. In the past, patient has worn ambulatory heart monitor, results not available and were not clear to pt but metop started after (?). ?She will follow up with new cardiologist this Thursday (Dr. 09/26/21, 10/03/21) for possibility of ambulatory monitoring again. " ? ?INTERIM HX: ?*** ? ? ? ?Past Medical History:  ?Diagnosis Date  ? Allergy   ? Asthma   ? Chronic low back pain   ? DDD, DJD lumbar multilevel  ? Elevated transaminase level 06/2021  ? very mild, onset after starting fenofibrate. Staying on fenofibrate, getting RUQ abd u/s, and repeating labs 1 mo. Viral hep serologies neg.  ? Fibromyalgia 2017  ? Herniated disc L4-L5  ? High triglycerides   ? mild.  TLC.  ? Hypertension   ? Hypothyroidism   ? IBS (irritable bowel syndrome)   ? Internal hemorrhoids   ? Neuropathic pain   ? Rx'd neurontin by her orthopedist  ? Osteoarthritis of left knee 01/2021  ? [facility].  Also R knee osteo + med menisc tear (2022).  R knee visco started 10/2021 ([facility])  ? Palpitations 09/2018  ? toprol xl started by cardiologist->no tachyarrhythmia on telemetry  ? Sprain of posterior  cruciate ligament of knee right knee  ? Vitamin D deficiency 02/2017  ? high dose vit D started 02/23/17--vit D still low on recheck.  Needs 50K twice per week dosing indefinitely. Vit D level mid 40s Nov 2019.  ? ? ?Past Surgical History:  ?Procedure Laterality Date  ? CARDIOVASCULAR STRESS TEST  09/19/2018  ? Low risk  ? COLONOSCOPY W/ POLYPECTOMY  09/2012  ? Dr. 11/18/2018: diverticulosis.  Recall 2024.  ? TOTAL ABDOMINAL HYSTERECTOMY W/ BILATERAL SALPINGOOPHORECTOMY  approx 2006  ? Nonmalignant reasons  ? TRANSTHORACIC ECHOCARDIOGRAM  09/19/2018  ? EF 60-65%, mild LVH, Grd I DD.  ? ? ?Outpatient Medications Prior to Visit  ?Medication Sig Dispense Refill  ? fenofibrate 160 MG tablet TAKE 1 TABLET BY MOUTH EVERY DAY 90 tablet 3  ? hydrochlorothiazide (HYDRODIURIL) 25 MG tablet Take 0.5 tablets (12.5 mg total) by mouth daily. 45 tablet 3  ? Cyanocobalamin (VITAMIN B-12 PO) Take by mouth daily.    ? DULoxetine (CYMBALTA) 60 MG capsule TAKE 1 CAPSULE BY MOUTH EVERY DAY 90 capsule 3  ? levothyroxine (SYNTHROID) 200 MCG tablet Take 1 tablet (200 mcg total) by mouth daily before breakfast. 90 tablet 3  ? metoprolol succinate (TOPROL-XL) 25 MG 24 hr tablet Take 12.5 mg by mouth daily.    ? Misc Natural Products (CORTISOL MANAGER PO) Take by mouth daily.    ? OVER THE COUNTER MEDICATION daily. Cytokine Suppress    ?  OVER THE COUNTER MEDICATION daily. Curcumin Elite    ? pregabalin (LYRICA) 150 MG capsule 1 cap po tid 90 capsule 0  ? Vitamin D, Ergocalciferol, (DRISDOL) 1.25 MG (50000 UNIT) CAPS capsule TAKE 1 CAPSULE BY MOUTH TWICE WEEKLY 24 capsule 2  ? ?No facility-administered medications prior to visit.  ? ? ?Allergies  ?Allergen Reactions  ? Aspirin Shortness Of Breath and Other (See Comments)  ?  nasal congestion  ? Nsaids Anaphylaxis  ? Sulfa Antibiotics Rash  ? ? ?ROS ?As per HPI ? ?PE: ?Vitals with BMI 09/30/2021 04/11/2021 10/11/2020  ?Height 5\' 3"  5\' 3"  5\' 3"   ?Weight 327 lbs 6 oz 321 lbs 10 oz 313 lbs 6 oz  ?BMI 58.01  56.98 55.53  ?Systolic 107 90 135  ?Diastolic 62 50 83  ?Pulse 62 63 59  ? ? ? ?Physical Exam ? ?*** ? ?LABS:  ?Last CBC ?Lab Results  ?Component Value Date  ? WBC 5.5 04/11/2021  ? HGB 13.4 04/11/2021  ? HCT 40.3 04/11/2021  ? MCV 85.2 04/11/2021  ? MCH 27.2 09/19/2018  ? RDW 14.6 04/11/2021  ? PLT 280.0 04/11/2021  ? ?Last metabolic panel ?Lab Results  ?Component Value Date  ? GLUCOSE 118 (H) 09/30/2021  ? NA 143 09/30/2021  ? K 3.4 (L) 09/30/2021  ? CL 105 09/30/2021  ? CO2 29 09/30/2021  ? BUN 19 09/30/2021  ? CREATININE 0.97 09/30/2021  ? GFRNONAA >60 09/19/2018  ? CALCIUM 9.6 09/30/2021  ? PROT 6.7 07/26/2021  ? ALBUMIN 4.4 07/26/2021  ? BILITOT 0.6 07/26/2021  ? ALKPHOS 38 (L) 07/26/2021  ? AST 28 07/26/2021  ? ALT 37 (H) 07/26/2021  ? ANIONGAP 8 09/19/2018  ? ?Last lipids ?Lab Results  ?Component Value Date  ? CHOL 170 07/26/2021  ? HDL 50.20 07/26/2021  ? LDLCALC 76 10/05/2013  ? LDLDIRECT 49.0 07/26/2021  ? TRIG 244.0 (H) 07/26/2021  ? CHOLHDL 3 07/26/2021  ? ?Last hemoglobin A1c ?Lab Results  ?Component Value Date  ? HGBA1C 5.3 09/19/2018  ? ?Last thyroid functions ?Lab Results  ?Component Value Date  ? TSH 0.32 (L) 04/11/2021  ? T3TOTAL 84 01/01/2018  ? ?Lab Results  ?Component Value Date  ? HGBA1C 5.3 09/19/2018  ? ?IMPRESSION AND PLAN: ? ?No problem-specific Assessment & Plan notes found for this encounter. ? ? ?An After Visit Summary was printed and given to the patient. ? ?FOLLOW UP: No follow-ups on file. ? ?Signed:  06/11/2021, MD           11/11/2021 ? ?

## 2021-11-12 NOTE — Telephone Encounter (Signed)
Pt has appt for 3/8 rescheduled from 3/6. ?

## 2021-11-13 ENCOUNTER — Encounter: Payer: Self-pay | Admitting: Family Medicine

## 2021-11-13 ENCOUNTER — Other Ambulatory Visit: Payer: Self-pay

## 2021-11-13 ENCOUNTER — Ambulatory Visit: Payer: BC Managed Care – PPO | Admitting: Family Medicine

## 2021-11-13 VITALS — BP 98/65 | HR 51 | Temp 97.6°F | Ht 63.0 in | Wt 309.8 lb

## 2021-11-13 DIAGNOSIS — I10 Essential (primary) hypertension: Secondary | ICD-10-CM

## 2021-11-13 DIAGNOSIS — E876 Hypokalemia: Secondary | ICD-10-CM

## 2021-11-13 DIAGNOSIS — M797 Fibromyalgia: Secondary | ICD-10-CM | POA: Diagnosis not present

## 2021-11-13 DIAGNOSIS — R031 Nonspecific low blood-pressure reading: Secondary | ICD-10-CM | POA: Diagnosis not present

## 2021-11-13 DIAGNOSIS — R002 Palpitations: Secondary | ICD-10-CM

## 2021-11-13 LAB — BASIC METABOLIC PANEL
BUN: 18 mg/dL (ref 6–23)
CO2: 30 mEq/L (ref 19–32)
Calcium: 9.6 mg/dL (ref 8.4–10.5)
Chloride: 104 mEq/L (ref 96–112)
Creatinine, Ser: 0.89 mg/dL (ref 0.40–1.20)
GFR: 70.76 mL/min (ref >60.00)
Glucose, Bld: 87 mg/dL (ref 70–99)
Potassium: 3.9 mEq/L (ref 3.5–5.1)
Sodium: 141 mEq/L (ref 135–145)

## 2021-11-13 MED ORDER — PREGABALIN 150 MG PO CAPS: ORAL_CAPSULE | ORAL | 0 refills | Status: DC

## 2021-11-13 NOTE — Progress Notes (Signed)
OFFICE VISIT ? ?11/13/2021 ? ?CC:  ?Chief Complaint  ?Patient presents with  ? Palpitations  ? Fibromyalgia  ? Hypertension  ?  Pt is fasting  ? ?HPI:   ? ?Patient is a 61 y.o. female who presents for 6 week f/u palpitations, HTN, fibromyalgia. ?A/P as of last visit: ?"1) Low bp in pt with HTN, treated with 1/2 25mg  tab toprol xl qd and hctz 25 qd. Will decrease hctz to 12.5 mg daily. Lytes/cr today. ?  ?2) Fibromyalgia, fatigue. Uncontrolled. Patient is currently taking Lyrica 150mg  nightly and she is slowly titrating up her dosage. She continues to tolerate the medication well.  ?Has rheum MD initial visit set for next month. ?  ?3) Hypothyroidism, controlled (last TSH- 4.597 09/24/21). Continue to take T4 ?  ?4) ED visit. Patients symptoms (hot flashes, numbness) indicate prodromes beginning right before feeling light headed raises suspicion for vasovagal syncope. Medical hx of abnormal heart rate and rhythm further complicates the picture. In the past, patient has worn ambulatory heart monitor, results not available and were not clear to pt but metop started after (?). ?She will follow up with new cardiologist this Thursday (Dr. Andrey Farmerossi, 10/03/21) for possibility of ambulatory monitoring again. " ? ?INTERIM HX: ?Overall she is doing okay but has a lot going on. ?She sustained a shoulder injury from a fall.  Fracture of greater tuberosity of the humerus as well as rotator cuff tear.  She is going to get surgery. ?Has chronic bilateral knee pain and low back pain, both of which are being addressed by specialist. ?Hyaluronic acid injections to the knees is the plan, low back going to be addressed again with an MRI after she does a round of aqua therapy. ? ?Her cardiologist did Zio patch monitoring on 10/03/2021, results unremarkable. ?Echocardiogram was done 10/11/2021, technically difficult due to body habitus, estimated ejection fraction greater than 55%, essentially normal function and valves. ? ?Fibromyalgia pain is  helped somewhat by Lyrica 150 mg at bedtime.  This allows the pain to be relieved enough to not wake her up and she has better sleep through the night.  Occasionally she will take a daytime dose of the Lyrica but this still makes her kind of drowsy. ? ?Not checking home blood pressures. ?She is limited in activity only by her musculoskeletal issues.  No shortness of breath or chest pain. ? ?ROS as above, plus--> no fevers, no CP, no SOB, no wheezing, no cough, no dizziness, no HAs, no rashes, no melena/hematochezia.  No polyuria or polydipsia.   No focal weakness, paresthesias, or tremors.  No acute vision or hearing abnormalities.  No dysuria or unusual/new urinary urgency or frequency.  No recent changes in lower legs. ?No n/v/d or abd pain.  No palpitations.   ? ? ?Past Medical History:  ?Diagnosis Date  ? Allergy   ? Asthma   ? Chronic low back pain   ? DDD, DJD lumbar multilevel  ? Elevated transaminase level 06/2021  ? very mild, onset after starting fenofibrate. Staying on fenofibrate, getting RUQ abd u/s, and repeating labs 1 mo. Viral hep serologies neg.  ? Fibromyalgia 2017  ? Herniated disc L4-L5  ? High triglycerides   ? mild.  TLC.  ? Hypertension   ? Hypothyroidism   ? IBS (irritable bowel syndrome)   ? Internal hemorrhoids   ? Neuropathic pain   ? Rx'd neurontin by her orthopedist  ? Osteoarthritis of left knee 01/2021  ? [facility].  Also R knee  osteo + med menisc tear (2022).  R knee visco started 10/2021 ([facility])  ? Palpitations 09/2018  ? toprol xl started by cardiologist->no tachyarrhythmia on telemetry  ? Rotator cuff tear 2023  ? Sprain of posterior cruciate ligament of knee right knee  ? Vitamin D deficiency 02/2017  ? high dose vit D started 02/23/17--vit D still low on recheck.  Needs 50K twice per week dosing indefinitely. Vit D level mid 40s Nov 2019.  ? ? ?Past Surgical History:  ?Procedure Laterality Date  ? CARDIOVASCULAR STRESS TEST  09/19/2018  ? Low risk  ? COLONOSCOPY W/  POLYPECTOMY  09/2012  ? Dr. Loreta Ave: diverticulosis.  Recall 2024.  ? TOTAL ABDOMINAL HYSTERECTOMY W/ BILATERAL SALPINGOOPHORECTOMY  approx 2006  ? Nonmalignant reasons  ? TRANSTHORACIC ECHOCARDIOGRAM  09/19/2018  ? EF 60-65%, mild LVH, Grd I DD.  ? ? ?Outpatient Medications Prior to Visit  ?Medication Sig Dispense Refill  ? Coenzyme Q10 (COQ10 PO) Take 1 tablet by mouth daily.    ? Cyanocobalamin (VITAMIN B-12 PO) Take by mouth daily.    ? DULoxetine (CYMBALTA) 60 MG capsule TAKE 1 CAPSULE BY MOUTH EVERY DAY 90 capsule 3  ? fenofibrate 160 MG tablet TAKE 1 TABLET BY MOUTH EVERY DAY 90 tablet 3  ? hydrochlorothiazide (HYDRODIURIL) 25 MG tablet Take 0.5 tablets (12.5 mg total) by mouth daily. 45 tablet 3  ? levothyroxine (SYNTHROID) 200 MCG tablet Take 1 tablet (200 mcg total) by mouth daily before breakfast. 90 tablet 3  ? lidocaine (LIDODERM) 5 % Place 1 patch onto the skin daily. Remove & Discard patch within 12 hours or as directed by MD    ? metoprolol succinate (TOPROL-XL) 25 MG 24 hr tablet Take 12.5 mg by mouth daily.    ? Misc Natural Products (CORTISOL MANAGER PO) Take by mouth daily.    ? OVER THE COUNTER MEDICATION daily. Cytokine Suppress    ? OVER THE COUNTER MEDICATION daily. Curcumin Elite    ? Vitamin D, Ergocalciferol, (DRISDOL) 1.25 MG (50000 UNIT) CAPS capsule TAKE 1 CAPSULE BY MOUTH TWICE WEEKLY 24 capsule 2  ? pregabalin (LYRICA) 150 MG capsule 1 cap po tid (Patient taking differently: 2 (two) times daily. 1 cap po tid) 90 capsule 0  ? ?No facility-administered medications prior to visit.  ? ? ?Allergies  ?Allergen Reactions  ? Aspirin Shortness Of Breath and Other (See Comments)  ?  nasal congestion  ? Nsaids Anaphylaxis  ? Sulfa Antibiotics Rash  ? ? ?ROS ?As per HPI ? ?PE: ?Vitals with BMI 11/13/2021 09/30/2021 04/11/2021  ?Height 5\' 3"  5\' 3"  5\' 3"   ?Weight 309 lbs 13 oz 327 lbs 6 oz 321 lbs 10 oz  ?BMI 54.89 58.01 56.98  ?Systolic 98 107 90  ?Diastolic 65 62 50  ?Pulse 51 62 63  ? ? ? ?Physical  Exam ? ?General: Alert and well-appearing. ?Affect: Lucid thought and speech.  Pleasant. ?Cardiovascular: Regular rhythm, rate 55, no murmur. ?Lungs are clear to auscultation bilaterally, breathing nonlabored. ?Extremities show mild doughy-type edema versus excess subcu adiposity, but no pitting. ? ?LABS:  ?Last CBC ?Lab Results  ?Component Value Date  ? WBC 5.5 04/11/2021  ? HGB 13.4 04/11/2021  ? HCT 40.3 04/11/2021  ? MCV 85.2 04/11/2021  ? MCH 27.2 09/19/2018  ? RDW 14.6 04/11/2021  ? PLT 280.0 04/11/2021  ? ?Last metabolic panel ?Lab Results  ?Component Value Date  ? GLUCOSE 118 (H) 09/30/2021  ? NA 143 09/30/2021  ? K 3.4 (L)  09/30/2021  ? CL 105 09/30/2021  ? CO2 29 09/30/2021  ? BUN 19 09/30/2021  ? CREATININE 0.97 09/30/2021  ? GFRNONAA >60 09/19/2018  ? CALCIUM 9.6 09/30/2021  ? PROT 6.7 07/26/2021  ? ALBUMIN 4.4 07/26/2021  ? BILITOT 0.6 07/26/2021  ? ALKPHOS 38 (L) 07/26/2021  ? AST 28 07/26/2021  ? ALT 37 (H) 07/26/2021  ? ANIONGAP 8 09/19/2018  ? ?Last lipids ?Lab Results  ?Component Value Date  ? CHOL 170 07/26/2021  ? HDL 50.20 07/26/2021  ? LDLCALC 76 10/05/2013  ? LDLDIRECT 49.0 07/26/2021  ? TRIG 244.0 (H) 07/26/2021  ? CHOLHDL 3 07/26/2021  ? ?Last hemoglobin A1c ?Lab Results  ?Component Value Date  ? HGBA1C 5.3 09/19/2018  ? ?Lab Results  ?Component Value Date  ? ANA Negative 05/16/2016  ? ? ?Last thyroid functions ?On 09/24/21 her TSH was 4.6 ? ?IMPRESSION AND PLAN: ? ?#1 hypertension, with low blood pressure readings lately. ?We will discontinue her HCTZ completely. ?Electrolytes and creatinine today.  Continue Toprol-XL 12.5 mg a day. ? ?2.  Fibromyalgia syndrome. ?She is getting some benefit from Lyrica 150 mg, 1-2 every day. ?She is going to get specialist evaluation on 11/18/2021 with Dr. Dimple Casey, rheumatologist ? ?#3 palpitations.  These seem to have calmed down but if they recur her cardiologist Dr. Andrey Farmer is going to do electrophysiology study with the aim of pursuing ablation. ? ? ? ?An  After Visit Summary was printed and given to the patient. ? ?FOLLOW UP: Return in about 3 months (around 02/13/2022) for routine chronic illness f/u. ? ?Signed:  Santiago Bumpers, MD           11/13/2021 ? ?

## 2021-11-17 NOTE — Progress Notes (Signed)
Office Visit Note  Patient: Brenda Schroeder             Date of Birth: 10/27/1960           MRN: [id number]             PCP: Jeoffrey MassedMcGowen, Philip H, MD Referring: Jeoffrey MassedMcGowen, Philip H, MD Visit Date: 11/18/2021   Subjective:   History of Present Illness: Brenda Schroeder is a 61 y.o. female here for evaluation of multiple joint and muscle pains. She has a history of fibromyalgia but also multiple joint problems with osteoarthritis and with right knee meniscal tear on MRI.  Chronic back pain in the thoracic and lumbar spine has been a problem for 30 years with some waxing and waning of symptom intensity over periods of time.  She has been doing reasonably well or at least in stable amount of symptoms till around October of last year.  She attributes this to going on a cruise where she developed shoulder and upper back pain worsening from carrying her luggage around that proceeded to worsen overall back pain to the point she had limited mobility.'s been bothering her more for several months and then worse again after she fell in February landing on her left knee.  She has had extensive work-ups with orthopedics finding a fracture and full-thickness supraspinatus rotator cuff tear in the left shoulder. She was started on low dose prednisone and lyrica without large improvement in symptoms.  She has multiple follow-ups scheduled later this week for her low back pain management and for follow-up for the left shoulder injury.   Activities of Daily Living:  Patient reports morning stiffness for 5-10 minutes.   Patient Reports nocturnal pain.  Difficulty dressing/grooming: Reports Difficulty climbing stairs: Reports Difficulty getting out of chair: Reports Difficulty using hands for taps, buttons, cutlery, and/or writing: Reports  Review of Systems  Constitutional:  Positive for fatigue.  HENT:  Negative for mouth sores, mouth dryness and nose dryness.   Eyes:  Negative for pain, itching and dryness.   Respiratory:  Negative for shortness of breath and difficulty breathing.   Cardiovascular:  Negative for chest pain and palpitations.  Gastrointestinal:  Positive for constipation. Negative for blood in stool and diarrhea.  Endocrine: Negative for increased urination.  Genitourinary:  Negative for difficulty urinating.  Musculoskeletal:  Positive for joint pain, joint pain, myalgias, morning stiffness, muscle tenderness and myalgias. Negative for joint swelling.  Skin:  Negative for color change, rash and redness.  Allergic/Immunologic: Negative for susceptible to infections.  Neurological:  Positive for numbness and weakness. Negative for dizziness, headaches and memory loss.  Hematological:  Negative for bruising/bleeding tendency.  Psychiatric/Behavioral:  Negative for confusion.    PMFS History:  Patient Active Problem List   Diagnosis Date Noted   Pain in left shoulder 11/18/2021   Fibromyalgia syndrome 11/18/2021   Lumbar pain 08/26/2021   Right knee pain 08/06/2021   Elevated transaminase level 06/2021   Chest pain 09/19/2018   Obstructive sleep apnea 08/10/2014   Anxiety state 01/10/2014   Health maintenance examination 10/05/2013   Depression 10/05/2013   Fatigue 10/05/2013   Vitamin D deficiency 10/05/2013   Kidney stones 05/20/2013   Severe obesity (BMI >= 40) (HCC) 02/18/2013   Lumbar degenerative disc disease 12/13/2012   Hyperlipidemia 08/16/2012   Hematuria 08/16/2012   Asthma 11/17/2011   Hypothyroid 11/17/2011    Past Medical History:  Diagnosis Date   Allergy    Asthma    Chronic low  back pain    DDD, DJD lumbar multilevel   Elevated transaminase level 06/2021   very mild, onset after starting fenofibrate. Staying on fenofibrate, getting RUQ abd u/s, and repeating labs 1 mo. Viral hep serologies neg.   Fibromyalgia 2017   Herniated disc L4-L5   High triglycerides    mild.  TLC.   Hypertension    Hypothyroidism    IBS (irritable bowel syndrome)     Internal hemorrhoids    Neuropathic pain    Rx'd neurontin by her orthopedist   Osteoarthritis of left knee 01/2021   [facility].  Also R knee osteo + med menisc tear (2022).  R knee visco started 10/2021 ([facility])   Palpitations 09/2018   toprol xl started by cardiologist->no tachyarrhythmia on telemetry   Rotator cuff tear 2023   Sprain of posterior cruciate ligament of knee right knee   Vitamin D deficiency 02/2017   high dose vit D started 02/23/17--vit D still low on recheck.  Needs 50K twice per week dosing indefinitely. Vit D level mid 40s Nov 2019.    Family History  Problem Relation Age of Onset   Arthritis Mother        osteo arithritis   Obesity Mother    Fibroids Mother        uterine   Lymphoma Father    Sarcoidosis Father    Pulmonary fibrosis Father    Celiac disease Sister    Neuropathy Brother    Arthritis Maternal Grandmother        rheumatoid   Cancer Maternal Grandmother        cervical   Heart disease Maternal Grandfather        a-fib   COPD Maternal Grandfather        emphysema   Vision loss Paternal Grandmother    Emphysema Paternal Grandfather    Lung cancer Paternal Grandfather    Cancer Daughter        ovarian   Asthma Son    Breast cancer Neg Hx    Past Surgical History:  Procedure Laterality Date   CARDIOVASCULAR STRESS TEST  09/19/2018   Low risk   COLONOSCOPY W/ POLYPECTOMY  09/2012   Dr. Loreta Ave: diverticulosis.  Recall 2024.   TOTAL ABDOMINAL HYSTERECTOMY W/ BILATERAL SALPINGOOPHORECTOMY  approx 2006   Nonmalignant reasons   TRANSTHORACIC ECHOCARDIOGRAM  09/19/2018   EF 60-65%, mild LVH, Grd I DD.   Social History   Social History Narrative   Married 26 yrs, 3 children (1 daught, 2 sons).   Orig from [place].   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   No tobacco, very rare alcohol, no drugs.         Immunization History  Administered Date(s) Administered   Influenza Split  07/26/2012   Influenza,inj,Quad PF,6+ Mos 07/12/2014, 06/15/2015, 06/20/2016, 07/01/2017, 07/02/2018, 07/26/2021   Influenza,inj,quad, With Preservative 07/09/2016   Influenza-Unspecified 09/21/2020   Moderna Covid-19 Vaccine Bivalent Booster 7yrs & up 05/18/2021   Moderna Sars-Covid-2 Vaccination 11/04/2019, 12/02/2019, 09/21/2020   PPD Test 11/30/2013, 10/27/2014, 07/01/2018   Tdap 03/26/2011, 04/11/2021   Zoster Recombinat (Shingrix) 07/01/2017, 09/11/2017     Objective: Vital Signs: BP 110/74 (BP Location: Right Wrist, Patient Position: Sitting, Cuff Size: Normal)    Pulse (!) 57    Ht 5\' 3"  (1.6 m)    Wt (!) 308 lb 9.6 oz (140 kg)    BMI 54.67 kg/m    Physical Exam Constitutional:  Appearance: She is obese.  Cardiovascular:     Rate and Rhythm: Normal rate and regular rhythm.  Pulmonary:     Effort: Pulmonary effort is normal.     Breath sounds: Normal breath sounds.  Skin:    General: Skin is warm and dry.  Neurological:     Mental Status: She is alert.  Psychiatric:        Mood and Affect: Mood normal.     Musculoskeletal Exam:  Neck full ROM no tenderness, mild pain with lateral rotation Shoulders right side full ROM, left shoulder pain proximally and laterally, unable to abduct overhead Tenderness to pressure over trapezius muscles and above scapulae Elbows full ROM no tenderness or swelling Wrists full ROM no tenderness or swelling Fingers full ROM no tenderness or swelling Knees full ROM, mild tenderness, crepitus present, left leg bruising over knee and distal leg Ankles full ROM no tenderness or swelling   Investigation: No additional findings.  Imaging: No results found.  Recent Labs: Lab Results  Component Value Date   WBC 5.5 04/11/2021   HGB 13.4 04/11/2021   PLT 280.0 04/11/2021   NA 141 11/13/2021   K 3.9 11/13/2021   CL 104 11/13/2021   CO2 30 11/13/2021   GLUCOSE 87 11/13/2021   BUN 18 11/13/2021   CREATININE 0.89 11/13/2021    BILITOT 0.6 07/26/2021   ALKPHOS 38 (L) 07/26/2021   AST 28 07/26/2021   ALT 37 (H) 07/26/2021   PROT 6.7 07/26/2021   ALBUMIN 4.4 07/26/2021   CALCIUM 9.6 11/13/2021   GFRAA >60 09/19/2018    Speciality Comments: No specialty comments available.  Procedures:  No procedures performed Allergies: Aspirin, Nsaids, and Sulfa antibiotics   Assessment / Plan:     Visit Diagnoses: Fibromyalgia syndrome  She does have some generalized sensitivity and pain even outside of focal problems in her back and shoulders and knees.  I do not see any evidence of rheumatoid arthritis or other inflammatory problem as a cause of her joint pains.  She is already on Lyrica currently tolerating twice daily dosing with a fair improvement in symptoms.  I recommend her to review online resources for patient self-management with chronic myofascial pain no specific medication changes recommended at this time.  Chronic left shoulder pain  Currently in exacerbation due to recent fall with fracture and full-thickness supraspinatus tear unable to fully abduct actively but has orthopedics follow-up planned.  Chronic pain of right knee  Not terrible on exam without much inflammatory change she has good range of motion and joint stability at this time so probably less important than some other joint problems first.  She would benefit with additional weight reduction both as a candidate for surgery and decreased pressure on knee joint.  Orders: No orders of the defined types were placed in this encounter.  No orders of the defined types were placed in this encounter.    Follow-Up Instructions: No follow-ups on file.   Fuller Plan, MD  Note - This record has been created using [employer].  Chart creation errors have been sought, but may not always  have been located. Such creation errors do not reflect on  the standard of medical care.

## 2021-11-18 ENCOUNTER — Ambulatory Visit: Payer: BC Managed Care – PPO | Admitting: Internal Medicine

## 2021-11-18 ENCOUNTER — Other Ambulatory Visit: Payer: Self-pay

## 2021-11-18 ENCOUNTER — Encounter: Payer: Self-pay | Admitting: Internal Medicine

## 2021-11-18 VITALS — BP 110/74 | HR 57 | Ht 63.0 in | Wt 308.6 lb

## 2021-11-18 DIAGNOSIS — M797 Fibromyalgia: Secondary | ICD-10-CM | POA: Diagnosis not present

## 2021-11-18 DIAGNOSIS — M25512 Pain in left shoulder: Secondary | ICD-10-CM | POA: Diagnosis not present

## 2021-11-18 DIAGNOSIS — G8929 Other chronic pain: Secondary | ICD-10-CM

## 2021-11-18 DIAGNOSIS — M25561 Pain in right knee: Secondary | ICD-10-CM | POA: Diagnosis not present

## 2021-11-18 NOTE — Patient Instructions (Addendum)
I agree with the current treatment plans from your orthopedic doctors that we discussed. I suspect your chronic pains are mostly coming from the osteoarthritis in multiple areas. ? ?I recommend checking out the [facility] patient-centered guide for fibromyalgia and chronic pain management: ?[email or web address] ?

## 2021-11-20 DIAGNOSIS — M75102 Unspecified rotator cuff tear or rupture of left shoulder, not specified as traumatic: Secondary | ICD-10-CM | POA: Diagnosis not present

## 2021-11-20 DIAGNOSIS — M25512 Pain in left shoulder: Secondary | ICD-10-CM | POA: Diagnosis not present

## 2021-11-21 ENCOUNTER — Encounter: Payer: Self-pay | Admitting: Family Medicine

## 2021-11-22 NOTE — Telephone Encounter (Signed)
Form completed and placed in Brenda Schroeder's box. ?Okay to cancel abdominal ultrasound. ?

## 2021-11-25 ENCOUNTER — Ambulatory Visit: Payer: BC Managed Care – PPO | Admitting: Rheumatology

## 2021-11-27 DIAGNOSIS — M170 Bilateral primary osteoarthritis of knee: Secondary | ICD-10-CM | POA: Diagnosis not present

## 2021-11-27 DIAGNOSIS — M1711 Unilateral primary osteoarthritis, right knee: Secondary | ICD-10-CM | POA: Insufficient documentation

## 2021-11-29 ENCOUNTER — Encounter: Payer: Self-pay | Admitting: Family Medicine

## 2021-12-03 DIAGNOSIS — M170 Bilateral primary osteoarthritis of knee: Secondary | ICD-10-CM | POA: Diagnosis not present

## 2021-12-04 NOTE — Progress Notes (Addendum)
Surgical Instructions ? ? ? Your procedure is scheduled on Thursday April 6. ? Report to [facility] Main Entrance "A" at 8:45 A.M., then check in with the Admitting office. ? Call this number if you have problems the morning of surgery: ? [phone] ? ? If you have any questions prior to your surgery date call [phone]: Open Monday-Friday 8am-4pm ? ? ? Remember: ? Do not eat after midnight the night before your surgery ? ?You may drink clear liquids until 7:45am the morning of your surgery.   ?Clear liquids allowed are: Water, Non-Citrus Juices (without pulp), Carbonated Beverages, Clear Tea, Black Coffee ONLY (NO MILK, CREAM OR POWDERED CREAMER of any kind), and Gatorade ?Patient Instructions ? ?The night before surgery:  ?No food after midnight. ONLY clear liquids after midnight ? ?The day of surgery (if you do NOT have diabetes):  ?Drink ONE (1) Pre-Surgery Clear Ensure by 7:45am the morning of surgery. Drink in one sitting. Do not sip.  ?This drink was given to you during your hospital  ?pre-op appointment visit. ? ?Nothing else to drink after completing the  ?Pre-Surgery Clear Ensure ? ?       If you have questions, please contact your surgeon?s office.  ? ? ?  ? Take these medicines the morning of surgery with A SIP OF WATER:  ?fenofibrate  ?levothyroxine (SYNTHROID) ?metoprolol succinate (TOPROL-XL) ?pregabalin (LYRICA) ?acetaminophen (TYLENOL) if needed ? ? ?As of today, STOP taking any Aspirin (unless otherwise instructed by your surgeon) Aleve, Naproxen, Ibuprofen, Motrin, Advil, Goody's, BC's, all herbal medications, fish oil, and all vitamins. ? ?         ?Do not wear jewelry or makeup ?Do not wear lotions, powders, perfumes/colognes, or deodorant. ?Do not shave 48 hours prior to surgery.  Men may shave face and neck. ?Do not bring valuables to the hospital. ?Do not wear nail polish, gel polish, artificial nails, or any other type of covering on natural nails (fingers and toes) ?If you have  artificial nails or gel coating that need to be removed by a nail salon, please have this removed prior to surgery. Artificial nails or gel coating may interfere with anesthesia's ability to adequately monitor your vital signs. ? ?[facility] is not responsible for any belongings or valuables. .  ? ?Do NOT Smoke (Tobacco/Vaping)  24 hours prior to your procedure ? ?If you use a CPAP at night, you may bring your mask for your overnight stay. ?  ?Contacts, glasses, hearing aids, dentures or partials may not be worn into surgery, please bring cases for these belongings ?  ?For patients admitted to the hospital, discharge time will be determined by your treatment team. ?  ?Patients discharged the day of surgery will not be allowed to drive home, and someone needs to stay with them for 24 hours. ? ? ?SURGICAL WAITING ROOM VISITATION ?Patients having surgery or a procedure in a hospital may have two support people. ?Children under the age of 54 must have an adult with them who is not the patient. ?They may stay in the waiting area during the procedure and may switch out with other visitors. If the patient needs to stay at the hospital during part of their recovery, the visitor guidelines for inpatient rooms apply. ? ?Please refer to the [facility] website for the visitor guidelines for Inpatients (after your surgery is over and you are in a regular room).  ? ? ? ? ? ?Special instructions:   ? ?Oral Hygiene is also  important to reduce your risk of infection.  Remember - BRUSH YOUR TEETH THE MORNING OF SURGERY WITH YOUR REGULAR TOOTHPASTE ? ? ?[facility]- Preparing For Surgery ? ?Before surgery, you can play an important role. Because skin is not sterile, your skin needs to be as free of germs as possible. You can reduce the number of germs on your skin by washing with CHG (chlorahexidine gluconate) Soap before surgery.  CHG is an antiseptic cleaner which kills germs and bonds with the skin to continue killing germs even  after washing.   ? ? ?Please do not use if you have an allergy to CHG or antibacterial soaps. If your skin becomes reddened/irritated stop using the CHG.  ?Do not shave (including legs and underarms) for at least 48 hours prior to first CHG shower. It is OK to shave your face. ? ?Please follow these instructions carefully. ?  ? ? Shower the NIGHT BEFORE SURGERY and the MORNING OF SURGERY with CHG Soap.  ? If you chose to wash your hair, wash your hair first as usual with your normal shampoo. After you shampoo, rinse your hair and body thoroughly to remove the shampoo.  Then [employer] and genitals (private parts) with your normal soap and rinse thoroughly to remove soap. ? ?After that Use CHG Soap as you would any other liquid soap. You can apply CHG directly to the skin and wash gently with a scrungie or a clean washcloth.  ? ?Apply the CHG Soap to your body ONLY FROM THE NECK DOWN.  Do not use on open wounds or open sores. Avoid contact with your eyes, ears, mouth and genitals (private parts). Wash Face and genitals (private parts)  with your normal soap.  ? ?Wash thoroughly, paying special attention to the area where your surgery will be performed. ? ?Thoroughly rinse your body with warm water from the neck down. ? ?DO NOT shower/wash with your normal soap after using and rinsing off the CHG Soap. ? ?Pat yourself dry with a CLEAN TOWEL. ? ?Wear CLEAN PAJAMAS to bed the night before surgery ? ?Place CLEAN SHEETS on your bed the night before your surgery ? ?DO NOT SLEEP WITH PETS. ? ? ?Day of Surgery: ? ?Take a shower with CHG soap. ?Wear Clean/Comfortable clothing the morning of surgery ?Do not apply any deodorants/lotions.   ?Remember to brush your teeth WITH YOUR REGULAR TOOTHPASTE. ? ? ? ?If you received a COVID test during your pre-op visit  it is requested that you wear a mask when out in public, stay away from anyone that may not be feeling well and notify your surgeon if you develop symptoms. If you have  been in contact with anyone that has tested positive in the last 10 days please notify you surgeon. ? ?  ?Please read over the following fact sheets that you were given.   ?

## 2021-12-05 ENCOUNTER — Other Ambulatory Visit: Payer: Self-pay

## 2021-12-05 ENCOUNTER — Encounter (HOSPITAL_COMMUNITY)
Admission: RE | Admit: 2021-12-05 | Discharge: 2021-12-05 | Disposition: A | Discharge status: Home / Self Care | Payer: BC Managed Care – PPO | Source: Ambulatory Visit | Attending: Orthopedic Surgery | Admitting: Orthopedic Surgery

## 2021-12-05 ENCOUNTER — Encounter (HOSPITAL_COMMUNITY): Payer: Self-pay

## 2021-12-05 VITALS — BP 126/90 | HR 51 | Temp 98.5°F | Resp 18 | Ht 63.0 in | Wt 307.4 lb

## 2021-12-05 DIAGNOSIS — Z9989 Dependence on other enabling machines and devices: Secondary | ICD-10-CM | POA: Diagnosis not present

## 2021-12-05 DIAGNOSIS — Z01812 Encounter for preprocedural laboratory examination: Secondary | ICD-10-CM | POA: Diagnosis not present

## 2021-12-05 DIAGNOSIS — Z01818 Encounter for other preprocedural examination: Secondary | ICD-10-CM

## 2021-12-05 DIAGNOSIS — G4733 Obstructive sleep apnea (adult) (pediatric): Secondary | ICD-10-CM | POA: Insufficient documentation

## 2021-12-05 HISTORY — DX: Sleep apnea, unspecified: G47.30

## 2021-12-05 HISTORY — DX: Anxiety disorder, unspecified: F41.9

## 2021-12-05 HISTORY — DX: Personal history of urinary calculi: Z87.442

## 2021-12-05 LAB — CBC
HCT: 41.2 % (ref 36.0–46.0)
Hemoglobin: 13.1 g/dL (ref 12.0–15.0)
MCH: 28.2 pg (ref 26.0–34.0)
MCHC: 31.8 g/dL (ref 30.0–36.0)
MCV: 88.8 fL (ref 80.0–100.0)
Platelets: 311 10*3/uL (ref 150–400)
RBC: 4.64 MIL/uL (ref 3.87–5.11)
RDW: 13.3 % (ref 11.5–15.5)
WBC: 6.3 10*3/uL (ref 4.0–10.5)
nRBC: 0 % (ref 0.0–0.2)

## 2021-12-05 LAB — BASIC METABOLIC PANEL
Anion gap: 7 (ref 5–15)
BUN: 17 mg/dL (ref 6–20)
CO2: 25 mmol/L (ref 22–32)
Calcium: 9.4 mg/dL (ref 8.9–10.3)
Chloride: 110 mmol/L (ref 98–111)
Creatinine, Ser: 1.02 mg/dL — ABNORMAL HIGH (ref 0.44–1.00)
GFR, Estimated: >60 mL/min (ref >60)
Glucose, Bld: 91 mg/dL (ref 70–99)
Potassium: 3.8 mmol/L (ref 3.5–5.1)
Sodium: 142 mmol/L (ref 135–145)

## 2021-12-05 NOTE — Progress Notes (Signed)
PCP - Norval Morton ?Cardiologist - Eppie Gibson ,MD ? ?PPM/ICD - denies ?Device Orders -  ?Rep Notified -  ? ?Chest x-ray - n/a ?EKG - 10/03/21 ?Stress Test - 09/21/18 ?ECHO - 10/11/21 ?Cardiac Cath - none ? ?Sleep Study - 2016 ?CPAP - yes. ? ?Fasting Blood Sugar - n/a ?Checks Blood Sugar _____ times a day ? ?Blood Thinner Instructions:n/a ?Aspirin Instructions:n/a ? ?ERAS Protcol -clear liquids until 0745 ?PRE-SURGERY Ensure or G2- ensure ? ?COVID TEST- n/a ? ? ?Anesthesia review: yes- cardiac history ? ?Patient denies shortness of breath, fever, cough and chest pain at PAT appointment ? ? ?All instructions explained to the patient, with a verbal understanding of the material. Patient agrees to go over the instructions while at home for a better understanding. Patient also instructed to wear a mask when out in public prior to surgery. The opportunity to ask questions was provided. ?  ?

## 2021-12-06 NOTE — Anesthesia Preprocedure Evaluation (Addendum)
Anesthesia Evaluation  ?Patient identified by MRN, date of birth, ID band ?Patient awake ? ? ? ?Reviewed: ?Allergy & Precautions, Patient's Chart, lab work & pertinent test results ? ?Airway ?Mallampati: I ? ? ? ? ? ? Dental ?no notable dental hx. ? ?  ?Pulmonary ?sleep apnea and Continuous Positive Airway Pressure Ventilation ,  ?  ?Pulmonary exam normal ? ? ? ? ? ? ? Cardiovascular ?hypertension, Pt. on home beta blockers and Pt. on medications ?Normal cardiovascular exam ? ? ?  ?Neuro/Psych ?PSYCHIATRIC DISORDERS Anxiety Depression  Neuromuscular disease   ? GI/Hepatic ?negative GI ROS, Neg liver ROS,   ?Endo/Other  ?Hypothyroidism Morbid obesity ? Renal/GU ?  ?negative genitourinary ?  ?Musculoskeletal ? ?(+) Arthritis , Osteoarthritis,   ? Abdominal ?(+) + obese,   ?Peds ? Hematology ?  ?Anesthesia Other Findings ? ? Reproductive/Obstetrics ? ?  ? ? ? ? ? ? ? ? ? ? ? ? ? ?  ?  ? ? ? ? ? ? ?Anesthesia Physical ?Anesthesia Plan ? ?ASA: 3 ? ?Anesthesia Plan: General  ? ?Post-op Pain Management: Regional block*  ? ?Induction: Intravenous ? ?PONV Risk Score and Plan: 3 and Ondansetron, Dexamethasone and Midazolam ? ?Airway Management Planned: Oral ETT ? ?Additional Equipment: None ? ?Intra-op Plan:  ? ?Post-operative Plan: Extubation in OR ? ?Informed Consent: I have reviewed the patients History and Physical, chart, labs and discussed the procedure including the risks, benefits and alternatives for the proposed anesthesia with the patient or authorized representative who has indicated his/her understanding and acceptance.  ? ? ? ?Dental advisory given ? ?Plan Discussed with: CRNA ? ?Anesthesia Plan Comments: (PAT note by Antionette Poles, PA-C: ?Patient recently evaluated by cardiology at [facility] for palpitations.  Per office visit note 10/31/2021, "- Physical exam is reassuring today; there is?no evidence of arrhythmia, heart murmur, or CHF -?Ziopatch and echo largely unremarkable - patient  can continue normal exercise routine and I encouraged physical activity."  She was advised to follow-up with cardiology on an as-needed basis. ? ?OSA on CPAP. ? ?Preop labs reviewed, unremarkable. ? ?EKG 10/03/2021 (Care Everywhere, tracing requested): NSR.  Rate 60. ? ?Event monitor 10/15/2021 (Care Everywhere): ?Patient had a min HR of 46 bpm, max HR of 162 bpm, and avg HR of 66 bpm.  ?Predominant underlying rhythm was Sinus Rhythm. 13 Supraventricular Tachycardia  ?runs occurred, the run with the fastest interval lasting 13 beats with a max rate of  ?162 bpm, the longest lasting 20 beats with an avg rate of 144 bpm.  ?Supraventricular Tachycardia was detected within +/- 45 seconds of symptomatic  ?patient event(s). Some episodes of Supraventricular Tachycardia may be possible  ?Atrial Tachycardia with variable block. Isolated SVEs were rare (<1.0%), SVE  ?Couplets were rare (<1.0%), and SVE Triplets were rare (<1.0%). Isolated VEs were  ?rare (<1.0%), and no VE Couplets or VE Triplets were present. Ventricular Trigeminy  ?was present. ? ? ?TTE 10/11/2021 (Care Everywhere): ?Summary  ???1. Technically difficult study due to body habitus.  ???2. The left ventricular systolic function is normal with no obvious regional  ?wall motion abnormalities, LVEF is visually estimated at > 55%.  ? ? ?Left Ventricle  ???The left ventricular systolic function is normal with no obvious regional  ?wall motion abnormalities, LVEF is visually estimated at > 55%.  ? ? ?Aortic Valve  ???The aortic valve is poorly visualized with probably normal excursion with  ?poorly visualized but probably normal leaflets with normal excursion.  ? ?Pulmonic Valve  ???  The pulmonic valve is poorly visualized, but probably normal.  ? ?Mitral Valve  ???The mitral valve leaflets are poorly visualized but probably normal with  ?probably normal leaflet mobility.  ???There is no significant mitral valve regurgitation.  ? ? ?Inferior Vena Cava  ???IVC size and  inspiratory change suggest normal right atrial pressure. (0-5  ?mmHg). ? ? ? ?)  ? ? ? ? ?Anesthesia Quick Evaluation ? ?

## 2021-12-06 NOTE — Progress Notes (Signed)
Anesthesia Chart Review: ? ?Patient recently evaluated by cardiology at [facility] for palpitations.  Per office visit note 10/31/2021, "- Physical exam is reassuring today; there is no evidence of arrhythmia, heart murmur, or CHF - Ziopatch and echo largely unremarkable - patient can continue normal exercise routine and I encouraged physical activity."  She was advised to follow-up with cardiology on an as-needed basis. ? ?OSA on CPAP. ? ?Preop labs reviewed, unremarkable. ? ?EKG 10/03/2021 (Care Everywhere, tracing requested): NSR.  Rate 60. ? ?Event monitor 10/15/2021 (Care Everywhere): ?Patient had a min HR of 46 bpm, max HR of 162 bpm, and avg HR of 66 bpm.  ?Predominant underlying rhythm was Sinus Rhythm. 13 Supraventricular Tachycardia  ?runs occurred, the run with the fastest interval lasting 13 beats with a max rate of  ?162 bpm, the longest lasting 20 beats with an avg rate of 144 bpm.  ?Supraventricular Tachycardia was detected within +/- 45 seconds of symptomatic  ?patient event(s). Some episodes of Supraventricular Tachycardia may be possible  ?Atrial Tachycardia with variable block. Isolated SVEs were rare (<1.0%), SVE  ?Couplets were rare (<1.0%), and SVE Triplets were rare (<1.0%). Isolated VEs were  ?rare (<1.0%), and no VE Couplets or VE Triplets were present. Ventricular Trigeminy  ?was present.   ? ?TTE 10/11/2021 (Care Everywhere): ?Summary  ?  1. Technically difficult study due to body habitus.  ?  2. The left ventricular systolic function is normal with no obvious regional  ?wall motion abnormalities, LVEF is visually estimated at > 55%.  ? ? ?Left Ventricle  ?  The left ventricular systolic function is normal with no obvious regional  ?wall motion abnormalities, LVEF is visually estimated at > 55%.  ? ? ?Aortic Valve  ?  The aortic valve is poorly visualized with probably normal excursion with  ?poorly visualized but probably normal leaflets with normal excursion.  ? ?Pulmonic Valve  ?  The pulmonic valve  is poorly visualized, but probably normal.  ? ?Mitral Valve  ?  The mitral valve leaflets are poorly visualized but probably normal with  ?probably normal leaflet mobility.  ?  There is no significant mitral valve regurgitation.  ? ? ?Inferior Vena Cava  ?  IVC size and inspiratory change suggest normal right atrial pressure. (0-5  ?mmHg).   ? ? ? ?Brenda Poles, PA-C ?[facility]/Anesthesiology ?Phone [phone] ?12/06/2021 1:55 PM ? ?

## 2021-12-10 DIAGNOSIS — M170 Bilateral primary osteoarthritis of knee: Secondary | ICD-10-CM | POA: Diagnosis not present

## 2021-12-11 NOTE — Progress Notes (Signed)
Patient voiced understanding of new arrival time of 0530. Voiced understanding to finish ERAS drink by 0430. ?

## 2021-12-12 ENCOUNTER — Encounter (HOSPITAL_COMMUNITY): Payer: Self-pay | Admitting: Orthopedic Surgery

## 2021-12-12 ENCOUNTER — Ambulatory Visit (HOSPITAL_COMMUNITY): Payer: BC Managed Care – PPO | Admitting: Physician Assistant

## 2021-12-12 ENCOUNTER — Encounter (HOSPITAL_COMMUNITY)
Admission: RE | Disposition: A | Discharge status: Home / Self Care | Payer: Self-pay | Source: Home / Self Care | Attending: Orthopedic Surgery

## 2021-12-12 ENCOUNTER — Ambulatory Visit (HOSPITAL_COMMUNITY)
Admission: RE | Admit: 2021-12-12 | Discharge: 2021-12-12 | Disposition: A | Discharge status: Home / Self Care | Payer: BC Managed Care – PPO | Attending: Orthopedic Surgery | Admitting: Orthopedic Surgery

## 2021-12-12 ENCOUNTER — Ambulatory Visit (HOSPITAL_COMMUNITY): Payer: BC Managed Care – PPO | Admitting: Anesthesiology

## 2021-12-12 ENCOUNTER — Other Ambulatory Visit: Payer: Self-pay

## 2021-12-12 DIAGNOSIS — M7582 Other shoulder lesions, left shoulder: Secondary | ICD-10-CM | POA: Insufficient documentation

## 2021-12-12 DIAGNOSIS — S46012A Strain of muscle(s) and tendon(s) of the rotator cuff of left shoulder, initial encounter: Secondary | ICD-10-CM | POA: Diagnosis not present

## 2021-12-12 DIAGNOSIS — M7542 Impingement syndrome of left shoulder: Secondary | ICD-10-CM | POA: Diagnosis not present

## 2021-12-12 DIAGNOSIS — Z6841 Body Mass Index (BMI) 40.0 and over, adult: Secondary | ICD-10-CM | POA: Insufficient documentation

## 2021-12-12 DIAGNOSIS — M75122 Complete rotator cuff tear or rupture of left shoulder, not specified as traumatic: Secondary | ICD-10-CM | POA: Diagnosis not present

## 2021-12-12 DIAGNOSIS — G8918 Other acute postprocedural pain: Secondary | ICD-10-CM | POA: Diagnosis not present

## 2021-12-12 DIAGNOSIS — M25812 Other specified joint disorders, left shoulder: Secondary | ICD-10-CM | POA: Diagnosis not present

## 2021-12-12 DIAGNOSIS — G4730 Sleep apnea, unspecified: Secondary | ICD-10-CM | POA: Insufficient documentation

## 2021-12-12 DIAGNOSIS — I10 Essential (primary) hypertension: Secondary | ICD-10-CM | POA: Diagnosis not present

## 2021-12-12 DIAGNOSIS — X58XXXA Exposure to other specified factors, initial encounter: Secondary | ICD-10-CM | POA: Insufficient documentation

## 2021-12-12 DIAGNOSIS — S43432A Superior glenoid labrum lesion of left shoulder, initial encounter: Secondary | ICD-10-CM | POA: Diagnosis not present

## 2021-12-12 DIAGNOSIS — M75102 Unspecified rotator cuff tear or rupture of left shoulder, not specified as traumatic: Secondary | ICD-10-CM | POA: Diagnosis not present

## 2021-12-12 DIAGNOSIS — F419 Anxiety disorder, unspecified: Secondary | ICD-10-CM | POA: Insufficient documentation

## 2021-12-12 DIAGNOSIS — F32A Depression, unspecified: Secondary | ICD-10-CM | POA: Diagnosis not present

## 2021-12-12 DIAGNOSIS — S4382XA Sprain of other specified parts of left shoulder girdle, initial encounter: Secondary | ICD-10-CM | POA: Diagnosis not present

## 2021-12-12 DIAGNOSIS — M7522 Bicipital tendinitis, left shoulder: Secondary | ICD-10-CM | POA: Insufficient documentation

## 2021-12-12 DIAGNOSIS — E039 Hypothyroidism, unspecified: Secondary | ICD-10-CM | POA: Insufficient documentation

## 2021-12-12 HISTORY — PX: SHOULDER ARTHROSCOPY WITH ROTATOR CUFF REPAIR AND SUBACROMIAL DECOMPRESSION: SHX5686

## 2021-12-12 SURGERY — SHOULDER ARTHROSCOPY WITH ROTATOR CUFF REPAIR AND SUBACROMIAL DECOMPRESSION
Anesthesia: General | Laterality: Left

## 2021-12-12 MED ORDER — SUGAMMADEX SODIUM 500 MG/5ML IV SOLN
INTRAVENOUS | Status: DC | PRN
  Administered 2021-12-12: 200 mg via INTRAVENOUS
  Administered 2021-12-12: 300 mg via INTRAVENOUS

## 2021-12-12 MED ORDER — ACETAMINOPHEN 160 MG/5ML PO SOLN: 325.0000 mg | ORAL | Status: DC | PRN

## 2021-12-12 MED ORDER — ONDANSETRON HCL 4 MG/2ML IJ SOLN: 4.0000 mg | Freq: Once | INTRAMUSCULAR | Status: DC | PRN

## 2021-12-12 MED ORDER — MEPERIDINE HCL 25 MG/ML IJ SOLN: 6.2500 mg | INTRAMUSCULAR | Status: DC | PRN

## 2021-12-12 MED ORDER — FENTANYL CITRATE (PF) 100 MCG/2ML IJ SOLN
INTRAMUSCULAR | Status: DC | PRN
  Administered 2021-12-12: 50 ug via INTRAVENOUS
  Administered 2021-12-12: 100 ug via INTRAVENOUS

## 2021-12-12 MED ORDER — FENTANYL CITRATE (PF) 100 MCG/2ML IJ SOLN
INTRAMUSCULAR | Status: AC
  Filled 2021-12-12: qty 2

## 2021-12-12 MED ORDER — OXYCODONE HCL 5 MG/5ML PO SOLN
5.0000 mg | Freq: Once | ORAL | Status: AC | PRN
  Administered 2021-12-12: 5 mg via ORAL

## 2021-12-12 MED ORDER — ORAL CARE MOUTH RINSE: 15.0000 mL | Freq: Once | OROMUCOSAL | Status: AC

## 2021-12-12 MED ORDER — MIDAZOLAM HCL 5 MG/5ML IJ SOLN
INTRAMUSCULAR | Status: DC | PRN
  Administered 2021-12-12: 2 mg via INTRAVENOUS

## 2021-12-12 MED ORDER — SODIUM CHLORIDE 0.9 % IR SOLN
Status: DC | PRN
  Administered 2021-12-12 (×2): 3000 mL

## 2021-12-12 MED ORDER — CEFAZOLIN IN SODIUM CHLORIDE 3-0.9 GM/100ML-% IV SOLN
3.0000 g | INTRAVENOUS | Status: DC
  Filled 2021-12-12: qty 100

## 2021-12-12 MED ORDER — EPHEDRINE SULFATE-NACL 50-0.9 MG/10ML-% IV SOSY
PREFILLED_SYRINGE | INTRAVENOUS | Status: DC | PRN
  Administered 2021-12-12: 10 mg via INTRAVENOUS
  Administered 2021-12-12: 5 mg via INTRAVENOUS

## 2021-12-12 MED ORDER — EPINEPHRINE PF 1 MG/ML IJ SOLN
INTRAMUSCULAR | Status: DC | PRN
  Administered 2021-12-12: 1 mg

## 2021-12-12 MED ORDER — PHENYLEPHRINE HCL-NACL 20-0.9 MG/250ML-% IV SOLN
INTRAVENOUS | Status: DC | PRN
  Administered 2021-12-12: 25 ug/min via INTRAVENOUS

## 2021-12-12 MED ORDER — ACETAMINOPHEN 10 MG/ML IV SOLN
1000.0000 mg | Freq: Once | INTRAVENOUS | Status: DC | PRN
  Administered 2021-12-12: 1000 mg via INTRAVENOUS

## 2021-12-12 MED ORDER — BUPIVACAINE LIPOSOME 1.3 % IJ SUSP
INTRAMUSCULAR | Status: DC | PRN
  Administered 2021-12-12 (×5): 2 mL via PERINEURAL

## 2021-12-12 MED ORDER — LACTATED RINGERS IV SOLN: INTRAVENOUS | Status: DC

## 2021-12-12 MED ORDER — LIDOCAINE 2% (20 MG/ML) 5 ML SYRINGE
INTRAMUSCULAR | Status: DC | PRN
  Administered 2021-12-12: 80 mg via INTRAVENOUS

## 2021-12-12 MED ORDER — ONDANSETRON HCL 4 MG/2ML IJ SOLN
INTRAMUSCULAR | Status: DC | PRN
  Administered 2021-12-12: 4 mg via INTRAVENOUS

## 2021-12-12 MED ORDER — OXYCODONE HCL 5 MG PO TABS: 5.0000 mg | ORAL_TABLET | Freq: Once | ORAL | Status: AC | PRN

## 2021-12-12 MED ORDER — ROCURONIUM BROMIDE 10 MG/ML (PF) SYRINGE
PREFILLED_SYRINGE | INTRAVENOUS | Status: DC | PRN
  Administered 2021-12-12: 60 mg via INTRAVENOUS
  Administered 2021-12-12: 25 mg via INTRAVENOUS

## 2021-12-12 MED ORDER — PHENYLEPHRINE 40 MCG/ML (10ML) SYRINGE FOR IV PUSH (FOR BLOOD PRESSURE SUPPORT)
PREFILLED_SYRINGE | INTRAVENOUS | Status: DC | PRN
Start: 2021-12-12 — End: 2021-12-12

## 2021-12-12 MED ORDER — ACETAMINOPHEN 325 MG PO TABS: 325.0000 mg | ORAL_TABLET | ORAL | Status: DC | PRN

## 2021-12-12 MED ORDER — FENTANYL CITRATE (PF) 250 MCG/5ML IJ SOLN
INTRAMUSCULAR | Status: AC
  Filled 2021-12-12: qty 5

## 2021-12-12 MED ORDER — BUPIVACAINE-EPINEPHRINE (PF) 0.5% -1:200000 IJ SOLN
INTRAMUSCULAR | Status: DC | PRN
  Administered 2021-12-12 (×5): 3 mL via PERINEURAL

## 2021-12-12 MED ORDER — OXYCODONE HCL 5 MG PO TABS: 5.0000 mg | ORAL_TABLET | ORAL | 0 refills | Status: DC | PRN

## 2021-12-12 MED ORDER — ACETAMINOPHEN 10 MG/ML IV SOLN
INTRAVENOUS | Status: AC
  Filled 2021-12-12: qty 100

## 2021-12-12 MED ORDER — PHENYLEPHRINE HCL-NACL 20-0.9 MG/250ML-% IV SOLN: INTRAVENOUS | Status: DC | PRN

## 2021-12-12 MED ORDER — ONDANSETRON HCL 4 MG PO TABS: 4.0000 mg | ORAL_TABLET | Freq: Three times a day (TID) | ORAL | 1 refills | Status: DC | PRN

## 2021-12-12 MED ORDER — PROPOFOL 10 MG/ML IV BOLUS
INTRAVENOUS | Status: DC | PRN
  Administered 2021-12-12: 200 mg via INTRAVENOUS
  Administered 2021-12-12 (×3): 10 mg via INTRAVENOUS

## 2021-12-12 MED ORDER — MIDAZOLAM HCL 2 MG/2ML IJ SOLN
INTRAMUSCULAR | Status: AC
  Filled 2021-12-12: qty 2

## 2021-12-12 MED ORDER — PROPOFOL 10 MG/ML IV BOLUS
INTRAVENOUS | Status: AC
  Filled 2021-12-12: qty 20

## 2021-12-12 MED ORDER — CHLORHEXIDINE GLUCONATE 0.12 % MT SOLN
15.0000 mL | Freq: Once | OROMUCOSAL | Status: AC
  Administered 2021-12-12: 15 mL via OROMUCOSAL
  Filled 2021-12-12: qty 15

## 2021-12-12 MED ORDER — OXYCODONE HCL 5 MG/5ML PO SOLN
ORAL | Status: AC
  Filled 2021-12-12: qty 5

## 2021-12-12 MED ORDER — DEXTROSE 5 % IV SOLN
INTRAVENOUS | Status: DC | PRN
  Administered 2021-12-12: 3 g via INTRAVENOUS

## 2021-12-12 MED ORDER — FENTANYL CITRATE (PF) 100 MCG/2ML IJ SOLN
25.0000 ug | INTRAMUSCULAR | Status: DC | PRN
  Administered 2021-12-12: 50 ug via INTRAVENOUS

## 2021-12-12 SURGICAL SUPPLY — 46 items
ANCHOR FIBERTAK 2.6X1.7 BLUE (Anchor) ×1 IMPLANT
ANCHOR SUT FBRTK 2.6 SOFT 1.7 (Anchor) ×1 IMPLANT
ANCHOR SWIVELOCK BIO 4.75X19.1 (Anchor) ×2 IMPLANT
BAG COUNTER SPONGE SURGICOUNT (BAG) ×2 IMPLANT
BLADE SURG 11 STRL SS (BLADE) ×2 IMPLANT
CANNULA TWIST IN 8.25X7CM (CANNULA) ×3 IMPLANT
CLSR STERI-STRIP ANTIMIC 1/2X4 (GAUZE/BANDAGES/DRESSINGS) ×1 IMPLANT
DRAPE INCISE IOBAN 66X45 STRL (DRAPES) IMPLANT
DRAPE STERI 35X30 U-POUCH (DRAPES) ×2 IMPLANT
DRAPE U-SHAPE 47X51 STRL (DRAPES) ×2 IMPLANT
DRSG PAD ABDOMINAL 8X10 ST (GAUZE/BANDAGES/DRESSINGS) ×6 IMPLANT
DURAPREP 26ML APPLICATOR (WOUND CARE) ×2 IMPLANT
GAUZE SPONGE 4X4 12PLY STRL (GAUZE/BANDAGES/DRESSINGS) ×2 IMPLANT
GAUZE SPONGE 4X4 12PLY STRL LF (GAUZE/BANDAGES/DRESSINGS) ×1 IMPLANT
GLOVE SRG 8 PF TXTR STRL LF DI (GLOVE) ×1 IMPLANT
GLOVE SURG ENC MOIS LTX SZ7.5 (GLOVE) ×4 IMPLANT
GLOVE SURG UNDER POLY LF SZ8 (GLOVE) ×2
GOWN STRL REUS W/ TWL LRG LVL3 (GOWN DISPOSABLE) ×1 IMPLANT
GOWN STRL REUS W/TWL LRG LVL3 (GOWN DISPOSABLE) ×2
GOWN STRL REUS W/TWL XL LVL3 (GOWN DISPOSABLE) ×4 IMPLANT
KIT ANCHOR FBRTK 2.6 STR (KITS) ×1 IMPLANT
KIT BASIN OR (CUSTOM PROCEDURE TRAY) ×2 IMPLANT
KIT TURNOVER KIT B (KITS) ×2 IMPLANT
MANIFOLD NEPTUNE II (INSTRUMENTS) ×2 IMPLANT
NDL SCORPION MULTI FIRE (NEEDLE) IMPLANT
NDL SPNL 18GX3.5 QUINCKE PK (NEEDLE) ×1 IMPLANT
NEEDLE SCORPION MULTI FIRE (NEEDLE) IMPLANT
NEEDLE SPNL 18GX3.5 QUINCKE PK (NEEDLE) ×2 IMPLANT
NS IRRIG 1000ML POUR BTL (IV SOLUTION) ×2 IMPLANT
PACK SHOULDER (CUSTOM PROCEDURE TRAY) ×2 IMPLANT
PAD ABD 8X10 STRL (GAUZE/BANDAGES/DRESSINGS) ×1 IMPLANT
PAD ARMBOARD 7.5X6 YLW CONV (MISCELLANEOUS) ×4 IMPLANT
PROBE BIPOLAR ATHRO 135MM 90D (MISCELLANEOUS) IMPLANT
SLEEVE ARM SUSPENSION SYSTEM (MISCELLANEOUS) ×2 IMPLANT
SLING ARM FOAM STRAP XLG (SOFTGOODS) ×1 IMPLANT
SLING S3 LATERAL DISP (MISCELLANEOUS) ×2 IMPLANT
SPONGE T-LAP 4X18 ~~LOC~~+RFID (SPONGE) ×2 IMPLANT
SUT ETHILON 3 0 PS 1 (SUTURE) ×2 IMPLANT
SUT TIGER TAPE 7 IN WHITE (SUTURE) IMPLANT
TAPE CLOTH SURG 4X10 WHT LF (GAUZE/BANDAGES/DRESSINGS) ×1 IMPLANT
TAPE FIBER 2MM 7IN #2 BLUE (SUTURE) IMPLANT
TAPE PAPER 3X10 WHT MICROPORE (GAUZE/BANDAGES/DRESSINGS) ×2 IMPLANT
TOWEL GREEN STERILE (TOWEL DISPOSABLE) ×2 IMPLANT
TOWEL GREEN STERILE FF (TOWEL DISPOSABLE) ×2 IMPLANT
TUBING ARTHROSCOPY IRRIG 16FT (MISCELLANEOUS) ×2 IMPLANT
WATER STERILE IRR 1000ML POUR (IV SOLUTION) ×2 IMPLANT

## 2021-12-12 NOTE — Brief Op Note (Signed)
12/12/2021 ? ?9:21 AM ? ?PATIENT:  Brenda Schroeder  61 y.o. female ? ?PRE-OPERATIVE DIAGNOSIS:  Left shoulder rotator cuff tear, impingement ? ?POST-OPERATIVE DIAGNOSIS:  Left shoulder rotator cuff tear, impingement ? ?PROCEDURE:  Procedure(s) with comments: ?Left shoulder arthroscopy with rotator cuff repair, extensive debridement and subacromial decompression (Left) - 2 hrs ? ?SURGEON:  Surgeon(s) and Role: ?   Aundria Rud, Noah Delaine, MD - Primary ? ?PHYSICIAN ASSISTANT: Dion Saucier, PA-C ? ?ANESTHESIA:   regional and general ? ?EBL:  10 cc ? ?BLOOD ADMINISTERED:none ? ?DRAINS: none  ? ?LOCAL MEDICATIONS USED:  NONE ? ?SPECIMEN:  No Specimen ? ?DISPOSITION OF SPECIMEN:  N/A ? ?COUNTS:  YES ? ?TOURNIQUET:  * No tourniquets in log * ? ?DICTATION: .Note written in EPIC ? ?PLAN OF CARE: Discharge to home after PACU ? ?PATIENT DISPOSITION:  PACU - hemodynamically stable. ?  ?Delay start of Pharmacological VTE agent (>24hrs) due to surgical blood loss or risk of bleeding: not applicable ? ?

## 2021-12-12 NOTE — Discharge Instructions (Signed)
Orthopedic discharge instructions ? ?-Maintain your arm in sling at all times.  You may remove the sling for showering and getting dressed, but do not actively use the left arm.  You should sleep in your sling. ? ?-Maintain postoperative bandages for 2 days.  He may remove these bandages on the third day from surgery and begin showering at that time.  Please do not submerge your incisions in water. ? ?-You should apply ice liberally to the operative shoulder throughout the day.  Do this for 20 to 30 minutes out of each hour that you are able.  For mild to moderate pain use Tylenol and Advil in alternating fashion around-the-clock.  For breakthrough pain use oxycodone as necessary. ? ?-You may move your elbow, hand and wrist out of the sling to prevent stiffness.  You should do this throughout the day.  However, do not move the arm/upper arm away from the body.  You can use your left hand for light activities such as cell phone use or assisting with eating and getting dressed. ? ?-Return to see Dr. Stann Mainland in 2 weeks for routine postoperative check. ?

## 2021-12-12 NOTE — H&P (Signed)
? ?ORTHOPAEDIC H&P ? ?REQUESTING PHYSICIAN: Nicholes Stairs, MD ? ?PCP:  Tammi Sou, MD ? ?Chief Complaint: Left shoulder pain ? ?HPI: ?Brenda Schroeder is a 61 y.o. female who complains of left shoulder pain and weakness.  She has had recalcitrant symptoms to conservative care.  Here today for arthroscopic surgery. ? ?Past Medical History:  ?Diagnosis Date  ? Allergy   ? Anxiety   ? Asthma   ? Chronic low back pain   ? DDD, DJD lumbar multilevel  ? Elevated transaminase level 06/2021  ? very mild, onset after starting fenofibrate. Staying on fenofibrate, getting RUQ abd u/s, and repeating labs 1 mo. Viral hep serologies neg.  ? Fibromyalgia 2017  ? Herniated disc L4-L5  ? High triglycerides   ? mild.  TLC.  ? History of kidney stones   ? Hypertension   ? Hypothyroidism   ? IBS (irritable bowel syndrome)   ? Internal hemorrhoids   ? Left rotator cuff tear   ? 2023  ? Neuropathic pain   ? Rx'd neurontin by her orthopedist  ? Osteoarthritis of left knee 01/2021  ? [facility].  Also R knee osteo + med menisc tear (2022).  R knee visco started 10/2021 ([facility])  ? Palpitations 09/2018  ? toprol xl started by cardiologist->no tachyarrhythmia on telemetry  ? Sleep apnea   ? Sprain of posterior cruciate ligament of knee right knee  ? Vitamin D deficiency 02/2017  ? high dose vit D started 02/23/17--vit D still low on recheck.  Needs 50K twice per week dosing indefinitely. Vit D level mid 40s Nov 2019.  ? ?Past Surgical History:  ?Procedure Laterality Date  ? CARDIOVASCULAR STRESS TEST  09/19/2018  ? Low risk  ? COLONOSCOPY W/ POLYPECTOMY  09/2012  ? Dr. Collene Mares: diverticulosis.  Recall 2024.  ? TOTAL ABDOMINAL HYSTERECTOMY W/ BILATERAL SALPINGOOPHORECTOMY  approx 2006  ? Nonmalignant reasons  ? TRANSTHORACIC ECHOCARDIOGRAM  09/19/2018  ? EF 60-65%, mild LVH, Grd I DD.  ? ?Social History  ? ?Socioeconomic History  ? Marital status: Married  ?  Spouse name: Not on file  ? Number of children: Not on file  ?  Years of education: Not on file  ? Highest education level: Not on file  ?Occupational History  ? Not on file  ?Tobacco Use  ? Smoking status: Never  ?  Passive exposure: Past  ? Smokeless tobacco: Never  ?Vaping Use  ? Vaping Use: Never used  ?Substance and Sexual Activity  ? Alcohol use: No  ?  Comment: rarely  ? Drug use: No  ? Sexual activity: Yes  ?  Partners: Male  ?  Birth control/protection: None  ?Other Topics Concern  ? Not on file  ?Social History Narrative  ? Married 26 yrs, 3 children (1 daught, 2 sons).  ? Orig from [place].  ? 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  ? No tobacco, very rare alcohol, no drugs.  ?   ?   ? ?Social Determinants of Health  ? ?Financial Resource Strain: Not on file  ?Food Insecurity: Not on file  ?Transportation Needs: Not on file  ?Physical Activity: Not on file  ?Stress: Not on file  ?Social Connections: Not on file  ? ?Family History  ?Problem Relation Age of Onset  ? Arthritis Mother   ?     osteo arithritis  ? Obesity Mother   ? Fibroids Mother   ?  uterine  ? Lymphoma Father   ? Sarcoidosis Father   ? Pulmonary fibrosis Father   ? Celiac disease Sister   ? Neuropathy Brother   ? Arthritis Maternal Grandmother   ?     rheumatoid  ? Cancer Maternal Grandmother   ?     cervical  ? Heart disease Maternal Grandfather   ?     a-fib  ? COPD Maternal Grandfather   ?     emphysema  ? Vision loss Paternal Grandmother   ? Emphysema Paternal Grandfather   ? Lung cancer Paternal Grandfather   ? Cancer Daughter   ?     ovarian  ? Asthma Son   ? Breast cancer Neg Hx   ? ?Allergies  ?Allergen Reactions  ? Aspirin Shortness Of Breath and Other (See Comments)  ?  nasal congestion  ? Nsaids Anaphylaxis  ? Sulfa Antibiotics Rash  ? ?Prior to Admission medications   ?Medication Sig Start Date End Date Taking? Authorizing Provider  ?acetaminophen (TYLENOL) 500 MG tablet Take 1,000 mg by mouth every 8 (eight) hours as needed for moderate pain.    Yes [provider]  ?Coenzyme Q10 (COQ10 PO) Take 1 capsule by mouth daily.   Yes [provider]  ?Cyanocobalamin (VITAMIN B-12 PO) Take 1 tablet by mouth 3 (three) times a week.   Yes [provider]  ?DULoxetine (CYMBALTA) 60 MG capsule TAKE 1 CAPSULE BY MOUTH EVERY DAY ?Patient taking differently: Take 60 mg by mouth at bedtime. 04/11/21  Yes McGowen, Adrian Blackwater, MD  ?fenofibrate 160 MG tablet TAKE 1 TABLET BY MOUTH EVERY DAY 09/30/21  Yes McGowen, Adrian Blackwater, MD  ?levothyroxine (SYNTHROID) 200 MCG tablet Take 1 tablet (200 mcg total) by mouth daily before breakfast. 04/11/21  Yes McGowen, Adrian Blackwater, MD  ?lidocaine (LIDODERM) 5 % Place 1 patch onto the skin daily. Remove & Discard patch within 12 hours or as directed by MD   Yes [provider]  ?metoprolol succinate (TOPROL-XL) 25 MG 24 hr tablet Take 12.5 mg by mouth daily. May take an additional 12.5 - 25 mg for irregular heart rate 09/09/21  Yes [provider]  ?pregabalin (LYRICA) 150 MG capsule 1 cap po bid ?Patient taking differently: Take 150 mg by mouth at bedtime. Pt sometimes takes 150 mg in the morning for pain with fibromyalgia 11/13/21  Yes McGowen, Adrian Blackwater, MD  ?Vitamin D, Ergocalciferol, (DRISDOL) 1.25 MG (50000 UNIT) CAPS capsule TAKE 1 CAPSULE BY MOUTH TWICE WEEKLY 09/04/21  Yes McGowen, Adrian Blackwater, MD  ?hydrochlorothiazide (HYDRODIURIL) 25 MG tablet Take 0.5 tablets (12.5 mg total) by mouth daily. ?Patient not taking: Reported on 11/18/2021 09/30/21   Tammi Sou, MD  ?OVER THE COUNTER MEDICATION Take 1 tablet by mouth daily. Cytokine Suppress    [provider]  ?OVER THE COUNTER MEDICATION Take 1 capsule by mouth daily. Curcumin Elite    [provider]  ? ?No results found. ? ?Positive ROS: All other systems have been reviewed and were otherwise negative with the exception of those mentioned in the HPI and as above. ? ?Physical Exam: ?General: Alert, no acute distress ?Cardiovascular:  No pedal edema ?Respiratory: No cyanosis, no use of accessory musculature ?GI: No organomegaly, abdomen is soft and non-tender ?Skin: No lesions in the area of chief complaint ?Neurologic: Sensation intact distally ?Psychiatric: Patient is competent for consent with normal mood and affect ?Lymphatic: No axillary or cervical lymphadenopathy ? ?MUSCULOSKELETAL: Left upper extremity is warm and  well-perfused with no open wounds or lesions.  She is neurovascular intact ? ?Assessment: ?1.  Left shoulder rotator cuff tear, complete ?2.  Left shoulder subacromial impingement ?3.  Left shoulder superior labrum tear ?4.  Left shoulder proximal biceps tendinitis ? ?Plan: ?-Plans to proceed today with arthroscopic intervention on the left shoulder.  We discussed again arthroscopic rotator cuff repair with extensive debridement and subacromial decompression.  We discussed the risk and benefits of the procedure in detail.  Discussed the risk of bleeding, infection, damage to surrounding nerves and vessels, stiffness, failure of repairs, need for further surgery, and she has provided informed consent. ? ?-Plan for discharge home today postoperatively. ? ? ? ?Nicholes Stairs, MD ?Cell [phone]  ? ? ?12/12/2021 ?7:17 AM ? ?

## 2021-12-12 NOTE — Transfer of Care (Signed)
q`Immediate Anesthesia Transfer of Care Note ? ?Patient: Brenda Schroeder ? ?Procedure(s) Performed: Left shoulder arthroscopy with rotator cuff repair, extensive debridement and subacromial decompression (Left) ? ?Patient Location: PACU ? ?Anesthesia Type:General ? ?Level of Consciousness: drowsy ? ?Airway & Oxygen Therapy: Patient Spontanous Breathing ? ?Post-op Assessment: Report given to RN and Post -op Vital signs reviewed and stable ? ?Post vital signs: Reviewed and stable ? ?Last Vitals:  ?Vitals Value Taken Time  ?BP 129/76 12/12/21 0927  ?Temp    ?Pulse 69 12/12/21 0929  ?Resp 21 12/12/21 0929  ?SpO2 93 % 12/12/21 0929  ?Vitals shown include unvalidated device data. ? ?Last Pain:  ?Vitals:  ? 12/12/21 0625  ?TempSrc:   ?PainSc: 8   ?   ? ?  ? ?Complications: No notable events documented. ?

## 2021-12-12 NOTE — Op Note (Signed)
Date of Surgery: 12/12/2021 ? ?INDICATIONS: Brenda Schroeder is a 61 y.o.-year-old female with a left shoulder complete rotator cuff tear as well as impingement and biceps symptoms.  Here today for arthroscopic management.;  The patient did consent to the procedure after discussion of the risks and benefits. ? ?PREOPERATIVE DIAGNOSIS:  ?1.  Left shoulder proximal biceps tendinitis ?2.  Left shoulder rotator cuff tear complete ?3.  Left shoulder impingement ?4.  Left shoulder degenerative labral tear anterior, and superior as well as posterior ? ?POSTOPERATIVE DIAGNOSIS: Same. ? ?PROCEDURE:  ?1.  Left shoulder arthroscopic extensive debridement including rotator interval tissue, anterior labrum, superior labrum, and glenoid fossa ?2.  Left shoulder arthroscopic rotator cuff repair of complete tear and double row technique ?3.  Left shoulder arthroscopic subacromial decompression pression with coracoacromial ligament resection ?4.  Left shoulder arthroscopic biceps tenotomy ? ?SURGEON: Geralynn Rile, M.D. ? ?ASSIST: Jonelle Sidle, PA-C ? ?Assistant attestation: ? ?PA Mcclung was present for the entire procedure.  He participated in all portions. ? ?ANESTHESIA:  general, and regional ? ?IV FLUIDS AND URINE: See anesthesia. ? ?ESTIMATED BLOOD LOSS: 10 mL. ? ?IMPLANTS:  ?Arthrex 2.6 mm all suture knotless fiber tack x 2  ?Arthrex 4.75 mm swivel lock knotless anchor x 2 ? ?DRAINS: None ? ?COMPLICATIONS: None. ? ?DESCRIPTION OF PROCEDURE: The patient was brought to the operating room and placed supine on the operating table.  The patient had been signed prior to the procedure and this was documented. The patient had the anesthesia placed by the anesthesiologist.  A time-out was performed to confirm that this was the correct patient, site, side and location. The patient did receive antibiotics prior to the incision and was re-dosed during the procedure as needed at indicated intervals.  A tourniquet was not placed.  The patient  had the operative extremity prepped and draped in the standard surgical fashion.    ? ? ?After obtaining informed consent the patient was brought to the operating table and underwent satisfactory anesthesia. An exam under anesthesia revealed full range of motion.  She was placed in the right lateral decubitus position with an axillary roll and all bony prominences properly padded. A standard surgical timeout was performed.  Gentle inline traction weight was placed on the arm.   ? ?Standard posterior and anterior superior portals were established. A diagnostic evaluation of the glenohumeral joint was performed. The biceps tendon was markedly synovitic and partially torn.  It was quite flattened just off of the superior labrum it was tenotomized with the radiofrequency wand.. An extensive debridement was performed of the superior anterior and posterior labrum, and the rotator interval was widely excised. The articular surfaces revealed no significant chondromalacia . The subscapularis was intact. The rotator cuff was torn and the greater tuberosity footprint was prepared for repair. Releases were performed on the capsular surface of the rotator cuff.  The teres minor and infraspinatus were intact.  No loose bodies in the joint. ? ?The arthroscope was inserted in the subacromial space and an additional lateral portal was established.  ? ?Assess for portals were established to work around the rotator cuff tear due to the patient's body habitus.  We then established a lateral portal just about 4 cm lateral to the lateral edge of the acromion.  We then moved our viewing portal to the portal of [place] for better visualization. ? ?An acromioplasty performed nicely decompressing the subacromial space with a motorized burr.  We used a cutting block technique while viewing  from lateral and working from posterior to create a nice type I flat acromion.   ? ?Next cannulas were placed appropriately.  We then placed 2 Medial  Row anchors percutaneously.  We used 2.6 mm knotless fiber tack anchors preloaded with a Arthrex fiber tape suture.  These were placed at the articular margin.  We then used a scorpion antegrade suture passing device to place suture passes at the tendon muscle junction.  This anterior and posterior.  We then divided these limbs so that we had 4 limbs coming through the medial row.  We then loaded these into 2 separate lateral row anchors.  We utilized a 4.75 mm Arthrex over lock anchors for the lateral row.  First placed in the posterior lateral row anchor with good compression of the posterior half of the repair and then we placed the anterior lateral row anchor which completed our repair and a transosseous equivalent fashion.  We used the knotless mechanism from a anterior anchor to address the posterior dogear that did cinched down nicely with this #2 suture.  At the completion of the repair we had good tendon bone communication that moved in 1 unit. ? ?The arthroscope was then removed and portals closed with 3-0 Monocryl in standard fashion followed by a sterile occlusive dressing Polar Care ice sleeve and a slingshot sling. The patient was sent to recovery in stable condition and tolerated the procedure well ? ?POSTOPERATIVE PLAN: Brenda Schroeder will be nonweightbearing to the operative extremity.  She is in her sling for approximately 5 weeks.  She will use the shoulder sling with the waist immobilizer to prevent hyperextension or external rotation or abduction.  She will be discharged home today from PACU.  She will follow-up with me in the office in 2 weeks. ? ? ?

## 2021-12-12 NOTE — Anesthesia Procedure Notes (Addendum)
Anesthesia Regional Block: Interscalene brachial plexus block  ? ?Pre-Anesthetic Checklist: , timeout performed,  Correct Patient, Correct Site, Correct Laterality,  Correct Procedure, Correct Position, site marked,  Risks and benefits discussed,  Surgical consent,  Pre-op evaluation,  At surgeon's request and post-op pain management ? ?Laterality: Left and Upper ? ?Prep: chloraprep     ?  ?Needles:  ?Injection technique: Single-shot ? ?Needle Type: Echogenic Stimulator Needle   ? ? ?Needle Length: 9cm  ?Needle Gauge: 20  ? ?Needle insertion depth: 2 cm ? ? ?Additional Needles: ? ? ?Procedures:,,,, ultrasound used (permanent image in chart),,    ?Narrative:  ?Start time: 12/12/2021 7:15 AM ?End time: 12/12/2021 7:30 AM ?Injection made incrementally with aspirations every 5 mL. ? ?Performed by: Personally  ?Anesthesiologist: Leilani Able, MD ? ? ? ? ?

## 2021-12-12 NOTE — Anesthesia Postprocedure Evaluation (Signed)
Anesthesia Post Note ? ?Patient: KYLYNN ONUFER ? ?Procedure(s) Performed: Left shoulder arthroscopy with rotator cuff repair, extensive debridement and subacromial decompression (Left) ? ?  ? ?Patient location during evaluation: PACU ?Anesthesia Type: General ?Level of consciousness: awake and sedated ?Pain management: pain level controlled ?Vital Signs Assessment: post-procedure vital signs reviewed and stable ?Respiratory status: spontaneous breathing ?Cardiovascular status: stable ?Postop Assessment: no apparent nausea or vomiting ?Anesthetic complications: no ? ? ?No notable events documented. ? ?Last Vitals:  ?Vitals:  ? 12/12/21 0945 12/12/21 1000  ?BP: 136/80 (!) 150/82  ?Pulse: 61 (!) 59  ?Resp: 17 10  ?Temp:    ?SpO2: 96% 99%  ?  ?Last Pain:  ?Vitals:  ? 12/12/21 0930  ?TempSrc:   ?PainSc: 0-No pain  ? ? ?  ?  ?  ?  ?  ?  ? ?Huston Foley ? ? ? ? ?

## 2021-12-12 NOTE — Anesthesia Procedure Notes (Signed)
Procedure Name: Intubation ?Date/Time: 12/12/2021 7:52 AM ?Performed by: Lynnell Chad, CRNA ?Pre-anesthesia Checklist: Patient identified, Emergency Drugs available, Suction available and Patient being monitored ?Patient Re-evaluated:Patient Re-evaluated prior to induction ?Oxygen Delivery Method: Circle System Utilized ?Preoxygenation: Pre-oxygenation with 100% oxygen ?Induction Type: IV induction ?Ventilation: Mask ventilation without difficulty ?Laryngoscope Size: Hyacinth Meeker and 2 ?Grade View: Grade I ?Tube type: Oral ?Tube size: 7.0 mm ?Number of attempts: 1 ?Airway Equipment and Method: Stylet and Oral airway ?Placement Confirmation: ETT inserted through vocal cords under direct vision, positive ETCO2 and breath sounds checked- equal and bilateral ?Secured at: 21 cm ?Tube secured with: Tape ?Dental Injury: Teeth and Oropharynx as per pre-operative assessment  ? ? ? ? ?

## 2021-12-16 ENCOUNTER — Ambulatory Visit: Payer: BC Managed Care – PPO | Admitting: Internal Medicine

## 2021-12-16 ENCOUNTER — Encounter (HOSPITAL_COMMUNITY): Payer: Self-pay | Admitting: Orthopedic Surgery

## 2021-12-19 ENCOUNTER — Encounter: Payer: Self-pay | Admitting: Family Medicine

## 2021-12-19 ENCOUNTER — Ambulatory Visit: Payer: BC Managed Care – PPO | Admitting: Rheumatology

## 2021-12-26 DIAGNOSIS — Z9889 Other specified postprocedural states: Secondary | ICD-10-CM | POA: Diagnosis not present

## 2021-12-26 DIAGNOSIS — M25512 Pain in left shoulder: Secondary | ICD-10-CM | POA: Diagnosis not present

## 2021-12-30 DIAGNOSIS — M25512 Pain in left shoulder: Secondary | ICD-10-CM | POA: Diagnosis not present

## 2021-12-30 DIAGNOSIS — Z9889 Other specified postprocedural states: Secondary | ICD-10-CM | POA: Diagnosis not present

## 2022-01-02 DIAGNOSIS — Z9889 Other specified postprocedural states: Secondary | ICD-10-CM | POA: Diagnosis not present

## 2022-01-02 DIAGNOSIS — M25512 Pain in left shoulder: Secondary | ICD-10-CM | POA: Diagnosis not present

## 2022-01-07 DIAGNOSIS — M170 Bilateral primary osteoarthritis of knee: Secondary | ICD-10-CM | POA: Diagnosis not present

## 2022-01-07 DIAGNOSIS — M25511 Pain in right shoulder: Secondary | ICD-10-CM | POA: Diagnosis not present

## 2022-01-08 ENCOUNTER — Other Ambulatory Visit: Payer: Self-pay | Admitting: Family Medicine

## 2022-01-08 DIAGNOSIS — Z9889 Other specified postprocedural states: Secondary | ICD-10-CM | POA: Diagnosis not present

## 2022-01-08 DIAGNOSIS — M25512 Pain in left shoulder: Secondary | ICD-10-CM | POA: Diagnosis not present

## 2022-01-08 NOTE — Telephone Encounter (Signed)
RF request for Lyrica ?LOV: 11/13/21 ?Next ov: 02/13/22 ?Last written: 11/13/21(90,0) ? ? ?Please review and advise. Med pending  ?

## 2022-01-10 DIAGNOSIS — Z9889 Other specified postprocedural states: Secondary | ICD-10-CM | POA: Diagnosis not present

## 2022-01-10 DIAGNOSIS — M25512 Pain in left shoulder: Secondary | ICD-10-CM | POA: Diagnosis not present

## 2022-01-22 ENCOUNTER — Other Ambulatory Visit: Payer: Self-pay | Admitting: Family Medicine

## 2022-01-23 DIAGNOSIS — Z9889 Other specified postprocedural states: Secondary | ICD-10-CM | POA: Diagnosis not present

## 2022-01-23 DIAGNOSIS — M25512 Pain in left shoulder: Secondary | ICD-10-CM | POA: Diagnosis not present

## 2022-01-26 DIAGNOSIS — Z6841 Body Mass Index (BMI) 40.0 and over, adult: Secondary | ICD-10-CM | POA: Diagnosis not present

## 2022-01-26 DIAGNOSIS — I10 Essential (primary) hypertension: Secondary | ICD-10-CM | POA: Diagnosis not present

## 2022-01-26 DIAGNOSIS — Z882 Allergy status to sulfonamides status: Secondary | ICD-10-CM | POA: Diagnosis not present

## 2022-01-26 DIAGNOSIS — E785 Hyperlipidemia, unspecified: Secondary | ICD-10-CM | POA: Diagnosis not present

## 2022-01-26 DIAGNOSIS — R319 Hematuria, unspecified: Secondary | ICD-10-CM | POA: Diagnosis not present

## 2022-01-26 DIAGNOSIS — N3091 Cystitis, unspecified with hematuria: Secondary | ICD-10-CM | POA: Diagnosis not present

## 2022-01-26 DIAGNOSIS — Z79899 Other long term (current) drug therapy: Secondary | ICD-10-CM | POA: Diagnosis not present

## 2022-01-26 DIAGNOSIS — Z886 Allergy status to analgesic agent status: Secondary | ICD-10-CM | POA: Diagnosis not present

## 2022-01-27 DIAGNOSIS — Z9889 Other specified postprocedural states: Secondary | ICD-10-CM | POA: Diagnosis not present

## 2022-01-27 DIAGNOSIS — M25512 Pain in left shoulder: Secondary | ICD-10-CM | POA: Diagnosis not present

## 2022-01-29 DIAGNOSIS — M170 Bilateral primary osteoarthritis of knee: Secondary | ICD-10-CM | POA: Diagnosis not present

## 2022-01-29 DIAGNOSIS — M25561 Pain in right knee: Secondary | ICD-10-CM | POA: Diagnosis not present

## 2022-01-29 DIAGNOSIS — M25562 Pain in left knee: Secondary | ICD-10-CM | POA: Diagnosis not present

## 2022-01-30 DIAGNOSIS — M25512 Pain in left shoulder: Secondary | ICD-10-CM | POA: Diagnosis not present

## 2022-01-30 DIAGNOSIS — Z9889 Other specified postprocedural states: Secondary | ICD-10-CM | POA: Diagnosis not present

## 2022-02-05 DIAGNOSIS — M25512 Pain in left shoulder: Secondary | ICD-10-CM | POA: Diagnosis not present

## 2022-02-05 DIAGNOSIS — Z9889 Other specified postprocedural states: Secondary | ICD-10-CM | POA: Diagnosis not present

## 2022-02-07 DIAGNOSIS — Z9889 Other specified postprocedural states: Secondary | ICD-10-CM | POA: Diagnosis not present

## 2022-02-07 DIAGNOSIS — M25512 Pain in left shoulder: Secondary | ICD-10-CM | POA: Diagnosis not present

## 2022-02-10 ENCOUNTER — Encounter: Payer: Self-pay | Admitting: Family Medicine

## 2022-02-13 ENCOUNTER — Telehealth: Payer: Self-pay

## 2022-02-13 ENCOUNTER — Ambulatory Visit: Payer: BC Managed Care – PPO | Admitting: Family Medicine

## 2022-02-13 NOTE — Telephone Encounter (Signed)
Noted  

## 2022-02-13 NOTE — Progress Notes (Deleted)
OFFICE VISIT  02/13/2022  CC: No chief complaint on file.  Patient is a 61 y.o. female who presents for 3 mo f/u HTN and fibromyalgia. A/P as of last visit: "#1 hypertension, with low blood pressure readings lately. We will discontinue her HCTZ completely. Electrolytes and creatinine today.  Continue Toprol-XL 12.5 mg a day.  2.  Fibromyalgia syndrome. She is getting some benefit from Lyrica 150 mg, 1-2 every day. She is going to get specialist evaluation on 11/18/2021 with Dr. Dimple Casey, rheumatologist   #3 palpitations.  These seem to have calmed down but if they recur her cardiologist Dr. Andrey Farmer is going to do electrophysiology study with the aim of pursuing ablation."  INTERIM HX: ***  PMP AWARE reviewed today: most recent rx for Lyrica 150 mg was filled 01/23/2022, #180, rx by me. No red flags.  Past Medical History:  Diagnosis Date   Allergy    Anxiety    Asthma    Chronic low back pain    DDD, DJD lumbar multilevel   Elevated transaminase level 06/2021   very mild, onset after starting fenofibrate. Staying on fenofibrate, getting RUQ abd u/s, and repeating labs 1 mo. Viral hep serologies neg.   Fibromyalgia 2017   Herniated disc L4-L5   High triglycerides    mild.  TLC.   History of kidney stones    Hypertension    Hypothyroidism    IBS (irritable bowel syndrome)    Internal hemorrhoids    Neuropathic pain    Rx'd neurontin by her orthopedist   Osteoarthritis of both knees    Viscosupp started 2023 ([facility])   Palpitations 09/2018   toprol xl started by cardiologist->no tachyarrhythmia on telemetry   Sleep apnea    Sprain of posterior cruciate ligament of knee right knee   Vitamin D deficiency 02/2017   high dose vit D started 02/23/17--vit D still low on recheck.  Needs 50K twice per week dosing indefinitely. Vit D level mid 40s Nov 2019.    Past Surgical History:  Procedure Laterality Date   CARDIOVASCULAR STRESS TEST  09/19/2018   Low risk   COLONOSCOPY W/  POLYPECTOMY  09/2012   Dr. Loreta Ave: diverticulosis.  Recall 2024.   SHOULDER ARTHROSCOPY WITH ROTATOR CUFF REPAIR AND SUBACROMIAL DECOMPRESSION Left 12/12/2021   Procedure: Left shoulder arthroscopy with rotator cuff repair, extensive debridement and subacromial decompression;  Surgeon: Yolonda Kida, MD;  Location: [facility] OR;  Service: Orthopedics;  Laterality: Left;  2 hrs   TOTAL ABDOMINAL HYSTERECTOMY W/ BILATERAL SALPINGOOPHORECTOMY  approx 2006   Nonmalignant reasons   TRANSTHORACIC ECHOCARDIOGRAM  09/19/2018   EF 60-65%, mild LVH, Grd I DD.    Outpatient Medications Prior to Visit  Medication Sig Dispense Refill   acetaminophen (TYLENOL) 500 MG tablet Take 1,000 mg by mouth every 8 (eight) hours as needed for moderate pain.     Coenzyme Q10 (COQ10 PO) Take 1 capsule by mouth daily.     Cyanocobalamin (VITAMIN B-12 PO) Take 1 tablet by mouth 3 (three) times a week.     DULoxetine (CYMBALTA) 60 MG capsule TAKE 1 CAPSULE BY MOUTH EVERY DAY (Patient taking differently: Take 60 mg by mouth at bedtime.) 90 capsule 3   fenofibrate 160 MG tablet TAKE 1 TABLET BY MOUTH EVERY DAY 90 tablet 3   hydrochlorothiazide (HYDRODIURIL) 25 MG tablet Take 0.5 tablets (12.5 mg total) by mouth daily. (Patient not taking: Reported on 11/18/2021) 45 tablet 3   levothyroxine (SYNTHROID) 200 MCG tablet Take 1  tablet (200 mcg total) by mouth daily before breakfast. 90 tablet 3   lidocaine (LIDODERM) 5 % Place 1 patch onto the skin daily. Remove & Discard patch within 12 hours or as directed by MD     metoprolol succinate (TOPROL-XL) 25 MG 24 hr tablet Take 12.5 mg by mouth daily. May take an additional 12.5 - 25 mg for irregular heart rate     ondansetron (ZOFRAN) 4 MG tablet Take 1 tablet (4 mg total) by mouth every 8 (eight) hours as needed for nausea or vomiting. 30 tablet 1   OVER THE COUNTER MEDICATION Take 1 tablet by mouth daily. Cytokine Suppress     OVER THE COUNTER MEDICATION Take 1 capsule by mouth daily.  Curcumin Elite     oxyCODONE (ROXICODONE) 5 MG immediate release tablet Take 1 tablet (5 mg total) by mouth every 4 (four) hours as needed for severe pain or breakthrough pain. 22 tablet 0   pregabalin (LYRICA) 150 MG capsule Take 1 capsule (150 mg total) by mouth 2 (two) times daily. 180 capsule 0   Vitamin D, Ergocalciferol, (DRISDOL) 1.25 MG (50000 UNIT) CAPS capsule TAKE 1 CAPSULE BY MOUTH TWICE WEEKLY 24 capsule 2   No facility-administered medications prior to visit.    Allergies  Allergen Reactions   Aspirin Shortness Of Breath and Other (See Comments)    nasal congestion   Nsaids Anaphylaxis   Sulfa Antibiotics Rash    ROS As per HPI  PE:    12/12/2021   10:00 AM 12/12/2021    9:45 AM 12/12/2021    9:30 AM  Vitals with BMI  Systolic [id number]  Diastolic 82 80 76  Pulse 59 61 66    Physical Exam  ***  LABS:  Last CBC Lab Results  Component Value Date   WBC 6.3 12/05/2021   HGB 13.1 12/05/2021   HCT 41.2 12/05/2021   MCV 88.8 12/05/2021   MCH 28.2 12/05/2021   RDW 13.3 12/05/2021   PLT 311 [id number]   Last metabolic panel Lab Results  Component Value Date   GLUCOSE 91 12/05/2021   NA 142 12/05/2021   K 3.8 12/05/2021   CL 110 12/05/2021   CO2 25 12/05/2021   BUN 17 12/05/2021   CREATININE 1.02 (H) 12/05/2021   GFRNONAA >60 12/05/2021   CALCIUM 9.4 12/05/2021   PROT 6.7 07/26/2021   ALBUMIN 4.4 07/26/2021   BILITOT 0.6 07/26/2021   ALKPHOS 38 (L) 07/26/2021   AST 28 07/26/2021   ALT 37 (H) 07/26/2021   ANIONGAP 7 12/05/2021   Last lipids Lab Results  Component Value Date   CHOL 170 07/26/2021   HDL 50.20 07/26/2021   LDLCALC 76 10/05/2013   LDLDIRECT 49.0 07/26/2021   TRIG 244.0 (H) 07/26/2021   CHOLHDL 3 07/26/2021   Last hemoglobin A1c Lab Results  Component Value Date   HGBA1C 5.3 09/19/2018   Last thyroid functions Lab Results  Component Value Date   TSH 0.32 (L) 04/11/2021   T3TOTAL 84 01/01/2018   Last vitamin D Lab  Results  Component Value Date   VD25OH 50.92 04/11/2021   Last vitamin B12 and Folate Lab Results  Component Value Date   VITAMINB12 >1550 (H) 04/11/2021   IMPRESSION AND PLAN:  No problem-specific Assessment & Plan notes found for this encounter.   An After Visit Summary was printed and given to the patient.  FOLLOW UP: No follow-ups on file. Next cpe 04/2022  Signed:  Crissie Sickles, MD  02/13/2022  

## 2022-02-13 NOTE — Telephone Encounter (Signed)
noted 

## 2022-02-13 NOTE — Telephone Encounter (Signed)
No Show/Cancel within 24 hours  pt called at 8:15am to cancel 9am appt - pt is back at work, was not able to leave work, resch appt 6/19, pt aware n/s fee / dnh

## 2022-02-17 ENCOUNTER — Encounter: Payer: Self-pay | Admitting: Family Medicine

## 2022-02-17 DIAGNOSIS — M25512 Pain in left shoulder: Secondary | ICD-10-CM | POA: Diagnosis not present

## 2022-02-17 DIAGNOSIS — Z9889 Other specified postprocedural states: Secondary | ICD-10-CM | POA: Diagnosis not present

## 2022-02-24 ENCOUNTER — Encounter: Payer: Self-pay | Admitting: Family Medicine

## 2022-02-24 ENCOUNTER — Ambulatory Visit (INDEPENDENT_AMBULATORY_CARE_PROVIDER_SITE_OTHER): Payer: BC Managed Care – PPO | Admitting: Family Medicine

## 2022-02-24 VITALS — BP 112/77 | HR 58 | Temp 98.0°F | Ht 63.0 in | Wt 302.0 lb

## 2022-02-24 DIAGNOSIS — Z9889 Other specified postprocedural states: Secondary | ICD-10-CM | POA: Diagnosis not present

## 2022-02-24 DIAGNOSIS — E781 Pure hyperglyceridemia: Secondary | ICD-10-CM

## 2022-02-24 DIAGNOSIS — E039 Hypothyroidism, unspecified: Secondary | ICD-10-CM

## 2022-02-24 DIAGNOSIS — M797 Fibromyalgia: Secondary | ICD-10-CM

## 2022-02-24 DIAGNOSIS — I10 Essential (primary) hypertension: Secondary | ICD-10-CM | POA: Diagnosis not present

## 2022-02-24 DIAGNOSIS — M25512 Pain in left shoulder: Secondary | ICD-10-CM | POA: Diagnosis not present

## 2022-02-24 LAB — COMPREHENSIVE METABOLIC PANEL
ALT: 20 U/L (ref 0–35)
AST: 18 U/L (ref 0–37)
Albumin: 3.9 g/dL (ref 3.5–5.2)
Alkaline Phosphatase: 34 U/L — ABNORMAL LOW (ref 39–117)
BUN: 19 mg/dL (ref 6–23)
CO2: 32 mEq/L (ref 19–32)
Calcium: 9.5 mg/dL (ref 8.4–10.5)
Chloride: 106 mEq/L (ref 96–112)
Creatinine, Ser: 0.95 mg/dL (ref 0.40–1.20)
GFR: 65.3 mL/min (ref >60.00)
Glucose, Bld: 94 mg/dL (ref 70–99)
Potassium: 3.8 mEq/L (ref 3.5–5.1)
Sodium: 143 mEq/L (ref 135–145)
Total Bilirubin: 0.5 mg/dL (ref 0.2–1.2)
Total Protein: 6 g/dL (ref 6.0–8.3)

## 2022-02-24 LAB — LIPID PANEL
Cholesterol: 128 mg/dL (ref 0–200)
HDL: 54.6 mg/dL (ref >39.00)
LDL Cholesterol: 50 mg/dL (ref 0–99)
NonHDL: 73.09
Total CHOL/HDL Ratio: 2
Triglycerides: 113 mg/dL (ref 0.0–149.0)
VLDL: 22.6 mg/dL (ref 0.0–40.0)

## 2022-02-24 LAB — TSH: TSH: 0.68 u[IU]/mL (ref 0.35–5.50)

## 2022-02-24 NOTE — Progress Notes (Signed)
OFFICE VISIT  02/24/2022  CC:  Chief Complaint  Patient presents with   Hypertension   Hyperlipidemia    Pt is fasting   HPI:    Patient is a 61 y.o. female who presents for 3 mo f/u history of HTN, fibromyalgia, hypertriglyceridemia. A/P as of last visit: "#1 hypertension, with low blood pressure readings lately. We will discontinue her HCTZ completely. Electrolytes and creatinine today.  Continue Toprol-XL 12.5 mg a day.  2.  Fibromyalgia syndrome. She is getting some benefit from Lyrica 150 mg, 1-2 every day. She is going to get specialist evaluation on 11/18/2021 with Dr. Dimple Casey, rheumatologist   #3 palpitations.  These seem to have calmed down but if they recur her cardiologist Dr. Andrey Farmer is going to do electrophysiology study with the aim of pursuing ablation."  INTERIM HX: Brenda Schroeder is doing okay. No home blood pressure monitoring.  She ended up staying on HCTZ.  She got left shoulder surgery recently and is in the midst of rehab, says she is doing better.  She got steroid injections in both knees about 3 weeks ago. She likely will get bilateral total knee replacement in the future.  She is trying to eat significantly lower calorie meals.  Exercise is severely limited due to her knee pain.  Her fibromyalgia pain waxes and wanes, she takes Lyrica 150 mg tab every night and sometimes during the day.  ROS as above, plus--> no fevers, no CP, no SOB, no wheezing, no cough, no dizziness, no HAs, no rashes, no melena/hematochezia.  No polyuria or polydipsia.   No focal weakness, paresthesias, or tremors.  No acute vision or hearing abnormalities.  No dysuria or unusual/new urinary urgency or frequency.  No recent changes in lower legs. No n/v/d or abd pain.  No palpitations.     Past Medical History:  Diagnosis Date   Allergy    Anxiety    Asthma    Chronic low back pain    DDD, DJD lumbar multilevel   Elevated transaminase level 06/2021   very mild, onset after starting  fenofibrate. Staying on fenofibrate, getting RUQ abd u/s, and repeating labs 1 mo. Viral hep serologies neg.   Fibromyalgia 2017   Herniated disc L4-L5   High triglycerides    mild.  TLC.   History of kidney stones    Hypertension    Hypothyroidism    IBS (irritable bowel syndrome)    Internal hemorrhoids    Neuropathic pain    Rx'd neurontin by her orthopedist   Osteoarthritis of both knees    Viscosupp started 2023 ([facility])   Palpitations 09/2018   toprol xl started by cardiologist->no tachyarrhythmia on telemetry   Sleep apnea    Sprain of posterior cruciate ligament of knee right knee   Vitamin D deficiency 02/2017   high dose vit D started 02/23/17--vit D still low on recheck.  Needs 50K twice per week dosing indefinitely. Vit D level mid 40s Nov 2019.    Past Surgical History:  Procedure Laterality Date   CARDIOVASCULAR STRESS TEST  09/19/2018   Low risk   COLONOSCOPY W/ POLYPECTOMY  09/2012   Dr. Loreta Ave: diverticulosis.  Recall 2024.   SHOULDER ARTHROSCOPY WITH ROTATOR CUFF REPAIR AND SUBACROMIAL DECOMPRESSION Left 12/12/2021   Procedure: Left shoulder arthroscopy with rotator cuff repair, extensive debridement and subacromial decompression;  Surgeon: Yolonda Kida, MD;  Location: [facility] OR;  Service: Orthopedics;  Laterality: Left;  2 hrs   TOTAL ABDOMINAL HYSTERECTOMY W/ BILATERAL SALPINGOOPHORECTOMY  approx 2006   Nonmalignant reasons   TRANSTHORACIC ECHOCARDIOGRAM  09/19/2018   EF 60-65%, mild LVH, Grd I DD.    Outpatient Medications Prior to Visit  Medication Sig Dispense Refill   Coenzyme Q10 (COQ10 PO) Take 1 capsule by mouth daily.     Cyanocobalamin (VITAMIN B-12 PO) Take 1 tablet by mouth 3 (three) times a week.     DULoxetine (CYMBALTA) 60 MG capsule TAKE 1 CAPSULE BY MOUTH EVERY DAY (Patient taking differently: Take 60 mg by mouth at bedtime.) 90 capsule 3   fenofibrate 160 MG tablet TAKE 1 TABLET BY MOUTH EVERY DAY 90 tablet 3   hydrochlorothiazide  (HYDRODIURIL) 25 MG tablet Take 0.5 tablets (12.5 mg total) by mouth daily. 45 tablet 3   levothyroxine (SYNTHROID) 200 MCG tablet Take 1 tablet (200 mcg total) by mouth daily before breakfast. 90 tablet 3   lidocaine (LIDODERM) 5 % Place 1 patch onto the skin daily. Remove & Discard patch within 12 hours or as directed by MD     metoprolol succinate (TOPROL-XL) 25 MG 24 hr tablet Take 12.5 mg by mouth daily. May take an additional 12.5 - 25 mg for irregular heart rate     pregabalin (LYRICA) 150 MG capsule Take 1 capsule (150 mg total) by mouth 2 (two) times daily. 180 capsule 0   Vitamin D, Ergocalciferol, (DRISDOL) 1.25 MG (50000 UNIT) CAPS capsule TAKE 1 CAPSULE BY MOUTH TWICE WEEKLY 24 capsule 2   acetaminophen (TYLENOL) 500 MG tablet Take 1,000 mg by mouth every 8 (eight) hours as needed for moderate pain. (Patient not taking: Reported on 02/24/2022)     ondansetron (ZOFRAN) 4 MG tablet Take 1 tablet (4 mg total) by mouth every 8 (eight) hours as needed for nausea or vomiting. (Patient not taking: Reported on 02/24/2022) 30 tablet 1   OVER THE COUNTER MEDICATION Take 1 tablet by mouth daily. Cytokine Suppress (Patient not taking: Reported on 02/24/2022)     OVER THE COUNTER MEDICATION Take 1 capsule by mouth daily. Curcumin Elite (Patient not taking: Reported on 02/24/2022)     oxyCODONE (ROXICODONE) 5 MG immediate release tablet Take 1 tablet (5 mg total) by mouth every 4 (four) hours as needed for severe pain or breakthrough pain. (Patient not taking: Reported on 02/24/2022) 22 tablet 0   No facility-administered medications prior to visit.    Allergies  Allergen Reactions   Aspirin Shortness Of Breath and Other (See Comments)    nasal congestion   Nsaids Anaphylaxis   Sulfa Antibiotics Rash    ROS As per HPI  PE:    02/24/2022    8:39 AM 12/12/2021   10:00 AM 12/12/2021    9:45 AM  Vitals with BMI  Height 5\' 3"     Weight 302 lbs    BMI 53.51    Systolic 112 150  Diastolic 77  82 80  Pulse 58 59 61   Physical Exam  Gen: Alert, well appearing.  Patient is oriented to person, place, time, and situation. AFFECT: pleasant, lucid thought and speech. CV: RRR, no m/r/g.   LUNGS: CTA bilat, nonlabored resps, good aeration in all lung fields.  LABS:  Last CBC Lab Results  Component Value Date   WBC 6.3 12/05/2021   HGB 13.1 12/05/2021   HCT 41.2 12/05/2021   MCV 88.8 12/05/2021   MCH 28.2 12/05/2021   RDW 13.3 12/05/2021   PLT 311 12/05/2021   Last metabolic panel Lab Results  Component Value Date  GLUCOSE 91 12/05/2021   NA 142 12/05/2021   K 3.8 12/05/2021   CL 110 12/05/2021   CO2 25 12/05/2021   BUN 17 12/05/2021   CREATININE 1.02 (H) 12/05/2021   GFRNONAA >60 12/05/2021   CALCIUM 9.4 12/05/2021   PROT 6.7 07/26/2021   ALBUMIN 4.4 07/26/2021   BILITOT 0.6 07/26/2021   ALKPHOS 38 (L) 07/26/2021   AST 28 07/26/2021   ALT 37 (H) 07/26/2021   ANIONGAP 7 12/05/2021   Last lipids Lab Results  Component Value Date   CHOL 170 07/26/2021   HDL 50.20 07/26/2021   LDLCALC 76 10/05/2013   LDLDIRECT 49.0 07/26/2021   TRIG 244.0 (H) 07/26/2021   CHOLHDL 3 07/26/2021   Last hemoglobin A1c Lab Results  Component Value Date   HGBA1C 5.3 09/19/2018   Last thyroid functions Lab Results  Component Value Date   TSH 0.32 (L) 04/11/2021   T3TOTAL 84 01/01/2018   Last vitamin D Lab Results  Component Value Date   VD25OH 50.92 04/11/2021   Last vitamin B12 and Folate Lab Results  Component Value Date   VITAMINB12 >1550 (H) 04/11/2021   IMPRESSION AND PLAN:  #1 hypertension, well controlled on HCTZ 12.5 mg a day and Toprol-XL 12.5 mg/day. Electrolytes and creatinine today.  2.  Hypertriglyceridemia.  Tolerating fenofibrate 160 mg a day. Lipid and hepatic panel today.  3.  Fibromyalgia.  Overall pretty stable on Lyrica and Cymbalta.  Continue Cymbalta 60 mg a day and Lyrica 150 mg twice daily.  #4 hypothyroidism.  Takes 200 mcg  levothyroxine daily. TSH today.  An After Visit Summary was printed and given to the patient.  FOLLOW UP: Return in about 3 months (around 05/27/2022) for annual CPE (fasting).  Signed:  Santiago Bumpers, MD           02/24/2022

## 2022-02-28 DIAGNOSIS — Z9889 Other specified postprocedural states: Secondary | ICD-10-CM | POA: Diagnosis not present

## 2022-02-28 DIAGNOSIS — M25512 Pain in left shoulder: Secondary | ICD-10-CM | POA: Diagnosis not present

## 2022-03-06 DIAGNOSIS — Z9889 Other specified postprocedural states: Secondary | ICD-10-CM | POA: Diagnosis not present

## 2022-03-06 DIAGNOSIS — M25512 Pain in left shoulder: Secondary | ICD-10-CM | POA: Diagnosis not present

## 2022-03-14 DIAGNOSIS — Z9889 Other specified postprocedural states: Secondary | ICD-10-CM | POA: Diagnosis not present

## 2022-03-14 DIAGNOSIS — M25512 Pain in left shoulder: Secondary | ICD-10-CM | POA: Diagnosis not present

## 2022-03-17 DIAGNOSIS — M25512 Pain in left shoulder: Secondary | ICD-10-CM | POA: Diagnosis not present

## 2022-03-17 DIAGNOSIS — Z9889 Other specified postprocedural states: Secondary | ICD-10-CM | POA: Diagnosis not present

## 2022-03-19 DIAGNOSIS — M25512 Pain in left shoulder: Secondary | ICD-10-CM | POA: Diagnosis not present

## 2022-03-19 DIAGNOSIS — Z9889 Other specified postprocedural states: Secondary | ICD-10-CM | POA: Diagnosis not present

## 2022-03-24 DIAGNOSIS — M25512 Pain in left shoulder: Secondary | ICD-10-CM | POA: Diagnosis not present

## 2022-03-24 DIAGNOSIS — Z9889 Other specified postprocedural states: Secondary | ICD-10-CM | POA: Diagnosis not present

## 2022-03-26 DIAGNOSIS — Z9889 Other specified postprocedural states: Secondary | ICD-10-CM | POA: Diagnosis not present

## 2022-03-26 DIAGNOSIS — M25512 Pain in left shoulder: Secondary | ICD-10-CM | POA: Diagnosis not present

## 2022-03-31 DIAGNOSIS — Z9889 Other specified postprocedural states: Secondary | ICD-10-CM | POA: Diagnosis not present

## 2022-03-31 DIAGNOSIS — M25512 Pain in left shoulder: Secondary | ICD-10-CM | POA: Diagnosis not present

## 2022-04-04 DIAGNOSIS — M25512 Pain in left shoulder: Secondary | ICD-10-CM | POA: Diagnosis not present

## 2022-04-04 DIAGNOSIS — Z9889 Other specified postprocedural states: Secondary | ICD-10-CM | POA: Diagnosis not present

## 2022-04-07 DIAGNOSIS — Z9889 Other specified postprocedural states: Secondary | ICD-10-CM | POA: Diagnosis not present

## 2022-04-07 DIAGNOSIS — M25512 Pain in left shoulder: Secondary | ICD-10-CM | POA: Diagnosis not present

## 2022-04-09 ENCOUNTER — Ambulatory Visit (INDEPENDENT_AMBULATORY_CARE_PROVIDER_SITE_OTHER): Payer: BC Managed Care – PPO | Admitting: Family Medicine

## 2022-04-09 ENCOUNTER — Encounter: Payer: Self-pay | Admitting: Family Medicine

## 2022-04-09 VITALS — BP 102/68 | HR 54 | Temp 98.0°F | Ht 63.0 in | Wt 301.4 lb

## 2022-04-09 DIAGNOSIS — R252 Cramp and spasm: Secondary | ICD-10-CM | POA: Diagnosis not present

## 2022-04-09 MED ORDER — LEVOTHYROXINE SODIUM 200 MCG PO TABS: 200.0000 ug | ORAL_TABLET | Freq: Every day | ORAL | 1 refills | Status: DC

## 2022-04-09 MED ORDER — DULOXETINE HCL 60 MG PO CPEP: ORAL_CAPSULE | ORAL | 1 refills | Status: DC

## 2022-04-09 NOTE — Progress Notes (Signed)
OFFICE VISIT  04/09/2022  CC:  Chief Complaint  Patient presents with   Cramping in legs    Cramps occurring starting at calf and continues into foot. She tries massaging but not helpful. Arch in foot increases causing cramps. Has not tried taking magnesium or tonic water    Patient is a 61 y.o. female who presents for cramping in legs/feet.  HPI: Has had a few days of cramping in her calves, very painful. Mostly in the evenings and they sometimes wake her up from sleep.  They do occur a little bit during the day though. No recent overuse of her legs.  No recent dehydration. No new medications.   Past Medical History:  Diagnosis Date   Allergy    Anxiety    Asthma    Chronic low back pain    DDD, DJD lumbar multilevel   Elevated transaminase level 06/2021   very mild, onset after starting fenofibrate. Staying on fenofibrate, getting RUQ abd u/s, and repeating labs 1 mo. Viral hep serologies neg.   Fibromyalgia 2017   Herniated disc L4-L5   High triglycerides    mild.  TLC.   History of kidney stones    Hypertension    Hypothyroidism    IBS (irritable bowel syndrome)    Internal hemorrhoids    Neuropathic pain    Rx'd neurontin by her orthopedist   Osteoarthritis of both knees    Viscosupp started 2023 ([facility])   Palpitations 09/2018   toprol xl started by cardiologist->no tachyarrhythmia on telemetry   Sleep apnea    Sprain of posterior cruciate ligament of knee right knee   Vitamin D deficiency 02/2017   high dose vit D started 02/23/17--vit D still low on recheck.  Needs 50K twice per week dosing indefinitely. Vit D level mid 40s Nov 2019.    Past Surgical History:  Procedure Laterality Date   CARDIOVASCULAR STRESS TEST  09/19/2018   Low risk   COLONOSCOPY W/ POLYPECTOMY  09/2012   Dr. Loreta Ave: diverticulosis.  Recall 2024.   SHOULDER ARTHROSCOPY WITH ROTATOR CUFF REPAIR AND SUBACROMIAL DECOMPRESSION Left 12/12/2021   Procedure: Left shoulder arthroscopy with  rotator cuff repair, extensive debridement and subacromial decompression;  Surgeon: Yolonda Kida, MD;  Location: [facility] OR;  Service: Orthopedics;  Laterality: Left;  2 hrs   TOTAL ABDOMINAL HYSTERECTOMY W/ BILATERAL SALPINGOOPHORECTOMY  approx 2006   Nonmalignant reasons   TRANSTHORACIC ECHOCARDIOGRAM  09/19/2018   EF 60-65%, mild LVH, Grd I DD.    Outpatient Medications Prior to Visit  Medication Sig Dispense Refill   Cyanocobalamin (VITAMIN B-12 PO) Take 1 tablet by mouth 3 (three) times a week.     fenofibrate 160 MG tablet TAKE 1 TABLET BY MOUTH EVERY DAY 90 tablet 3   hydrochlorothiazide (HYDRODIURIL) 25 MG tablet Take 0.5 tablets (12.5 mg total) by mouth daily. 45 tablet 3   lidocaine (LIDODERM) 5 % Place 1 patch onto the skin daily. Remove & Discard patch within 12 hours or as directed by MD     metoprolol succinate (TOPROL-XL) 25 MG 24 hr tablet Take 12.5 mg by mouth daily. May take an additional 12.5 - 25 mg for irregular heart rate     pregabalin (LYRICA) 150 MG capsule Take 1 capsule (150 mg total) by mouth 2 (two) times daily. 180 capsule 0   TURMERIC-GINGER PO Take by mouth daily.     Vitamin D, Ergocalciferol, (DRISDOL) 1.25 MG (50000 UNIT) CAPS capsule TAKE 1 CAPSULE BY MOUTH TWICE  WEEKLY 24 capsule 2   Coenzyme Q10 (COQ10 PO) Take 1 capsule by mouth daily. (Patient not taking: Reported on 04/09/2022)     DULoxetine (CYMBALTA) 60 MG capsule TAKE 1 CAPSULE BY MOUTH EVERY DAY (Patient taking differently: Take 60 mg by mouth at bedtime.) 90 capsule 3   levothyroxine (SYNTHROID) 200 MCG tablet Take 1 tablet (200 mcg total) by mouth daily before breakfast. 90 tablet 3   No facility-administered medications prior to visit.    Allergies  Allergen Reactions   Aspirin Shortness Of Breath and Other (See Comments)    nasal congestion   Nsaids Anaphylaxis   Sulfa Antibiotics Rash    ROS As per HPI  PE:    04/09/2022   11:12 AM 02/24/2022    8:39 AM 12/12/2021   10:00 AM   Vitals with BMI  Height 5\' 3"  5\' 3"    Weight 301 lbs 6 oz 302 lbs   BMI 53.4 53.51   Systolic 102 112  Diastolic 68 77 82  Pulse 54 58 59   Physical Exam  Gen: Alert, well appearing.  Patient is oriented to person, place, time, and situation. AFFECT: pleasant, lucid thought and speech. Calves nontender.  They are symmetric.  No rash or warmth.  Range of motion of the ankles fully intact.  LABS:  Last CBC Lab Results  Component Value Date   WBC 6.3 12/05/2021   HGB 13.1 12/05/2021   HCT 41.2 12/05/2021   MCV 88.8 12/05/2021   MCH 28.2 12/05/2021   RDW 13.3 12/05/2021   PLT 311 12/05/2021   Last metabolic panel Lab Results  Component Value Date   GLUCOSE 94 02/24/2022   NA 143 02/24/2022   K 3.8 02/24/2022   CL 106 02/24/2022   CO2 32 02/24/2022   BUN 19 02/24/2022   CREATININE 0.95 02/24/2022   GFRNONAA >60 12/05/2021   CALCIUM 9.5 02/24/2022   PROT 6.0 02/24/2022   ALBUMIN 3.9 02/24/2022   BILITOT 0.5 02/24/2022   ALKPHOS 34 (L) 02/24/2022   AST 18 02/24/2022   ALT 20 02/24/2022   ANIONGAP 7 12/05/2021   Last hemoglobin A1c Lab Results  Component Value Date   HGBA1C 5.3 09/19/2018   Last thyroid functions Lab Results  Component Value Date   TSH 0.68 02/24/2022   T3TOTAL 84 01/01/2018   IMPRESSION AND PLAN:  Muscle cramps. Instructions: Drink 3 oz tonic water in morning and evening. Take 500mg  Magnesium oxide OTC tab each evening. Stretch your calves.  An After Visit Summary was printed and given to the patient.  FOLLOW UP: Return if symptoms worsen or fail to improve.  Signed:  02/26/2022, MD           04/09/2022

## 2022-04-09 NOTE — Patient Instructions (Signed)
Drink 3 oz tonic water in morning and evening.  Take 500mg  Magnesium oxide OTC tab each evening.  Stretch your calves.

## 2022-04-16 ENCOUNTER — Other Ambulatory Visit: Payer: Self-pay | Admitting: Family Medicine

## 2022-04-17 ENCOUNTER — Other Ambulatory Visit: Payer: Self-pay

## 2022-04-17 NOTE — Telephone Encounter (Signed)
RF request for Metoprolol LOV: 04/09/22 Next ov: 05/27/22 Last written: 09/09/21, historical provider   Please fill, if appropriate.

## 2022-05-25 ENCOUNTER — Other Ambulatory Visit: Payer: Self-pay | Admitting: Family Medicine

## 2022-05-27 ENCOUNTER — Encounter: Payer: Self-pay | Admitting: Family Medicine

## 2022-05-27 ENCOUNTER — Ambulatory Visit (INDEPENDENT_AMBULATORY_CARE_PROVIDER_SITE_OTHER): Payer: BC Managed Care – PPO | Admitting: Family Medicine

## 2022-05-27 VITALS — BP 103/54 | HR 63 | Temp 98.4°F | Ht 63.0 in | Wt 308.0 lb

## 2022-05-27 DIAGNOSIS — Z0000 Encounter for general adult medical examination without abnormal findings: Secondary | ICD-10-CM

## 2022-05-27 DIAGNOSIS — Z23 Encounter for immunization: Secondary | ICD-10-CM

## 2022-05-27 DIAGNOSIS — E559 Vitamin D deficiency, unspecified: Secondary | ICD-10-CM

## 2022-05-27 DIAGNOSIS — E538 Deficiency of other specified B group vitamins: Secondary | ICD-10-CM

## 2022-05-27 DIAGNOSIS — I10 Essential (primary) hypertension: Secondary | ICD-10-CM

## 2022-05-27 DIAGNOSIS — E781 Pure hyperglyceridemia: Secondary | ICD-10-CM

## 2022-05-27 DIAGNOSIS — M797 Fibromyalgia: Secondary | ICD-10-CM

## 2022-05-27 LAB — CBC
HCT: 39 % (ref 36.0–46.0)
Hemoglobin: 13.3 g/dL (ref 12.0–15.0)
MCHC: 34.1 g/dL (ref 30.0–36.0)
MCV: 85 fl (ref 78.0–100.0)
Platelets: 319 10*3/uL (ref 150.0–400.0)
RBC: 4.58 Mil/uL (ref 3.87–5.11)
RDW: 13.9 % (ref 11.5–15.5)
WBC: 4.5 10*3/uL (ref 4.0–10.5)

## 2022-05-27 LAB — VITAMIN B12: Vitamin B-12: >1500 pg/mL — ABNORMAL HIGH (ref 211–911)

## 2022-05-27 LAB — BASIC METABOLIC PANEL
BUN: 20 mg/dL (ref 6–23)
CO2: 30 mEq/L (ref 19–32)
Calcium: 9.7 mg/dL (ref 8.4–10.5)
Chloride: 105 mEq/L (ref 96–112)
Creatinine, Ser: 0.84 mg/dL (ref 0.40–1.20)
GFR: 75.56 mL/min (ref >60.00)
Glucose, Bld: 76 mg/dL (ref 70–99)
Potassium: 3.7 mEq/L (ref 3.5–5.1)
Sodium: 142 mEq/L (ref 135–145)

## 2022-05-27 LAB — VITAMIN D 25 HYDROXY (VIT D DEFICIENCY, FRACTURES): VITD: 72.49 ng/mL (ref 30.00–100.00)

## 2022-05-27 MED ORDER — PREGABALIN 150 MG PO CAPS
150.0000 mg | ORAL_CAPSULE | Freq: Two times a day (BID) | ORAL | 1 refills | Status: DC
Start: 2022-05-27 — End: 2022-06-24

## 2022-05-27 NOTE — Patient Instructions (Signed)

## 2022-05-27 NOTE — Progress Notes (Signed)
Office Note 05/27/2022  CC:  Chief Complaint  Patient presents with   Annual Exam    Pt is not fasting   Patient is a 61 y.o. female who is here for annual health maintenance exam and 34-monthfollow-up hypertension and fibromyalgia.. A/P as of last visit: "#1 hypertension, well controlled on HCTZ 12.5 mg a day and Toprol-XL 12.5 mg/day. Electrolytes and creatinine today.  2.  Hypertriglyceridemia.  Tolerating fenofibrate 160 mg a day. Lipid and hepatic panel today.  3.  Fibromyalgia.  Overall pretty stable on Lyrica and Cymbalta.  Continue Cymbalta 60 mg a day and Lyrica 150 mg twice daily.   #4 hypothyroidism.  Takes 200 mcg levothyroxine daily. TSH today."  INTERIM HX: Doing fairly well. Some ups and downs in her emotions but nothing profound or prolonged. Fibromyalgia pain pretty stable, gets acupuncture periodically and this seems to help for short period. Denies trigger points.  Her biggest areas of pain are the back of her neck and the tops of her shoulders. No occipital headaches.  No home blood pressure monitoring.    Past Medical History:  Diagnosis Date   Allergy    Anxiety    Asthma    Chronic low back pain    DDD, DJD lumbar multilevel   Elevated transaminase level 06/2021   very mild, onset after starting fenofibrate. Staying on fenofibrate, getting RUQ abd u/s, and repeating labs 1 mo. Viral hep serologies neg.   Fibromyalgia 2017   Herniated disc L4-L5   High triglycerides    mild.  TLC.   History of kidney stones    Hypertension    Hypothyroidism    IBS (irritable bowel syndrome)    Internal hemorrhoids    Neuropathic pain    Rx'd neurontin by her orthopedist   Osteoarthritis of both knees    Viscosupp started 2023 ([facility])   Palpitations 09/2018   toprol xl started by cardiologist->no tachyarrhythmia on telemetry   Sleep apnea    Sprain of posterior cruciate ligament of knee right knee   Vitamin D deficiency 02/2017   high dose vit  D started 02/23/17--vit D still low on recheck.  Needs 50K twice per week dosing indefinitely. Vit D level mid 40s Nov 2019.    Past Surgical History:  Procedure Laterality Date   CARDIOVASCULAR STRESS TEST  09/19/2018   Low risk   COLONOSCOPY W/ POLYPECTOMY  09/2012   Dr. MCollene Mares diverticulosis.  Recall 2024.   SHOULDER ARTHROSCOPY WITH ROTATOR CUFF REPAIR AND SUBACROMIAL DECOMPRESSION Left 12/12/2021   Procedure: Left shoulder arthroscopy with rotator cuff repair, extensive debridement and subacromial decompression;  Surgeon: RNicholes Stairs MD;  [facility] [facility]  Service: Orthopedics;  Laterality: Left;  2 hrs   TOTAL ABDOMINAL HYSTERECTOMY W/ BILATERAL SALPINGOOPHORECTOMY  approx 2006   Nonmalignant reasons   TRANSTHORACIC ECHOCARDIOGRAM  09/19/2018   EF 60-65%, mild LVH, Grd I DD.    Family History  Problem Relation Age of Onset   Arthritis Mother        osteo arithritis   Obesity Mother    Fibroids Mother        uterine   Lymphoma Father    Sarcoidosis Father    Pulmonary fibrosis Father    Celiac disease Sister    Neuropathy Brother    Arthritis Maternal Grandmother        rheumatoid   Cancer Maternal Grandmother        cervical   Heart disease Maternal Grandfather  a-fib   COPD Maternal Grandfather        emphysema   Vision loss Paternal Grandmother    Emphysema Paternal Grandfather    Lung cancer Paternal Grandfather    Cancer Daughter        ovarian   Asthma Son    Breast cancer Neg Hx     Social History   Socioeconomic History   Marital status: Married    Spouse name: Not on file   Number of children: Not on file   Years of education: Not on file   Highest education level: Not on file  Occupational History   Not on file  Tobacco Use   Smoking status: Never    Passive exposure: Past   Smokeless tobacco: Never  Vaping Use   Vaping Use: Never used  Substance and Sexual Activity   Alcohol use: No    Comment: rarely   Drug use: No    Sexual activity: Yes    Partners: Male    Birth control/protection: None  Other Topics Concern   Not on file  Social History Narrative   Married 26 yrs, 3 children (1 daught, 2 sons).   [place]ig from [place].   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   No tobacco, very rare alcohol, no drugs.         Social Determinants of Health   Financial Resource Strain: Not on file  Food Insecurity: Not on file  Transportation Needs: Not on file  Physical Activity: Not on file  Stress: Not on file  Social Connections: Not on file  Intimate Partner Violence: Not on file    Outpatient Medications Prior to Visit  Medication Sig Dispense Refill   Cyanocobalamin (VITAMIN B-12 PO) Take 1 tablet by mouth 3 (three) times a week.     DULoxetine (CYMBALTA) 60 MG capsule TAKE 1 CAPSULE BY MOUTH EVERY DAY 90 capsule 1   fenofibrate 160 MG tablet TAKE 1 TABLET BY MOUTH EVERY DAY 90 tablet 3   hydrochlorothiazide (HYDRODIURIL) 25 MG tablet Take 0.5 tablets (12.5 mg total) by mouth daily. 45 tablet 3   levothyroxine (SYNTHROID) 200 MCG tablet Take 1 tablet (200 mcg total) by mouth daily before breakfast. 90 tablet 1   metoprolol succinate (TOPROL-XL) 25 MG 24 hr tablet Take 0.5 tablets (12.5 mg total) by mouth daily. 45 tablet 1   TURMERIC-GINGER PO Take by mouth daily.     Vitamin D, Ergocalciferol, (DRISDOL) 1.25 MG (50000 UNIT) CAPS capsule TAKE 1 CAPSULE BY MOUTH TWICE WEEKLY 24 capsule 2   lidocaine (LIDODERM) 5 % Place 1 patch onto the skin daily. Remove & Discard patch within 12 hours or as directed by MD (Patient not taking: Reported on 05/27/2022)     pregabalin (LYRICA) 150 MG capsule Take 1 capsule (150 mg total) by mouth 2 (two) times daily. 180 capsule 0   No facility-administered medications prior to visit.    Allergies  Allergen Reactions   Aspirin Shortness Of Breath and Other (See Comments)    nasal congestion   Nsaids Anaphylaxis   Sulfa  Antibiotics Rash    ROS Review of Systems  Constitutional:  Negative for appetite change, chills, fatigue and fever.  HENT:  Negative for congestion, dental problem, ear pain and sore throat.   Eyes:  Negative for discharge, redness and visual disturbance.  Respiratory:  Negative for cough, chest tightness, shortness of breath and wheezing.   Cardiovascular:  Negative for  chest pain, palpitations and leg swelling.  Gastrointestinal:  Negative for abdominal pain, blood in stool, diarrhea, nausea and vomiting.  Genitourinary:  Negative for difficulty urinating, dysuria, flank pain, frequency, hematuria and urgency.  Musculoskeletal:  Positive for myalgias and neck pain. Negative for arthralgias, back pain, joint swelling and neck stiffness.  Skin:  Negative for pallor and rash.  Neurological:  Negative for dizziness, speech difficulty, weakness and headaches.  Hematological:  Negative for adenopathy. Does not bruise/bleed easily.  Psychiatric/Behavioral:  Negative for confusion and sleep disturbance. The patient is not nervous/anxious.     PE;    05/27/2022    8:14 AM 04/09/2022   11:12 AM 02/24/2022    8:39 AM  Vitals with BMI  Height _0  _1  _2   Weight 308 lbs 301 lbs 6 oz 302 lbs  BMI 54.57 09.3 26.71  Systolic 245 809 983  Diastolic 54 68 77  Pulse 63 54 58     Exam chaperoned by Deveron Furlong, CMA. Gen: Alert, well appearing.  Patient is oriented to person, place, time, and situation. AFFECT: pleasant, lucid thought and speech. ENT: Ears: EACs clear, normal epithelium.  TMs with good light reflex and landmarks bilaterally.  Eyes: no injection, icteris, swelling, or exudate.  EOMI, PERRLA. Nose: no drainage or turbinate edema/swelling.  No injection or focal lesion.  Mouth: lips without lesion/swelling.  Oral mucosa pink and moist.  Dentition intact and without obvious caries or gingival swelling.  Oropharynx without erythema, exudate, or swelling.  Neck:  supple/nontender.  No LAD, mass, or TM.  Carotid pulses 2+ bilaterally, without bruits. CV: RRR, no m/r/g.   LUNGS: CTA bilat, nonlabored resps, good aeration in all lung fields. ABD: soft, NT, ND, BS normal.  No hepatospenomegaly or mass.  No bruits. EXT: no clubbing, cyanosis, or edema.  Musculoskeletal: Mild discomfort to palpation diffusely in trapezius and cervical paraspinous regions.  No occipital tenderness.  No trigger points.  Otherwise, no joint swelling, erythema, warmth, or tenderness.  ROM of all joints intact. Skin - no sores or suspicious lesions or rashes or color changes  Pertinent labs:  Lab Results  Component Value Date   TSH 0.68 02/24/2022   Lab Results  Component Value Date   WBC 6.3 12/05/2021   HGB 13.1 12/05/2021   HCT 41.2 12/05/2021   MCV 88.8 12/05/2021   PLT 311 12/05/2021   Lab Results  Component Value Date   CREATININE 0.95 02/24/2022   BUN 19 02/24/2022   NA 143 02/24/2022   K 3.8 02/24/2022   CL 106 02/24/2022   CO2 32 02/24/2022   Lab Results  Component Value Date   ALT 20 02/24/2022   AST 18 02/24/2022   ALKPHOS 34 (L) 02/24/2022   BILITOT 0.5 02/24/2022   Lab Results  Component Value Date   CHOL 128 02/24/2022   Lab Results  Component Value Date   HDL 54.60 02/24/2022   Lab Results  Component Value Date   LDLCALC 50 02/24/2022   Lab Results  Component Value Date   TRIG 113.0 02/24/2022   Lab Results  Component Value Date   CHOLHDL 2 02/24/2022   Lab Results  Component Value Date   HGBA1C 5.3 09/19/2018   Last vitamin D Lab Results  Component Value Date   VD25OH 50.92 04/11/2021   Lab Results  Component Value Date   VITAMINB12 >1550 (H) 04/11/2021   ASSESSMENT AND PLAN:   #1 hypertension, well controlled on HCTZ 12.5 mg daily  and Toprol-XL 12.5 mg daily. Electrolytes and creatinine today.  2.  Hypertriglyceridemia.  Excellent triglycerides 3 months ago.  Continue fenofibrate 160 mg daily. Lipid panel and  hepatic panel in 6 months.  #3 fibromyalgia syndrome. Pretty stable long-term on Cymbalta 60 mg a day and Lyrica 150 mg twice a day.  Continue acupuncture treatments.  4 history of vitamin B12 deficiency.  Her last level 1 year ago was too high. Repeat vitamin B12 level today.  She takes an oral supplement 3 times a week now stead of daily.  5 vitamin D deficiency.  Currently on 50,000 unit ergocalciferol tab twice a week. Check vitamin D level today.  Health maintenance exam: Reviewed age and gender appropriate health maintenance issues (prudent diet, regular exercise, health risks of tobacco and excessive alcohol, use of seatbelts, fire alarms in home, use of sunscreen).  Also reviewed age and gender appropriate health screening as well as vaccine recommendations. Vaccines: Flu->given today. otherwise all up-to-date. Labs: CBC, be met, vitamin D, vitamin B12.   Cervical ca screening: no further screening indicated b/c hx of TAH for nonmalignant dx. Breast ca screening: Next mammogram due 09/2022. Colon ca screening: recall 2024. Osteoporosis screening: Anticipate DEXA at age 38.  An After Visit Summary was printed and given to the patient.  FOLLOW UP:  Return in about 6 months (around 11/25/2022) for routine chronic illness f/u.  Signed:  Crissie Sickles, MD           05/27/2022

## 2022-06-09 ENCOUNTER — Encounter: Payer: Self-pay | Admitting: Family Medicine

## 2022-06-09 ENCOUNTER — Ambulatory Visit (INDEPENDENT_AMBULATORY_CARE_PROVIDER_SITE_OTHER): Payer: Self-pay | Admitting: Family Medicine

## 2022-06-09 VITALS — BP 116/75 | HR 62 | Temp 98.1°F | Wt 309.8 lb

## 2022-06-09 DIAGNOSIS — M62838 Other muscle spasm: Secondary | ICD-10-CM

## 2022-06-09 DIAGNOSIS — M545 Low back pain, unspecified: Secondary | ICD-10-CM

## 2022-06-09 DIAGNOSIS — M5136 Other intervertebral disc degeneration, lumbar region: Secondary | ICD-10-CM

## 2022-06-09 MED ORDER — CYCLOBENZAPRINE HCL 10 MG PO TABS: 5.0000 mg | ORAL_TABLET | Freq: Two times a day (BID) | ORAL | 0 refills | Status: DC | PRN

## 2022-06-09 MED ORDER — METHYLPREDNISOLONE ACETATE 80 MG/ML IJ SUSP
80.0000 mg | Freq: Once | INTRAMUSCULAR | Status: AC
  Administered 2022-06-09: 80 mg via INTRAMUSCULAR

## 2022-06-09 NOTE — Progress Notes (Signed)
Brenda Schroeder , 05/05/1961, 61 y.o., female MRN: [id number] Patient Care Team    Relationship Specialty Notifications Start End  McGowen, Adrian [place], MD PCP - General Family Medicine  11/24/13   Juanita Craver, MD Consulting Physician Gastroenterology  01/04/18   Teodoro Spray, MD Consulting Physician Cardiology  12/31/18   Pedro Earls, MD Consulting Physician Sports Medicine  09/03/21   Gaynelle Arabian, MD Consulting Physician Orthopedic Surgery  10/25/21   Nicholes Stairs, MD Consulting Physician Orthopedic Surgery  11/29/21     Chief Complaint  Patient presents with   Back Pain    3-4 days ago     Subjective: Pt presents for an OV with complaints of localized right lower back pain.  Patient reports pain does not radiate.  She reports she recently went out of town and stayed in a hotel and noticed her back started hurting during that time.  She believes it was from sleeping on a different mattress.  Continues to bother her and she reports movements such as bending can make pain worse.  She says it feels like it catches or pulls in her right lower lumbar area.  She states the pain is more constant now than it was a few days ago.  She is allergic to NSAIDs.  She has not tried anything for her discomfort.  She has a history of lumbar degenerative disease, but states this feels completely different and discomfort is not in the spine or midline.     05/27/2022    8:19 AM 04/09/2022   11:14 AM 02/24/2022    8:45 AM 04/11/2021    8:36 AM 01/20/2019    3:53 PM  Depression screen PHQ 2/9  Decreased Interest 1 1 0 0 0  Down, Depressed, Hopeless 0 0 0 1 0  PHQ - 2 Score 1 1 0 1 0    Allergies  Allergen Reactions   Aspirin Shortness Of Breath and Other (See Comments)    nasal congestion   Nsaids Anaphylaxis   Sulfa Antibiotics Rash   Social History   Social History Narrative   Married 26 yrs, 3 children (1 daught, 2 sons).   [place]ig from [place].   Occupation: works  for [employer] as of 06/2017.   No tobacco, very rare alcohol, no drugs.         Past Medical History:  Diagnosis Date   Allergy    Anxiety    Asthma    Chronic low back pain    DDD, DJD lumbar multilevel   Elevated transaminase level 06/2021   very mild, onset after starting fenofibrate. Staying on fenofibrate, getting RUQ abd u/s, and repeating labs 1 mo. Viral hep serologies neg.   Fibromyalgia 2017   Herniated disc L4-L5   High triglycerides    mild.  TLC.   History of kidney stones    Hypertension    Hypothyroidism    IBS (irritable bowel syndrome)    Internal hemorrhoids    Neuropathic pain    Rx'd neurontin by her orthopedist   Osteoarthritis of both knees    Viscosupp started 2023 ([facility])   Palpitations 09/2018   toprol xl started by cardiologist->no tachyarrhythmia on telemetry   Sleep apnea    Sprain of posterior cruciate ligament of knee right knee   Vitamin D deficiency 02/2017   high dose vit D started 02/23/17--vit D still low on recheck.  Needs 50K twice per  week dosing indefinitely. Vit D level mid 40s Nov 2019.   Past Surgical History:  Procedure Laterality Date   CARDIOVASCULAR STRESS TEST  09/19/2018   Low risk   COLONOSCOPY W/ POLYPECTOMY  09/2012   Dr. Collene Mares: diverticulosis.  Recall 2024.   SHOULDER ARTHROSCOPY WITH ROTATOR CUFF REPAIR AND SUBACROMIAL DECOMPRESSION Left 12/12/2021   Procedure: Left shoulder arthroscopy with rotator cuff repair, extensive debridement and subacromial decompression;  Surgeon: Nicholes Stairs, MD;  Location: [place];  Service: Orthopedics;  Laterality: Left;  2 hrs   TOTAL ABDOMINAL HYSTERECTOMY W/ BILATERAL SALPINGOOPHORECTOMY  approx 2006   Nonmalignant reasons   TRANSTHORACIC ECHOCARDIOGRAM  09/19/2018   EF 60-65%, mild LVH, Grd I DD.   Family History  Problem Relation Age of Onset   Arthritis Mother        osteo arithritis   Obesity Mother    Fibroids Mother        uterine    Lymphoma Father    Sarcoidosis Father    Pulmonary fibrosis Father    Celiac disease Sister    Neuropathy Brother    Arthritis Maternal Grandmother        rheumatoid   Cancer Maternal Grandmother        cervical   Heart disease Maternal Grandfather        a-fib   COPD Maternal Grandfather        emphysema   Vision loss Paternal Grandmother    Emphysema Paternal Grandfather    Lung cancer Paternal Grandfather    Cancer Daughter        ovarian   Asthma Son    Breast cancer Neg Hx    Allergies as of 06/09/2022       Reactions   Aspirin Shortness Of Breath, Other (See Comments)   nasal congestion   Nsaids Anaphylaxis   Sulfa Antibiotics Rash        Medication List        Accurate as of June 09, 2022  4:24 PM. If you have any questions, ask your nurse or doctor.          cyclobenzaprine 10 MG tablet Commonly known as: FLEXERIL Take 0.5-1 tablets (5-10 mg total) by mouth 2 (two) times daily as needed for muscle spasms. Started by: Howard Pouch, DO   DULoxetine 60 MG capsule Commonly known as: CYMBALTA TAKE 1 CAPSULE BY MOUTH EVERY DAY   fenofibrate 160 MG tablet TAKE 1 TABLET BY MOUTH EVERY DAY   hydrochlorothiazide 25 MG tablet Commonly known as: HYDRODIURIL Take 0.5 tablets (12.5 mg total) by mouth daily.   levothyroxine 200 MCG tablet Commonly known as: SYNTHROID Take 1 tablet (200 mcg total) by mouth daily before breakfast.   metoprolol succinate 25 MG 24 hr tablet Commonly known as: TOPROL-XL Take 0.5 tablets (12.5 mg total) by mouth daily.   pregabalin 150 MG capsule Commonly known as: LYRICA Take 1 capsule (150 mg total) by mouth 2 (two) times daily.   TURMERIC-GINGER PO Take by mouth daily.   VITAMIN B-12 PO Take 1 tablet by mouth 3 (three) times a week.   Vitamin D (Ergocalciferol) 1.25 MG (50000 UNIT) Caps capsule Commonly known as: DRISDOL TAKE 1 CAPSULE BY MOUTH TWICE WEEKLY        All past medical history, surgical history,  allergies, family history, immunizations andmedications were updated in the EMR today and reviewed under the history and medication portions of their EMR.     ROS Negative, with the exception  of above mentioned in HPI   Objective:  BP 116/75   Pulse 62   Temp 98.1 F (36.7 C)   Wt (!) 309 lb 12.8 oz (140.5 kg)   SpO2 99%   BMI 54.88 kg/m  Body mass index is 54.88 kg/m. Physical Exam Vitals and nursing note reviewed.  Constitutional:      General: She is not in acute distress.    Appearance: Normal appearance. She is normal weight. She is not ill-appearing or toxic-appearing.  Eyes:     Extraocular Movements: Extraocular movements intact.     Conjunctiva/sclera: Conjunctivae normal.     Pupils: Pupils are equal, round, and reactive to light.  Musculoskeletal:     Lumbar back: Spasms and tenderness present. No swelling or bony tenderness. Decreased range of motion.       Back:  Neurological:     Mental Status: She is alert and oriented to person, place, and time. Mental status is at baseline.  Psychiatric:        Mood and Affect: Mood normal.        Behavior: Behavior normal.        Thought Content: Thought content normal.        Judgment: Judgment normal.      No results found. No results found. No results found for this or any previous visit (from the past 24 hour(s)).  Assessment/Plan: Brenda Schroeder is a 61 y.o. female present for OV for  Lumbar pain/muscle spasm Area of discomfort is reproduced with movement and tender to palpation. She is allergic to NSAIDs-Depo-Medrol injection provided today. Flexeril prescribed Encouraged heat application - methylPREDNISolone acetate (DEPO-MEDROL) injection 80 mg Follow-up in 2-4 weeks with PCP if pain is not resolving, sooner if worsening  Reviewed expectations re: course of current medical issues. Discussed self-management of symptoms. Outlined signs and symptoms indicating need for more acute intervention. Patient  verbalized understanding and all questions were answered. Patient received an After-Visit Summary.    No orders of the defined types were placed in this encounter.  Meds ordered this encounter  Medications   cyclobenzaprine (FLEXERIL) 10 MG tablet    Sig: Take 0.5-1 tablets (5-10 mg total) by mouth 2 (two) times daily as needed for muscle spasms.    Dispense:  60 tablet    Refill:  0   Referral Orders  No referral(s) requested today     Note is dictated utilizing voice recognition software. Although note has been proof read prior to signing, occasional typographical errors still can be missed. If any questions arise, please do not hesitate to call for verification.   electronically signed by:  Howard Pouch, DO  [place]

## 2022-06-09 NOTE — Patient Instructions (Signed)
Low Back Sprain or Strain Rehab Ask your health care provider which exercises are safe for you. Do exercises exactly as told by your health care provider and adjust them as directed. It is normal to feel mild stretching, pulling, tightness, or discomfort as you do these exercises. Stop right away if you feel sudden pain or your pain gets worse. Do not begin these exercises until told by your health care provider. Stretching and range-of-motion exercises These exercises warm up your muscles and joints and improve the movement and flexibility of your back. These exercises also help to relieve pain, numbness, and tingling. Lumbar rotation  Lie on your back on a firm bed or the floor with your knees bent. Straighten your arms out to your sides so each arm forms a 90-degree angle (right angle) with a side of your body. Slowly move (rotate) both of your knees to one side of your body until you feel a stretch in your lower back (lumbar). Try not to let your shoulders lift off the floor. Hold this position for __________ seconds. Tense your abdominal muscles and slowly move your knees back to the starting position. Repeat this exercise on the other side of your body. Repeat __________ times. Complete this exercise __________ times a day. Single knee to chest  Lie on your back on a firm bed or the floor with both legs straight. Bend one of your knees. Use your hands to move your knee up toward your chest until you feel a gentle stretch in your lower back and buttock. Hold your leg in this position by holding on to the front of your knee. Keep your other leg as straight as possible. Hold this position for __________ seconds. Slowly return to the starting position. Repeat with your other leg. Repeat __________ times. Complete this exercise __________ times a day. Prone extension on elbows  Lie on your abdomen on a firm bed or the floor (prone position). Prop yourself up on your elbows. Use your arms  to help lift your chest up until you feel a gentle stretch in your abdomen and your lower back. This will place some of your body weight on your elbows. If this is uncomfortable, try stacking pillows under your chest. Your hips should stay down, against the surface that you are lying on. Keep your hip and back muscles relaxed. Hold this position for __________ seconds. Slowly relax your upper body and return to the starting position. Repeat __________ times. Complete this exercise __________ times a day. Strengthening exercises These exercises build strength and endurance in your back. Endurance is the ability to use your muscles for a long time, even after they get tired. Pelvic tilt This exercise strengthens the muscles that lie deep in the abdomen. Lie on your back on a firm bed or the floor with your legs extended. Bend your knees so they are pointing toward the ceiling and your feet are flat on the floor. Tighten your lower abdominal muscles to press your lower back against the floor. This motion will tilt your pelvis so your tailbone points up toward the ceiling instead of pointing to your feet or the floor. To help with this exercise, you may place a small towel under your lower back and try to push your back into the towel. Hold this position for __________ seconds. Let your muscles relax completely before you repeat this exercise. Repeat __________ times. Complete this exercise __________ times a day. Alternating arm and leg raises  Get on your hands   and knees on a firm surface. If you are on a hard floor, you may want to use padding, such as an exercise mat, to cushion your knees. Line up your arms and legs. Your hands should be directly below your shoulders, and your knees should be directly below your hips. Lift your left leg behind you. At the same time, raise your right arm and straighten it in front of you. Do not lift your leg higher than your hip. Do not lift your arm higher  than your shoulder. Keep your abdominal and back muscles tight. Keep your hips facing the ground. Do not arch your back. Keep your balance carefully, and do not hold your breath. Hold this position for __________ seconds. Slowly return to the starting position. Repeat with your right leg and your left arm. Repeat __________ times. Complete this exercise __________ times a day. Abdominal set with straight leg raise  Lie on your back on a firm bed or the floor. Bend one of your knees and keep your other leg straight. Tense your abdominal muscles and lift your straight leg up, 4-6 inches (10-15 cm) off the ground. Keep your abdominal muscles tight and hold this position for __________ seconds. Do not hold your breath. Do not arch your back. Keep it flat against the ground. Keep your abdominal muscles tense as you slowly lower your leg back to the starting position. Repeat with your other leg. Repeat __________ times. Complete this exercise __________ times a day. Single leg lower with bent knees Lie on your back on a firm bed or the floor. Tense your abdominal muscles and lift your feet off the floor, one foot at a time, so your knees and hips are bent in 90-degree angles (right angles). Your knees should be over your hips and your lower legs should be parallel to the floor. Keeping your abdominal muscles tense and your knee bent, slowly lower one of your legs so your toe touches the ground. Lift your leg back up to return to the starting position. Do not hold your breath. Do not let your back arch. Keep your back flat against the ground. Repeat with your other leg. Repeat __________ times. Complete this exercise __________ times a day. Posture and body mechanics Good posture and healthy body mechanics can help to relieve stress in your body's tissues and joints. Body mechanics refers to the movements and positions of your body while you do your daily activities. Posture is part of body  mechanics. Good posture means: Your spine is in its natural S-curve position (neutral). Your shoulders are pulled back slightly. Your head is not tipped forward (neutral). Follow these guidelines to improve your posture and body mechanics in your everyday activities. Standing  When standing, keep your spine neutral and your feet about hip-width apart. Keep a slight bend in your knees. Your ears, shoulders, and hips should line up. When you do a task in which you stand in one place for a long time, place one foot up on a stable object that is 2-4 inches (5-10 cm) high, such as a footstool. This helps keep your spine neutral. Sitting  When sitting, keep your spine neutral and keep your feet flat on the floor. Use a footrest, if necessary, and keep your thighs parallel to the floor. Avoid rounding your shoulders, and avoid tilting your head forward. When working at a desk or a computer, keep your desk at a height where your hands are slightly lower than your elbows. Slide your   chair under your desk so you are close enough to maintain good posture. When working at a computer, place your monitor at a height where you are looking straight ahead and you do not have to tilt your head forward or downward to look at the screen. Resting When lying down and resting, avoid positions that are most painful for you. If you have pain with activities such as sitting, bending, stooping, or squatting, lie in a position in which your body does not bend very much. For example, avoid curling up on your side with your arms and knees near your chest (fetal position). If you have pain with activities such as standing for a long time or reaching with your arms, lie with your spine in a neutral position and bend your knees slightly. Try the following positions: Lying on your side with a pillow between your knees. Lying on your back with a pillow under your knees. Lifting  When lifting objects, keep your feet at least  shoulder-width apart and tighten your abdominal muscles. Bend your knees and hips and keep your spine neutral. It is important to lift using the strength of your legs, not your back. Do not lock your knees straight out. Always ask for help to lift heavy or awkward objects. This information is not intended to replace advice given to you by your health care provider. Make sure you discuss any questions you have with your health care provider. Document Revised: 11/12/2020 Document Reviewed: 11/12/2020 Elsevier Patient Education  2023 [employer] Inc[employer]  

## 2022-06-24 ENCOUNTER — Ambulatory Visit: Payer: Self-pay | Admitting: Family Medicine

## 2022-06-24 ENCOUNTER — Encounter: Payer: Self-pay | Admitting: Family Medicine

## 2022-06-24 VITALS — BP 114/69 | HR 57 | Temp 98.4°F | Ht 63.0 in | Wt 306.2 lb

## 2022-06-24 DIAGNOSIS — M545 Low back pain, unspecified: Secondary | ICD-10-CM

## 2022-06-24 DIAGNOSIS — M546 Pain in thoracic spine: Secondary | ICD-10-CM

## 2022-06-24 MED ORDER — PREGABALIN 150 MG PO CAPS: 150.0000 mg | ORAL_CAPSULE | Freq: Two times a day (BID) | ORAL | 1 refills | Status: DC

## 2022-06-24 NOTE — Progress Notes (Signed)
OFFICE VISIT  06/24/2022  CC:  Chief Complaint  Patient presents with   Back Pain    Follow up; pain level is 2-3/10. Dull achy pain    Patient is a 61 y.o. female who presents for follow-up back pain.  HPI:  She saw Dr. Claiborne Billings 06/09/22: Myofascial low/mid back pain on the right, Flexeril prescribed and 80 mg Depo-Medrol was given. No specific incident started the pain but she did sleep on a different mattress while on vacation and seem to start after this. No radiation of the pain.  Hard to tell if her pain is any better because she rates it as a pretty mild ache that is sometimes worse with certain movements.  She does have to use Flexeril at times.  Also uses heat. Tylenol no help. Has not been doing any home rehab.  Past Medical History:  Diagnosis Date   Allergy    Anxiety    Asthma    Chronic low back pain    DDD, DJD lumbar multilevel   Elevated transaminase level 06/2021   very mild, onset after starting fenofibrate. Staying on fenofibrate, getting RUQ abd u/s, and repeating labs 1 mo. Viral hep serologies neg.   Fibromyalgia 2017   Herniated disc L4-L5   High triglycerides    mild.  TLC.   History of kidney stones    Hypertension    Hypothyroidism    IBS (irritable bowel syndrome)    Internal hemorrhoids    Neuropathic pain    Rx'd neurontin by her orthopedist   Osteoarthritis of both knees    Viscosupp started 2023 ([facility])   Palpitations 09/2018   toprol xl started by cardiologist->no tachyarrhythmia on telemetry   Sleep apnea    Sprain of posterior cruciate ligament of knee right knee   Vitamin D deficiency 02/2017   high dose vit D started 02/23/17--vit D still low on recheck.  Needs 50K twice per week dosing indefinitely. Vit D level mid 40s Nov 2019.    Past Surgical History:  Procedure Laterality Date   CARDIOVASCULAR STRESS TEST  09/19/2018   Low risk   COLONOSCOPY W/ POLYPECTOMY  09/2012   Dr. Loreta Ave: diverticulosis.  Recall 2024.   SHOULDER  ARTHROSCOPY WITH ROTATOR CUFF REPAIR AND SUBACROMIAL DECOMPRESSION Left 12/12/2021   Procedure: Left shoulder arthroscopy with rotator cuff repair, extensive debridement and subacromial decompression;  Surgeon: Yolonda Kida, MD;  Location: [facility];  Service: Orthopedics;  Laterality: Left;  2 hrs   TOTAL ABDOMINAL HYSTERECTOMY W/ BILATERAL SALPINGOOPHORECTOMY  approx 2006   Nonmalignant reasons   TRANSTHORACIC ECHOCARDIOGRAM  09/19/2018   EF 60-65%, mild LVH, Grd I DD.    Outpatient Medications Prior to Visit  Medication Sig Dispense Refill   DULoxetine (CYMBALTA) 60 MG capsule TAKE 1 CAPSULE BY MOUTH EVERY DAY 90 capsule 1   fenofibrate 160 MG tablet TAKE 1 TABLET BY MOUTH EVERY DAY 90 tablet 3   hydrochlorothiazide (HYDRODIURIL) 25 MG tablet Take 0.5 tablets (12.5 mg total) by mouth daily. 45 tablet 3   levothyroxine (SYNTHROID) 200 MCG tablet Take 1 tablet (200 mcg total) by mouth daily before breakfast. 90 tablet 1   metoprolol succinate (TOPROL-XL) 25 MG 24 hr tablet Take 0.5 tablets (12.5 mg total) by mouth daily. 45 tablet 1   TURMERIC-GINGER PO Take by mouth daily.     Vitamin D, Ergocalciferol, (DRISDOL) 1.25 MG (50000 UNIT) CAPS capsule TAKE 1 CAPSULE BY MOUTH TWICE WEEKLY 24 capsule 2   pregabalin (LYRICA) 150  MG capsule Take 1 capsule (150 mg total) by mouth 2 (two) times daily. 180 capsule 1   cyclobenzaprine (FLEXERIL) 10 MG tablet Take 0.5-1 tablets (5-10 mg total) by mouth 2 (two) times daily as needed for muscle spasms. (Patient not taking: Reported on 06/24/2022) 60 tablet 0   Cyanocobalamin (VITAMIN B-12 PO) Take 1 tablet by mouth 3 (three) times a week. (Patient not taking: Reported on 06/24/2022)     No facility-administered medications prior to visit.    Allergies  Allergen Reactions   Aspirin Shortness Of Breath and Other (See Comments)    nasal congestion   Nsaids Anaphylaxis   Sulfa Antibiotics Rash    ROS As per HPI  PE:    06/24/2022    8:20 AM  06/09/2022    4:14 PM 05/27/2022    8:14 AM  Vitals with BMI  Height 5\' 3"   5\' 3"   Weight 306 lbs 3 oz 309 lbs 13 oz 308 lbs  BMI 54.25 17.40 81.44  Systolic 818 563 149  Diastolic 69 75 54  Pulse 57 62 63     Physical Exam  Neuro: Alert and well-appearing. She has some tenderness to palpation in the thoracolumbar intersection area about the hands of breath inferior to the right scapula.  No trigger point.  Range of motion of spine is fully intact, with extension making the pain a little worse than flexion.  There was range of motion does not make the pain worse.  LABS:  Last CBC Lab Results  Component Value Date   WBC 4.5 05/27/2022   HGB 13.3 05/27/2022   HCT 39.0 05/27/2022   MCV 85.0 05/27/2022   MCH 28.2 12/05/2021   RDW 13.9 05/27/2022   PLT 319.0 70/26/3785   Last metabolic panel Lab Results  Component Value Date   GLUCOSE 76 05/27/2022   NA 142 05/27/2022   K 3.7 05/27/2022   CL 105 05/27/2022   CO2 30 05/27/2022   BUN 20 05/27/2022   CREATININE 0.84 05/27/2022   GFRNONAA >60 12/05/2021   CALCIUM 9.7 05/27/2022   PROT 6.0 02/24/2022   ALBUMIN 3.9 02/24/2022   BILITOT 0.5 02/24/2022   ALKPHOS 34 (L) 02/24/2022   AST 18 02/24/2022   ALT 20 02/24/2022   ANIONGAP 7 12/05/2021   IMPRESSION AND PLAN:  Myofascial thoracolumbar pain. Continue heat.  Referral to physical therapy. May continue Flexeril every 8 hours as needed.  Fibromyalgia--I refilled her Lyrica 150 mg today, 1 tab twice daily--> indicated on the prescription that brand-name is medically necessary for her.  An After Visit Summary was printed and given to the patient.  FOLLOW UP: Return in about 2 months (around 08/24/2022) for f/u back pain.  Signed:  Crissie Sickles, MD           06/24/2022

## 2022-07-09 ENCOUNTER — Encounter: Payer: Self-pay | Admitting: Family Medicine

## 2022-07-20 ENCOUNTER — Other Ambulatory Visit: Payer: Self-pay | Admitting: Family Medicine

## 2022-08-22 ENCOUNTER — Telehealth: Payer: Self-pay | Admitting: Family Medicine

## 2022-08-22 MED ORDER — PREGABALIN 150 MG PO CAPS: 150.0000 mg | ORAL_CAPSULE | Freq: Two times a day (BID) | ORAL | 1 refills | Status: DC

## 2022-08-22 NOTE — Telephone Encounter (Signed)
Requesting: Lyrica Last Visit: 06/24/22 Next Visit: 09/12/22 Last Refill: 06/24/22(180,1)  Please Advise. Med pending

## 2022-08-22 NOTE — Telephone Encounter (Signed)
Rx done. 

## 2022-08-22 NOTE — Telephone Encounter (Signed)
LVM for pt regarding medication.   

## 2022-08-22 NOTE — Telephone Encounter (Signed)
Please  send a new Rx for Lyrica 150mg . The pharmacist called and needs the DAW code to specifically say [place] one

## 2022-08-25 ENCOUNTER — Ambulatory Visit: Payer: Self-pay | Admitting: Family Medicine

## 2022-09-04 NOTE — Telephone Encounter (Signed)
Spoke with pharmacy and PA required for brand name. PA will be started

## 2022-09-04 NOTE — Telephone Encounter (Signed)
Patient called to inquire about status of Lyrica prescription.  Patient tells her that provider sent in generic Lyrica. Patient only wants brand Lyrica, dispense as written.  Please call patient at  [phone]

## 2022-09-05 NOTE — Telephone Encounter (Signed)
Brenda Schroeder (Key: [id number])

## 2022-09-05 NOTE — Telephone Encounter (Signed)
Pt advised PA was denied. She will contact the pharmacy to see what they can do.

## 2022-09-09 ENCOUNTER — Other Ambulatory Visit: Payer: Self-pay | Admitting: Family Medicine

## 2022-09-12 ENCOUNTER — Ambulatory Visit: Payer: Self-pay | Admitting: Family Medicine

## 2022-09-22 ENCOUNTER — Telehealth: Payer: Self-pay | Admitting: Family Medicine

## 2022-09-22 ENCOUNTER — Other Ambulatory Visit (HOSPITAL_COMMUNITY): Payer: Self-pay

## 2022-09-22 ENCOUNTER — Ambulatory Visit: Payer: Managed Care, Other (non HMO) | Admitting: Family Medicine

## 2022-09-22 ENCOUNTER — Encounter: Payer: Self-pay | Admitting: Family Medicine

## 2022-09-22 VITALS — BP 103/66 | HR 56 | Temp 97.9°F | Ht 63.0 in | Wt 316.4 lb

## 2022-09-22 DIAGNOSIS — M797 Fibromyalgia: Secondary | ICD-10-CM | POA: Diagnosis not present

## 2022-09-22 DIAGNOSIS — M545 Low back pain, unspecified: Secondary | ICD-10-CM

## 2022-09-22 DIAGNOSIS — M546 Pain in thoracic spine: Secondary | ICD-10-CM | POA: Diagnosis not present

## 2022-09-22 MED ORDER — PREGABALIN 150 MG PO CAPS: 150.0000 mg | ORAL_CAPSULE | Freq: Two times a day (BID) | ORAL | 1 refills | Status: DC

## 2022-09-22 NOTE — Progress Notes (Signed)
OFFICE VISIT  09/22/2022  CC:  Chief Complaint  Patient presents with   Back Pain    Follow up; pt reports pain to better but now her knee is causing discomfort.    Patient is a 62 y.o. female who presents for 27-month follow-up back pain. A/P as of last visit: "Myofascial thoracolumbar pain. Continue heat.  Referral to physical therapy. May continue Flexeril every 8 hours as needed.   Fibromyalgia--I refilled her Lyrica 150 mg today, 1 tab twice daily--> indicated on the prescription that brand-name is medically necessary for her."  INTERIM HX: He is happy to say that her back is about 90% improved.  She did a home stretching, heating pad, and Tylenol.  PT never got arranged, seemed to have been some confusion on the scheduling and the things. At any rate, she is pleased with her improvement. Unfortunately, her left knee is beginning to bother her again and she is getting ready to go see her orthopedist about this. She is having trouble with the pharmacy getting her Lyrica, we talked about this today.  Past Medical History:  Diagnosis Date   Allergy    Anxiety    Asthma    Chronic low back pain    DDD, DJD lumbar multilevel   Elevated transaminase level 06/2021   very mild, onset after starting fenofibrate. Staying on fenofibrate, getting RUQ abd u/s, and repeating labs 1 mo. Viral hep serologies neg.   Fibromyalgia 2017   Herniated disc L4-L5   High triglycerides    mild.  TLC.   History of kidney stones    Hypertension    Hypothyroidism    IBS (irritable bowel syndrome)    Internal hemorrhoids    Neuropathic pain    Rx'd neurontin by her orthopedist   Osteoarthritis of both knees    Viscosupp started 2023 ([facility])   Palpitations 09/2018   toprol xl started by cardiologist->no tachyarrhythmia on telemetry   Sleep apnea    Sprain of posterior cruciate ligament of knee right knee   Vitamin D deficiency 02/2017   high dose vit D started 02/23/17--vit D still low  on recheck.  Needs 50K twice per week dosing indefinitely. Vit D level mid 40s Nov 2019.    Past Surgical History:  Procedure Laterality Date   CARDIOVASCULAR STRESS TEST  09/19/2018   Low risk   COLONOSCOPY W/ POLYPECTOMY  09/2012   Dr. Collene Mares: diverticulosis.  Recall 2024.   SHOULDER ARTHROSCOPY WITH ROTATOR CUFF REPAIR AND SUBACROMIAL DECOMPRESSION Left 12/12/2021   Procedure: Left shoulder arthroscopy with rotator cuff repair, extensive debridement and subacromial decompression;  Surgeon: Nicholes Stairs, MD;  Location: [facility];  Service: Orthopedics;  Laterality: Left;  2 hrs   TOTAL ABDOMINAL HYSTERECTOMY W/ BILATERAL SALPINGOOPHORECTOMY  approx 2006   Nonmalignant reasons   TRANSTHORACIC ECHOCARDIOGRAM  09/19/2018   EF 60-65%, mild LVH, Grd I DD.    Outpatient Medications Prior to Visit  Medication Sig Dispense Refill   DULoxetine (CYMBALTA) 60 MG capsule TAKE 1 CAPSULE BY MOUTH EVERY DAY 90 capsule 0   fenofibrate 160 MG tablet TAKE 1 TABLET BY MOUTH EVERY DAY 90 tablet 0   hydrochlorothiazide (HYDRODIURIL) 25 MG tablet TAKE 1/2 TABLET BY MOUTH EVERY DAY 45 tablet 0   levothyroxine (SYNTHROID) 200 MCG tablet Take 1 tablet (200 mcg total) by mouth daily before breakfast. 90 tablet 1   metoprolol succinate (TOPROL-XL) 25 MG 24 hr tablet Take 0.5 tablets (12.5 mg total) by mouth daily. Zenda  tablet 1   TURMERIC-GINGER PO Take by mouth daily.     Vitamin D, Ergocalciferol, (DRISDOL) 1.25 MG (50000 UNIT) CAPS capsule TAKE 1 CAPSULE BY MOUTH TWICE WEEKLY 24 capsule 1   pregabalin (LYRICA) 150 MG capsule Take 1 capsule (150 mg total) by mouth 2 (two) times daily. 180 capsule 1   cyclobenzaprine (FLEXERIL) 10 MG tablet Take 0.5-1 tablets (5-10 mg total) by mouth 2 (two) times daily as needed for muscle spasms. (Patient not taking: Reported on 06/24/2022) 60 tablet 0   No facility-administered medications prior to visit.    Allergies  Allergen Reactions   Aspirin Shortness Of Breath and  Other (See Comments)    nasal congestion   Nsaids Anaphylaxis   Sulfa Antibiotics Rash    Review of Systems As per HPI  PE:    09/22/2022    8:32 AM 06/24/2022    8:20 AM 06/09/2022    4:14 PM  Vitals with BMI  Height 5\' 3"  5\' 3"    Weight 316 lbs 6 oz 306 lbs 3 oz 309 lbs 13 oz  BMI 56.06 53.66 44.03  Systolic 474 259 563  Diastolic 66 69 75  Pulse 56 57 62     Physical Exam  Gen: Alert, well appearing.  Patient is oriented to person, place, time, and situation. No further exam today.  LABS:  Last metabolic panel Lab Results  Component Value Date   GLUCOSE 76 05/27/2022   NA 142 05/27/2022   K 3.7 05/27/2022   CL 105 05/27/2022   CO2 30 05/27/2022   BUN 20 05/27/2022   CREATININE 0.84 05/27/2022   GFRNONAA >60 12/05/2021   CALCIUM 9.7 05/27/2022   PROT 6.0 02/24/2022   ALBUMIN 3.9 02/24/2022   BILITOT 0.5 02/24/2022   ALKPHOS 34 (L) 02/24/2022   AST 18 02/24/2022   ALT 20 02/24/2022   ANIONGAP 7 12/05/2021   IMPRESSION AND PLAN:  #1 musculoskeletal low back pain, significantly improved. She points to the right SI joint and sciatic notch region as location of some intermittent pains that are residual.  Continue Tylenol and stretching.  2.  Fibromyalgia.  Brand-name Lyrica has been the only thing that is about her significant relief. We are working on getting her this, having some pharmacy/payer issues.  Printed a prescription today for Lyrica 150 mg, 1 twice daily, #180, refill x 1.  I stated on the prescription that it should be dispensed as written--brand-name medically necessary.  An After Visit Summary was printed and given to the patient.  FOLLOW UP: Return for Keep appointment already scheduled for 11/25/2022. Next CPE 05/2023 Signed:  Crissie Sickles, MD           09/22/2022

## 2022-09-22 NOTE — Telephone Encounter (Signed)
Please assist, if possible.  

## 2022-09-22 NOTE — Telephone Encounter (Signed)
Pt called back to add that the pharmacy told her they can not fax anything, but it would need to be accessed from Cover My Meds

## 2022-09-22 NOTE — Telephone Encounter (Signed)
Pharmacy Patient Advocate Encounter   Received notification that prior authorization for Lyrica 150MG  capsules is required/requested.   PA submitted on 09/22/22 to (ins) Advertising copywriter via [employer] [place] Status is pending

## 2022-09-22 NOTE — Telephone Encounter (Signed)
I advised patient to have pharmacy fax a prior auth form over to the office. She wanted me to make Dr.McGowen aware that Brenda Schroeder is refusing to allow the name brand of Lyrica to be filled.

## 2022-10-03 NOTE — Telephone Encounter (Signed)
Pharmacy Patient Advocate Encounter  Received notification from [place] that the request for prior authorization for Lyrica 150MG  capsules has been denied due to not meeting the necessary criteria.       Please be advised we currently do not have a Pharmacist to review denials, therefore you will need to process appeals accordingly as needed. Thanks for your support at this time.   You may call [phone] or fax [phone], to appeal.

## 2022-10-03 NOTE — Telephone Encounter (Signed)
Noted  

## 2022-10-20 ENCOUNTER — Ambulatory Visit: Payer: Managed Care, Other (non HMO) | Admitting: Family

## 2022-10-20 ENCOUNTER — Encounter: Payer: Self-pay | Admitting: Family

## 2022-10-20 VITALS — BP 128/80 | HR 63 | Temp 97.5°F | Ht 63.5 in | Wt 313.0 lb

## 2022-10-20 DIAGNOSIS — M62838 Other muscle spasm: Secondary | ICD-10-CM

## 2022-10-20 MED ORDER — CYCLOBENZAPRINE HCL 10 MG PO TABS: 5.0000 mg | ORAL_TABLET | Freq: Three times a day (TID) | ORAL | 0 refills | Status: DC | PRN

## 2022-10-20 NOTE — Progress Notes (Signed)
Assessment & Plan:  Muscle spasm Assessment & Plan: Presentation consistent with muscular spasm.  Refilled Flexeril for her to start.  Encouraged use of Tylenol arthritis.  Encouraged heat, gentle stretching.  She will let me know how she is doing. Would consider prednisone or increasing gabapentin if ineffective.   Orders: -     Cyclobenzaprine HCl; Take 0.5-1 tablets (5-10 mg total) by mouth 3 (three) times daily as needed for muscle spasms.  Dispense: 30 tablet; Refill: 0     Return precautions given.   Risks, benefits, and alternatives of the medications and treatment plan prescribed today were discussed, and patient expressed understanding.   Education regarding symptom management and diagnosis given to patient on AVS either electronically or printed.  No follow-ups on file.  Mable Paris, FNP  Subjective:    Patient ID: Brenda Schroeder, female    DOB: [birth year], 61 y.o.   MRN: [id number]  CC: Brenda Schroeder is a 62 y.o. female who presents today for an acute visit.    HPI: Complains of pain right upper back x 3 days, constant pain.  Worse with movement such as tilting head to the right.  Not a/w eating No new pillow.  No injury No numbness, neck pain,  HA, cp.   She has tried biofreeze, heating pad without relief.  Insurance wouldn't cover brand name lyrica so she is not taking generic lyrica.   She has started prior prescription of Gabapentin 120m , taking 2 tablets at night a couple of weeks ago.    No ckd  Fibromyalgia- she is taking cymbalta 635mqd.   Allergies: Aspirin, Nsaids, and Sulfa antibiotics Current Outpatient Medications on File Prior to Visit  Medication Sig Dispense Refill   DULoxetine (CYMBALTA) 60 MG capsule TAKE 1 CAPSULE BY MOUTH EVERY DAY 90 capsule 0   fenofibrate 160 MG tablet TAKE 1 TABLET BY MOUTH EVERY DAY 90 tablet 0   gabapentin (NEURONTIN) 100 MG capsule Take 200 mg by mouth at bedtime.     hydrochlorothiazide (HYDRODIURIL)  25 MG tablet TAKE 1/2 TABLET BY MOUTH EVERY DAY 45 tablet 0   levothyroxine (SYNTHROID) 200 MCG tablet Take 1 tablet (200 mcg total) by mouth daily before breakfast. 90 tablet 1   metoprolol succinate (TOPROL-XL) 25 MG 24 hr tablet Take 0.5 tablets (12.5 mg total) by mouth daily. 45 tablet 1   TURMERIC-GINGER PO Take by mouth daily.     Vitamin D, Ergocalciferol, (DRISDOL) 1.25 MG (50000 UNIT) CAPS capsule TAKE 1 CAPSULE BY MOUTH TWICE WEEKLY 24 capsule 1   No current facility-administered medications on file prior to visit.    Review of Systems  Constitutional:  Negative for chills and fever.  Respiratory:  Negative for cough.   Cardiovascular:  Negative for chest pain and palpitations.  Gastrointestinal:  Negative for nausea and vomiting.  Musculoskeletal:  Positive for back pain.  Neurological:  Negative for numbness.      Objective:    BP 128/80   Pulse 63   Temp (!) 97.5 F (36.4 C) (Oral)   Ht 5' 3.5" (1.613 m)   Wt (!) 313 lb (142 kg)   SpO2 96%   BMI 54.58 kg/m   BP Readings from Last 3 Encounters:  10/20/22 128/80  09/22/22 103/66  06/24/22 114/69   Wt Readings from Last 3 Encounters:  10/20/22 (!) 313 lb (142 kg)  09/22/22 (!) 316 lb 6.4 oz (143.5 kg)  06/24/22 (!) 306 lb 3.2 oz (138.9 kg)  Physical Exam Vitals reviewed.  Constitutional:      Appearance: She is well-developed.  Eyes:     Conjunctiva/sclera: Conjunctivae normal.  Neck:      Comments: No rash, edema, gross deformity.   Cardiovascular:     Rate and Rhythm: Normal rate and regular rhythm.     Pulses: Normal pulses.     Heart sounds: Normal heart sounds.  Pulmonary:     Effort: Pulmonary effort is normal.     Breath sounds: Normal breath sounds. No wheezing, rhonchi or rales.  Musculoskeletal:     Cervical back: No torticollis. Pain with movement (when brings right ear towards right shoulder) present. No spinous process tenderness or muscular tenderness.  Skin:    General: Skin is  warm and dry.  Neurological:     Mental Status: She is alert.  Psychiatric:        Speech: Speech normal.        Behavior: Behavior normal.        Thought Content: Thought content normal.

## 2022-10-20 NOTE — Patient Instructions (Signed)
Start Flexeril as needed.  Continue heat, gentle stretching. You may use Tylenol arthritis and consider scheduling per below if needed.   As discussed, let's start by scheduling Tylenol Arthritis which is a 633m tablet .   You may take 1-2 tablets every 8 hours ( scheduled) with maximum of 6 tablets per day. Most adults can safely take 4 pills total per day of Tylenol Arthritis 6518mtablet. Do not exceed 6 tablets a day of Tylenol Arthritis 65052mablet   For example , you could take two tablets in the morning ( 8am) and then two tablets again at 4pm.   Maximum daily dose of acetaminophen 4 g per day from all sources.  If you are taking another medication which includes acetaminophen (Tylenol) which may be in cough and cold preparations or pain medication such as Percocet, you will need to factor that into your total daily dose to be safe.  Please let me know if any questions  A great article regarding how to safely take and dose tylenol found below.  Title : 'Acetaminophen safety: Be cautious but not afraid'  https://[email or web address]://www.[facility].org/

## 2022-10-20 NOTE — Assessment & Plan Note (Signed)
Presentation consistent with muscular spasm.  Refilled Flexeril for her to start.  Encouraged use of Tylenol arthritis.  Encouraged heat, gentle stretching.  She will let me know how she is doing. Would consider prednisone or increasing gabapentin if ineffective.

## 2022-10-25 ENCOUNTER — Other Ambulatory Visit: Payer: Self-pay | Admitting: Family Medicine

## 2022-11-25 ENCOUNTER — Ambulatory Visit: Payer: Self-pay | Admitting: Family Medicine

## 2022-12-01 ENCOUNTER — Encounter: Payer: Self-pay | Admitting: Family Medicine

## 2022-12-01 ENCOUNTER — Ambulatory Visit: Payer: Managed Care, Other (non HMO) | Admitting: Family Medicine

## 2022-12-01 VITALS — BP 122/79 | HR 56 | Temp 98.0°F | Ht 63.5 in | Wt 314.4 lb

## 2022-12-01 DIAGNOSIS — E782 Mixed hyperlipidemia: Secondary | ICD-10-CM

## 2022-12-01 DIAGNOSIS — Z1231 Encounter for screening mammogram for malignant neoplasm of breast: Secondary | ICD-10-CM | POA: Diagnosis not present

## 2022-12-01 DIAGNOSIS — I10 Essential (primary) hypertension: Secondary | ICD-10-CM

## 2022-12-01 DIAGNOSIS — E559 Vitamin D deficiency, unspecified: Secondary | ICD-10-CM

## 2022-12-01 DIAGNOSIS — E538 Deficiency of other specified B group vitamins: Secondary | ICD-10-CM | POA: Diagnosis not present

## 2022-12-01 DIAGNOSIS — M546 Pain in thoracic spine: Secondary | ICD-10-CM

## 2022-12-01 DIAGNOSIS — M797 Fibromyalgia: Secondary | ICD-10-CM

## 2022-12-01 MED ORDER — METOPROLOL SUCCINATE ER 25 MG PO TB24: 12.5000 mg | ORAL_TABLET | Freq: Every day | ORAL | 1 refills | Status: DC

## 2022-12-01 MED ORDER — LEVOTHYROXINE SODIUM 200 MCG PO TABS: 200.0000 ug | ORAL_TABLET | Freq: Every day | ORAL | 1 refills | Status: DC

## 2022-12-01 MED ORDER — DULOXETINE HCL 60 MG PO CPEP: 60.0000 mg | ORAL_CAPSULE | Freq: Every day | ORAL | 1 refills | Status: DC

## 2022-12-01 MED ORDER — FENOFIBRATE 160 MG PO TABS: 160.0000 mg | ORAL_TABLET | Freq: Every day | ORAL | 1 refills | Status: DC

## 2022-12-01 MED ORDER — HYDROCHLOROTHIAZIDE 25 MG PO TABS: 12.5000 mg | ORAL_TABLET | Freq: Every day | ORAL | 1 refills | Status: DC

## 2022-12-01 MED ORDER — VITAMIN D (ERGOCALCIFEROL) 1.25 MG (50000 UNIT) PO CAPS: 50000.0000 [IU] | ORAL_CAPSULE | ORAL | 1 refills | Status: DC

## 2022-12-01 MED ORDER — GABAPENTIN 100 MG PO CAPS: ORAL_CAPSULE | ORAL | 1 refills | Status: DC

## 2022-12-01 MED ORDER — TRIAMCINOLONE ACETONIDE 40 MG/ML IJ SUSP
20.0000 mg | Freq: Once | INTRAMUSCULAR | Status: AC
  Administered 2022-12-01: 20 mg via INTRA_ARTICULAR

## 2022-12-01 NOTE — Progress Notes (Signed)
OFFICE VISIT  12/01/2022  CC:  Chief Complaint  Patient presents with   Medical Management of Chronic Issues    Patient is a 62 y.o. female who presents for 22-month follow-up hypertension, fibromyalgia, and discuss thoracolumbar pain.  INTERIM HX: Still in chronic widespread musculo pain c/w fibro. Also ongoing arthritis pain, recently got another L knee injection.  Worst pain lately is recurrence of R mid/low back pain, focal and severe at times, no paresthesias, no radiation, no rash.   PMP AWARE reviewed today: most recent rx for Lyrica was filled 01/23/2022, # 180, rx by me. No red flags.  ROS as above, plus--> no fevers, no CP, no SOB, no wheezing, no cough, no dizziness, no HAs, no rashes, no melena/hematochezia.  No polyuria or polydipsia.   No focal weakness, paresthesias, or tremors.  No acute vision or hearing abnormalities.  No dysuria or unusual/new urinary urgency or frequency.  No recent changes in lower legs. No n/v/d or abd pain.  No palpitations.    Past Medical History:  Diagnosis Date   Allergy    Anxiety    Asthma    Chronic low back pain    DDD, DJD lumbar multilevel   Elevated transaminase level 06/2021   very mild, onset after starting fenofibrate. Staying on fenofibrate, getting RUQ abd u/s, and repeating labs 1 mo. Viral hep serologies neg.   Fibromyalgia 2017   Herniated disc L4-L5   High triglycerides    mild.  TLC.   History of kidney stones    Hypertension    Hypothyroidism    IBS (irritable bowel syndrome)    Internal hemorrhoids    Neuropathic pain    Rx'd neurontin by her orthopedist   Osteoarthritis of both knees    Viscosupp started 2023 ([facility])   Palpitations 09/2018   toprol xl started by cardiologist->no tachyarrhythmia on telemetry   Sleep apnea    Sprain of posterior cruciate ligament of knee right knee   Vitamin D deficiency 02/2017   high dose vit D started 02/23/17--vit D still low on recheck.  Needs 50K twice per week  dosing indefinitely. Vit D level mid 40s Nov 2019.    Past Surgical History:  Procedure Laterality Date   CARDIOVASCULAR STRESS TEST  09/19/2018   Low risk   COLONOSCOPY W/ POLYPECTOMY  09/2012   Dr. Collene Mares: diverticulosis.  Recall 2024.   SHOULDER ARTHROSCOPY WITH ROTATOR CUFF REPAIR AND SUBACROMIAL DECOMPRESSION Left 12/12/2021   Procedure: Left shoulder arthroscopy with rotator cuff repair, extensive debridement and subacromial decompression;  Surgeon: Nicholes Stairs, MD;  Location: [facility];  Service: Orthopedics;  Laterality: Left;  2 hrs   TOTAL ABDOMINAL HYSTERECTOMY W/ BILATERAL SALPINGOOPHORECTOMY  approx 2006   Nonmalignant reasons   TRANSTHORACIC ECHOCARDIOGRAM  09/19/2018   EF 60-65%, mild LVH, Grd I DD.    Outpatient Medications Prior to Visit  Medication Sig Dispense Refill   TURMERIC-GINGER PO Take by mouth daily.     cyclobenzaprine (FLEXERIL) 10 MG tablet Take 0.5-1 tablets (5-10 mg total) by mouth 3 (three) times daily as needed for muscle spasms. (Patient not taking: Reported on 12/01/2022) 30 tablet 0   DULoxetine (CYMBALTA) 60 MG capsule TAKE 1 CAPSULE BY MOUTH EVERY DAY 30 capsule 0   fenofibrate 160 MG tablet TAKE 1 TABLET BY MOUTH EVERY DAY 30 tablet 0   gabapentin (NEURONTIN) 100 MG capsule Take 200 mg by mouth at bedtime.     hydrochlorothiazide (HYDRODIURIL) 25 MG tablet TAKE 1/2 TABLET  BY MOUTH DAILY 45 tablet 0   levothyroxine (SYNTHROID) 200 MCG tablet Take 1 tablet (200 mcg total) by mouth daily before breakfast. 90 tablet 1   metoprolol succinate (TOPROL-XL) 25 MG 24 hr tablet Take 0.5 tablets (12.5 mg total) by mouth daily. 45 tablet 1   Vitamin D, Ergocalciferol, (DRISDOL) 1.25 MG (50000 UNIT) CAPS capsule TAKE 1 CAPSULE BY MOUTH TWICE WEEKLY 12 capsule 0   No facility-administered medications prior to visit.    Allergies  Allergen Reactions   Aspirin Shortness Of Breath and Other (See Comments)    nasal congestion   Nsaids Anaphylaxis   Sulfa  Antibiotics Rash    Review of Systems As per HPI  PE:    12/01/2022    1:04 PM 10/20/2022   10:59 AM 09/22/2022    8:32 AM  Vitals with BMI  Height 5' 3.5" 5' 3.5" 5\' 3"   Weight 314 lbs 6 oz 313 lbs 316 lbs 6 oz  BMI 54.81 123456 [id number]  Systolic 123XX123 [id number]  Diastolic 79 80 66  Pulse 56 63 56   Physical Exam  EXAM:  Gen: Alert, well appearing.  Patient is oriented to person, place, time, and situation. AFFECT: pleasant, lucid thought and speech. R thoracolumbar soft tissues with a fist size area of mild tenderness, with 2 focal trigger points palpable.    LABS:  Last CBC Lab Results  Component Value Date   WBC 4.5 05/27/2022   HGB 13.3 05/27/2022   HCT 39.0 05/27/2022   MCV 85.0 05/27/2022   MCH 28.2 12/05/2021   RDW 13.9 05/27/2022   PLT 319.0 [id number]   Last metabolic panel Lab Results  Component Value Date   GLUCOSE 76 05/27/2022   NA 142 05/27/2022   K 3.7 05/27/2022   CL 105 05/27/2022   CO2 30 05/27/2022   BUN 20 05/27/2022   CREATININE 0.84 05/27/2022   GFRNONAA >60 12/05/2021   CALCIUM 9.7 05/27/2022   PROT 6.0 02/24/2022   ALBUMIN 3.9 02/24/2022   BILITOT 0.5 02/24/2022   ALKPHOS 34 (L) 02/24/2022   AST 18 02/24/2022   ALT 20 02/24/2022   ANIONGAP 7 12/05/2021   Last lipids Lab Results  Component Value Date   CHOL 128 02/24/2022   HDL 54.60 02/24/2022   LDLCALC 50 02/24/2022   LDLDIRECT 49.0 07/26/2021   TRIG 113.0 02/24/2022   CHOLHDL 2 02/24/2022   Last hemoglobin A1c Lab Results  Component Value Date   HGBA1C 5.3 09/19/2018   Last thyroid functions Lab Results  Component Value Date   TSH 0.68 02/24/2022   T3TOTAL 84 01/01/2018   Last vitamin D Lab Results  Component Value Date   VD25OH 72.49 05/27/2022   Last vitamin B12 and Folate Lab Results  Component Value Date   VITAMINB12 >1500 (H) 05/27/2022    IMPRESSION AND PLAN:  1) Fibromyalgia, not ideal control. Lyrica 150 bid brought relief but insurance denies  coverage. She's considering paying out of pocket cost but in the meantime we'll try gradually increasing gabapentin.  Increase to goal of 3 of the 100 mg tabs tid (currently taking 200 qhs). Ok to continue muscle relaxer prn (robaxin). Lidocaine patches otc recommended. I think she has a myofascial trigger point x 2 on R thoracolumbar region. Minimal response to tramadol in the past and this made her too drowsy.  We will avoid any further opioid medication for this reason. Discussed trigger point injection today and she was agreeable to this plan. Consent obtained.  Area prepped sterilely. A trigger point injection was performed at the site of maximal tenderness x2  using 2% plain Lidocaine and Kenalog. This was well tolerated, no complication.  #2 hypertension, well-controlled on Toprol XL 12.5 mg daily and HCTZ 12.5 mg daily.  No labs needed today.  An After Visit Summary was printed and given to the patient.  FOLLOW UP: Return in about 4 weeks (around 12/29/2022) for f/u pain. Next cpe 05/2023 Signed:  Crissie Sickles, MD           12/01/2022

## 2022-12-12 LAB — COLOGUARD: Cologuard: NEGATIVE

## 2022-12-29 ENCOUNTER — Ambulatory Visit: Payer: Managed Care, Other (non HMO) | Admitting: Family Medicine

## 2022-12-30 ENCOUNTER — Encounter: Payer: Self-pay | Admitting: Family Medicine

## 2022-12-31 ENCOUNTER — Ambulatory Visit: Payer: Self-pay | Admitting: Family Medicine

## 2022-12-31 ENCOUNTER — Encounter: Payer: Self-pay | Admitting: Family Medicine

## 2022-12-31 ENCOUNTER — Ambulatory Visit: Payer: Managed Care, Other (non HMO) | Admitting: Family Medicine

## 2022-12-31 VITALS — BP 130/80 | HR 49 | Temp 98.6°F | Ht 63.5 in | Wt 313.4 lb

## 2022-12-31 DIAGNOSIS — R3915 Urgency of urination: Secondary | ICD-10-CM

## 2022-12-31 DIAGNOSIS — R110 Nausea: Secondary | ICD-10-CM | POA: Diagnosis not present

## 2022-12-31 DIAGNOSIS — R1011 Right upper quadrant pain: Secondary | ICD-10-CM | POA: Diagnosis not present

## 2022-12-31 LAB — POCT URINALYSIS DIPSTICK
Bilirubin, UA: NEGATIVE
Blood, UA: NEGATIVE
Glucose, UA: NEGATIVE
Ketones, UA: NEGATIVE
Leukocytes, UA: NEGATIVE
Nitrite, UA: NEGATIVE
Protein, UA: NEGATIVE
Spec Grav, UA: 1.015 (ref 1.010–1.025)
Urobilinogen, UA: 1 E.U./dL
pH, UA: 7 (ref 5.0–8.0)

## 2022-12-31 LAB — CBC WITH DIFFERENTIAL/PLATELET
Basophils Absolute: 0 10*3/uL (ref 0.0–0.1)
Basophils Relative: 0.6 % (ref 0.0–3.0)
Eosinophils Absolute: 0.2 10*3/uL (ref 0.0–0.7)
Eosinophils Relative: 2.7 % (ref 0.0–5.0)
HCT: 40.8 % (ref 36.0–46.0)
Hemoglobin: 13.8 g/dL (ref 12.0–15.0)
Lymphocytes Relative: 41.7 % (ref 12.0–46.0)
Lymphs Abs: 2.4 10*3/uL (ref 0.7–4.0)
MCHC: 33.8 g/dL (ref 30.0–36.0)
MCV: 84.6 fl (ref 78.0–100.0)
Monocytes Absolute: 0.5 10*3/uL (ref 0.1–1.0)
Monocytes Relative: 9.3 % (ref 3.0–12.0)
Neutro Abs: 2.7 10*3/uL (ref 1.4–7.7)
Neutrophils Relative %: 45.7 % (ref 43.0–77.0)
Platelets: 364 10*3/uL (ref 150.0–400.0)
RBC: 4.82 Mil/uL (ref 3.87–5.11)
RDW: 14.7 % (ref 11.5–15.5)
WBC: 5.8 10*3/uL (ref 4.0–10.5)

## 2022-12-31 LAB — COMPREHENSIVE METABOLIC PANEL
ALT: 19 U/L (ref 0–35)
AST: 16 U/L (ref 0–37)
Albumin: 3.8 g/dL (ref 3.5–5.2)
Alkaline Phosphatase: 33 U/L — ABNORMAL LOW (ref 39–117)
BUN: 20 mg/dL (ref 6–23)
CO2: 24 mEq/L (ref 19–32)
Calcium: 9.4 mg/dL (ref 8.4–10.5)
Chloride: 108 mEq/L (ref 96–112)
Creatinine, Ser: 0.85 mg/dL (ref 0.40–1.20)
GFR: 74.18 mL/min (ref >60.00)
Glucose, Bld: 92 mg/dL (ref 70–99)
Potassium: 4 mEq/L (ref 3.5–5.1)
Sodium: 142 mEq/L (ref 135–145)
Total Bilirubin: 0.5 mg/dL (ref 0.2–1.2)
Total Protein: 6 g/dL (ref 6.0–8.3)

## 2022-12-31 LAB — LIPASE: Lipase: 30 U/L (ref 11.0–59.0)

## 2022-12-31 MED ORDER — PROMETHAZINE HCL 12.5 MG PO TABS: ORAL_TABLET | ORAL | 1 refills | Status: DC

## 2022-12-31 MED ORDER — PANTOPRAZOLE SODIUM 40 MG PO TBEC: DELAYED_RELEASE_TABLET | ORAL | 0 refills | Status: DC

## 2022-12-31 NOTE — Progress Notes (Signed)
OFFICE VISIT  12/31/2022  CC:  Chief Complaint  Patient presents with   Nausea    X2 days, has been doing bland diet.     Patient is a 62 y.o. female who presents for nausea.  HPI: 2-day history of nausea, constant, no vomiting but some dry heaving. Able to eat only bland food, small amounts.  Not hydrating as well as usual. Food and drink does not change the nausea any.  No diarrhea.  No fever. Feels an upper abdominal tightness/pressure diffusely but no distinct achy or sharp pains. No lower abdominal pain.  She has a little bit of urinary urgency lately but no dysuria or suprapubic pain. No chest pain, arm pain, jaw pain, shortness of breath, dizziness, or lower extremity swelling. She does not drink alcohol or take NSAIDs.  ROS: see HPI  Past Medical History:  Diagnosis Date   Allergy    Anxiety    Asthma    Chronic low back pain    DDD, DJD lumbar multilevel   Elevated transaminase level 06/2021   very mild, onset after starting fenofibrate. Staying on fenofibrate, getting RUQ abd u/s, and repeating labs 1 mo. Viral hep serologies neg.   Fibromyalgia 2017   Herniated disc L4-L5   High triglycerides    mild.  TLC.   History of kidney stones    Hypertension    Hypothyroidism    IBS (irritable bowel syndrome)    Internal hemorrhoids    Neuropathic pain    Rx'd neurontin by her orthopedist   Osteoarthritis of both knees    Viscosupp started 2023 ([facility])   Palpitations 09/2018   toprol xl started by cardiologist->no tachyarrhythmia on telemetry   Sleep apnea    Sprain of posterior cruciate ligament of knee right knee   Vitamin D deficiency 02/2017   high dose vit D started 02/23/17--vit D still low on recheck.  Needs 50K twice per week dosing indefinitely. Vit D level mid 40s Nov 2019.    Past Surgical History:  Procedure Laterality Date   CARDIOVASCULAR STRESS TEST  09/19/2018   Low risk   COLONOSCOPY W/ POLYPECTOMY  09/2012   Dr. Loreta Ave: diverticulosis.   Recall 2024.   SHOULDER ARTHROSCOPY WITH ROTATOR CUFF REPAIR AND SUBACROMIAL DECOMPRESSION Left 12/12/2021   Procedure: Left shoulder arthroscopy with rotator cuff repair, extensive debridement and subacromial decompression;  Surgeon: Yolonda Kida, MD;  Location: [facility] OR;  Service: Orthopedics;  Laterality: Left;  2 hrs   TOTAL ABDOMINAL HYSTERECTOMY W/ BILATERAL SALPINGOOPHORECTOMY  approx 2006   Nonmalignant reasons   TRANSTHORACIC ECHOCARDIOGRAM  09/19/2018   EF 60-65%, mild LVH, Grd I DD.    Outpatient Medications Prior to Visit  Medication Sig Dispense Refill   DULoxetine (CYMBALTA) 60 MG capsule Take 1 capsule (60 mg total) by mouth daily. 90 capsule 1   fenofibrate 160 MG tablet Take 1 tablet (160 mg total) by mouth daily. 90 tablet 1   gabapentin (NEURONTIN) 100 MG capsule 1-3 tabs po tid 270 capsule 1   hydrochlorothiazide (HYDRODIURIL) 25 MG tablet Take 0.5 tablets (12.5 mg total) by mouth daily. 45 tablet 1   levothyroxine (SYNTHROID) 200 MCG tablet Take 1 tablet (200 mcg total) by mouth daily before breakfast. 90 tablet 1   metoprolol succinate (TOPROL-XL) 25 MG 24 hr tablet Take 0.5 tablets (12.5 mg total) by mouth daily. 45 tablet 1   TURMERIC-GINGER PO Take by mouth daily.     Vitamin D, Ergocalciferol, (DRISDOL) 1.25 MG (50000 UNIT)  CAPS capsule Take 1 capsule (50,000 Units total) by mouth 2 (two) times a week. 24 capsule 1   Facility-Administered Medications Prior to Visit  Medication Dose Route Frequency Provider Last Rate Last Admin   triamcinolone acetonide (KENALOG-40) injection 20 mg  20 mg Intra-articular Once Jasmon Graffam, Maryjean Morn, MD        Allergies  Allergen Reactions   Aspirin Shortness Of Breath and Other (See Comments)    nasal congestion   Nsaids Anaphylaxis   Sulfa Antibiotics Rash    Review of Systems  As per HPI  PE:    12/31/2022    8:04 AM 12/01/2022    1:04 PM 10/20/2022   10:59 AM  Vitals with BMI  Height 5' 3.5" 5' 3.5" 5' 3.5"  Weight  313 lbs 6 oz 314 lbs 6 oz 313 lbs  BMI 54.64 54.81 54.57  Systolic 130 122 096  Diastolic 80 79 80  Pulse 49 56 63     Physical Exam Exam chaperoned by Sammuel Cooper, CMA  Gen: Alert, well appearing.  Patient is oriented to person, place, time, and situation. AFFECT: pleasant, lucid thought and speech. CV: Regular rhythm, bradycardia, no murmur. Lungs are clear bilaterally, breathing is nonlabored. Abdomen is soft and nontender, bowel sounds are normal.  No distention.  Rotund but no distention. No palpable hepatosplenomegaly, no mass, no bruit. Extremities: No edema  LABS:  Last CBC Lab Results  Component Value Date   WBC 4.5 05/27/2022   HGB 13.3 05/27/2022   HCT 39.0 05/27/2022   MCV 85.0 05/27/2022   MCH 28.2 12/05/2021   RDW 13.9 05/27/2022   PLT 319.0 05/27/2022   Last metabolic panel Lab Results  Component Value Date   GLUCOSE 76 05/27/2022   NA 142 05/27/2022   K 3.7 05/27/2022   CL 105 05/27/2022   CO2 30 05/27/2022   BUN 20 05/27/2022   CREATININE 0.84 05/27/2022   GFRNONAA >60 12/05/2021   CALCIUM 9.7 05/27/2022   PROT 6.0 02/24/2022   ALBUMIN 3.9 02/24/2022   BILITOT 0.5 02/24/2022   ALKPHOS 34 (L) 02/24/2022   AST 18 02/24/2022   ALT 20 02/24/2022   ANIONGAP 7 12/05/2021   Last hemoglobin A1c Lab Results  Component Value Date   HGBA1C 5.3 09/19/2018   Last thyroid functions Lab Results  Component Value Date   TSH 0.68 02/24/2022   T3TOTAL 84 01/01/2018   IMPRESSION AND PLAN:  #1 nausea without vomiting. Gastritis suspected but will further eval for symptomatic cholelithiasis/cholecystitis. Cbc,cmet, lipase today.  Limited/right upper quadrant abdominal ultrasound ordered. Phenergan 12.5 mg, 1-2 every 6 hours as needed. Pantoprazole 40 mg twice daily.  2.  Urinary urgency. UA today: Normal.  An After Visit Summary was printed and given to the patient.  FOLLOW UP: Return in about 5 days (around 01/05/2023) for f/u nausea.  Signed:   Santiago Bumpers, MD           12/31/2022

## 2023-01-02 ENCOUNTER — Ambulatory Visit: Payer: Managed Care, Other (non HMO) | Admitting: Family Medicine

## 2023-01-05 ENCOUNTER — Telehealth: Payer: Self-pay

## 2023-01-05 ENCOUNTER — Ambulatory Visit
Admission: RE | Admit: 2023-01-05 | Discharge: 2023-01-05 | Disposition: A | Discharge status: Home / Self Care | Payer: Managed Care, Other (non HMO) | Source: Ambulatory Visit | Attending: Family Medicine | Admitting: Family Medicine

## 2023-01-05 ENCOUNTER — Ambulatory Visit: Payer: Managed Care, Other (non HMO) | Admitting: Family Medicine

## 2023-01-05 ENCOUNTER — Encounter: Payer: Self-pay | Admitting: Family Medicine

## 2023-01-05 VITALS — BP 99/67 | HR 65 | Temp 97.8°F | Ht 63.5 in | Wt 318.6 lb

## 2023-01-05 DIAGNOSIS — R1011 Right upper quadrant pain: Secondary | ICD-10-CM

## 2023-01-05 DIAGNOSIS — K2900 Acute gastritis without bleeding: Secondary | ICD-10-CM

## 2023-01-05 DIAGNOSIS — R110 Nausea: Secondary | ICD-10-CM

## 2023-01-05 MED ORDER — ONDANSETRON HCL 4 MG PO TABS: ORAL_TABLET | ORAL | 1 refills | Status: DC

## 2023-01-05 MED ORDER — ONDANSETRON HCL 4 MG PO TABS: 4.0000 mg | ORAL_TABLET | Freq: Three times a day (TID) | ORAL | 0 refills | Status: DC | PRN

## 2023-01-05 MED ORDER — PANTOPRAZOLE SODIUM 40 MG PO TBEC: DELAYED_RELEASE_TABLET | ORAL | 0 refills | Status: DC

## 2023-01-05 NOTE — Progress Notes (Signed)
OFFICE VISIT  01/05/2023  CC:  Chief Complaint  Patient presents with   Nausea    5 d follow up; pt states she is feeling better but not 100%. If she misses a dose of Protonix or does not keep something on her stomach, nausea still occurs. Current nausea level is 4 or 5 out of 10.    Patient is a 62 y.o. female who presents accompanied by her husband for 5-day follow-up nausea. A/P as of last visit: "1 nausea without vomiting. Gastritis suspected but will further eval for symptomatic cholelithiasis/cholecystitis. Cbc,cmet, lipase today.  Limited/right upper quadrant abdominal ultrasound ordered. Phenergan 12.5 mg, 1-2 every 6 hours as needed. Pantoprazole 40 mg twice daily.   2.  Urinary urgency. UA today: Normal.  INTERIM HX: She feels a mild to moderate amount of improvement.  She has noted when she misses a dose of pantoprazole she has significant return of her nausea.  No vomiting. No abdominal pain or fever. Eating actually does help her feel a bit better. Phenergan caused oversedation.  Past Medical History:  Diagnosis Date   Allergy    Anxiety    Asthma    Chronic low back pain    DDD, DJD lumbar multilevel   Elevated transaminase level 06/2021   very mild, onset after starting fenofibrate. Staying on fenofibrate, getting RUQ abd u/s, and repeating labs 1 mo. Viral hep serologies neg.   Fatty liver    u/s 12/2022   Fibromyalgia 2017   Herniated disc L4-L5   High triglycerides    mild.  TLC.   History of kidney stones    Hypertension    Hypothyroidism    IBS (irritable bowel syndrome)    Internal hemorrhoids    Neuropathic pain    Rx'd neurontin by her orthopedist   Osteoarthritis of both knees    Viscosupp started 2023 ([facility])   Palpitations 09/2018   toprol xl started by cardiologist->no tachyarrhythmia on telemetry   Sleep apnea    Sprain of posterior cruciate ligament of knee right knee   Vitamin D deficiency 02/2017   high dose vit D started  02/23/17--vit D still low on recheck.  Needs 50K twice per week dosing indefinitely. Vit D level mid 40s Nov 2019.    Past Surgical History:  Procedure Laterality Date   CARDIOVASCULAR STRESS TEST  09/19/2018   Low risk   COLONOSCOPY W/ POLYPECTOMY  09/2012   Dr. Loreta Ave: diverticulosis.  Recall 2024.   SHOULDER ARTHROSCOPY WITH ROTATOR CUFF REPAIR AND SUBACROMIAL DECOMPRESSION Left 12/12/2021   Procedure: Left shoulder arthroscopy with rotator cuff repair, extensive debridement and subacromial decompression;  Surgeon: Yolonda Kida, MD;  Location: [facility] OR;  Service: Orthopedics;  Laterality: Left;  2 hrs   TOTAL ABDOMINAL HYSTERECTOMY W/ BILATERAL SALPINGOOPHORECTOMY  approx 2006   Nonmalignant reasons   TRANSTHORACIC ECHOCARDIOGRAM  09/19/2018   EF 60-65%, mild LVH, Grd I DD.    Outpatient Medications Prior to Visit  Medication Sig Dispense Refill   DULoxetine (CYMBALTA) 60 MG capsule Take 1 capsule (60 mg total) by mouth daily. 90 capsule 1   fenofibrate 160 MG tablet Take 1 tablet (160 mg total) by mouth daily. 90 tablet 1   gabapentin (NEURONTIN) 100 MG capsule 1-3 tabs po tid 270 capsule 1   hydrochlorothiazide (HYDRODIURIL) 25 MG tablet Take 0.5 tablets (12.5 mg total) by mouth daily. 45 tablet 1   levothyroxine (SYNTHROID) 200 MCG tablet Take 1 tablet (200 mcg total) by mouth daily before  breakfast. 90 tablet 1   metoprolol succinate (TOPROL-XL) 25 MG 24 hr tablet Take 0.5 tablets (12.5 mg total) by mouth daily. 45 tablet 1   TURMERIC-GINGER PO Take by mouth daily.     Vitamin D, Ergocalciferol, (DRISDOL) 1.25 MG (50000 UNIT) CAPS capsule Take 1 capsule (50,000 Units total) by mouth 2 (two) times a week. 24 capsule 1   pantoprazole (PROTONIX) 40 MG tablet 1 tab po bid x 15d 30 tablet 0   promethazine (PHENERGAN) 12.5 MG tablet 1-2 tabs po q6h prn nausea 20 tablet 1   No facility-administered medications prior to visit.    Allergies  Allergen Reactions   Aspirin Shortness Of  Breath and Other (See Comments)    nasal congestion   Nsaids Anaphylaxis   Sulfa Antibiotics Rash    Review of Systems As per HPI  PE:    01/05/2023    2:50 PM 12/31/2022    8:04 AM 12/01/2022    1:04 PM  Vitals with BMI  Height 5' 3.5" 5' 3.5" 5' 3.5"  Weight 318 lbs 10 oz 313 lbs 6 oz 314 lbs 6 oz  BMI 55.55 54.64 54.81  Systolic 99 130 122  Diastolic 67 80 79  Pulse 65 49 56     Physical Exam  General: Alert and well-appearing. No further exam today.  LABS:  Last CBC Lab Results  Component Value Date   WBC 5.8 12/31/2022   HGB 13.8 12/31/2022   HCT 40.8 12/31/2022   MCV 84.6 12/31/2022   MCH 28.2 12/05/2021   RDW 14.7 12/31/2022   PLT 364.0 12/31/2022   Last metabolic panel Lab Results  Component Value Date   GLUCOSE 92 12/31/2022   NA 142 12/31/2022   K 4.0 12/31/2022   CL 108 12/31/2022   CO2 24 12/31/2022   BUN 20 12/31/2022   CREATININE 0.85 12/31/2022   GFRNONAA >60 12/05/2021   CALCIUM 9.4 12/31/2022   PROT 6.0 12/31/2022   ALBUMIN 3.8 12/31/2022   BILITOT 0.5 12/31/2022   ALKPHOS 33 (L) 12/31/2022   AST 16 12/31/2022   ALT 19 12/31/2022   ANIONGAP 7 12/05/2021   Lab Results  Component Value Date   LIPASE 30.0 12/31/2022   IMPRESSION AND PLAN:  #1 nausea without vomiting.  Improvement with pantoprazole.  Suspect gastritis. Reviewed results of blood testing last visit with pt today -->all normal.  Reviewed imaging results with pt today-->right upper quadrant Limited abdominal ultrasound showed no gallstones or acute abnormality.   Continue pantoprazole 40 mg twice daily for at least 1 month duration. Will check H. pylori antibody.   Will do trial of Zofran 4 mg 1-2 tabs 3 times daily as needed to see if this is effective but less sedating.  An After Visit Summary was printed and given to the patient.  FOLLOW UP: Return in about 2 weeks (around 01/19/2023) for f/u gastritis.  Signed:  Santiago Bumpers, MD           01/05/2023

## 2023-01-05 NOTE — Telephone Encounter (Signed)
Provider sent 2 prescriptions for Zofran, pharmacy contacted to cancel rx with sig directions 1 tab tid. Correct rx sig directions 1-2 tabs po tid. Pharmacy cancelled rx.

## 2023-01-06 LAB — H. PYLORI ANTIBODY, IGG: H Pylori IgG: NEGATIVE

## 2023-01-16 ENCOUNTER — Ambulatory Visit: Payer: Managed Care, Other (non HMO) | Admitting: Family Medicine

## 2023-01-16 ENCOUNTER — Encounter: Payer: Self-pay | Admitting: Family Medicine

## 2023-01-16 VITALS — BP 112/71 | HR 61 | Temp 98.1°F | Ht 63.5 in | Wt 315.6 lb

## 2023-01-16 DIAGNOSIS — K2900 Acute gastritis without bleeding: Secondary | ICD-10-CM | POA: Diagnosis not present

## 2023-01-16 MED ORDER — PANTOPRAZOLE SODIUM 40 MG PO TBEC: DELAYED_RELEASE_TABLET | ORAL | 4 refills | Status: DC

## 2023-01-16 NOTE — Progress Notes (Signed)
OFFICE VISIT  01/16/2023  CC:  Chief Complaint  Patient presents with   Medical Management of Chronic Issues    Patient is a 62 y.o. female who presents for nausea without vomiting, suspected gastritis. A/P as of last visit: "#1 nausea without vomiting.  Improvement with pantoprazole.  Suspect gastritis. Reviewed results of blood testing last visit with pt today -->all normal.  Reviewed imaging results with pt today-->right upper quadrant Limited abdominal ultrasound showed no gallstones or acute abnormality.   Continue pantoprazole 40 mg twice daily for at least 1 month duration. Will check H. pylori antibody.   Will do trial of Zofran 4 mg 1-2 tabs 3 times daily as needed to see if this is effective but less sedating."  INTERIM HX: Her H. pylori IgG antibody test was negative.  She feels significantly improved.  When she wakes up in the morning she has some nausea before she takes her first pantoprazole.  It is mild.  She has used Zofran on couple of occasions and it does help. She has changed her diet some.  Currently starting the "factor" diet.  Past Medical History:  Diagnosis Date   Allergy    Anxiety    Asthma    Chronic low back pain    DDD, DJD lumbar multilevel   Elevated transaminase level 06/2021   very mild, onset after starting fenofibrate. Staying on fenofibrate, getting RUQ abd u/s, and repeating labs 1 mo. Viral hep serologies neg.   Fatty liver    u/s 12/2022   Fibromyalgia 2017   Herniated disc L4-L5   High triglycerides    mild.  TLC.   History of kidney stones    Hypertension    Hypothyroidism    IBS (irritable bowel syndrome)    Internal hemorrhoids    Neuropathic pain    Rx'd neurontin by her orthopedist   Osteoarthritis of both knees    Viscosupp started 2023 ([facility])   Palpitations 09/2018   toprol xl started by cardiologist->no tachyarrhythmia on telemetry   Sleep apnea    Sprain of posterior cruciate ligament of knee right knee    Vitamin D deficiency 02/2017   high dose vit D started 02/23/17--vit D still low on recheck.  Needs 50K twice per week dosing indefinitely. Vit D level mid 40s Nov 2019.    Past Surgical History:  Procedure Laterality Date   CARDIOVASCULAR STRESS TEST  09/19/2018   Low risk   COLONOSCOPY W/ POLYPECTOMY  09/2012   Dr. Loreta Ave: diverticulosis.  Recall 2024.   SHOULDER ARTHROSCOPY WITH ROTATOR CUFF REPAIR AND SUBACROMIAL DECOMPRESSION Left 12/12/2021   Procedure: Left shoulder arthroscopy with rotator cuff repair, extensive debridement and subacromial decompression;  Surgeon: Yolonda Kida, MD;  Location: [facility] OR;  Service: Orthopedics;  Laterality: Left;  2 hrs   TOTAL ABDOMINAL HYSTERECTOMY W/ BILATERAL SALPINGOOPHORECTOMY  approx 2006   Nonmalignant reasons   TRANSTHORACIC ECHOCARDIOGRAM  09/19/2018   EF 60-65%, mild LVH, Grd I DD.    Outpatient Medications Prior to Visit  Medication Sig Dispense Refill   DULoxetine (CYMBALTA) 60 MG capsule Take 1 capsule (60 mg total) by mouth daily. 90 capsule 1   fenofibrate 160 MG tablet Take 1 tablet (160 mg total) by mouth daily. 90 tablet 1   gabapentin (NEURONTIN) 100 MG capsule 1-3 tabs po tid 270 capsule 1   hydrochlorothiazide (HYDRODIURIL) 25 MG tablet Take 0.5 tablets (12.5 mg total) by mouth daily. 45 tablet 1   levothyroxine (SYNTHROID) 200 MCG tablet  Take 1 tablet (200 mcg total) by mouth daily before breakfast. 90 tablet 1   metoprolol succinate (TOPROL-XL) 25 MG 24 hr tablet Take 0.5 tablets (12.5 mg total) by mouth daily. 45 tablet 1   ondansetron (ZOFRAN) 4 MG tablet 1-2 tabs po tid prn nausea 20 tablet 1   TURMERIC-GINGER PO Take by mouth daily.     Vitamin D, Ergocalciferol, (DRISDOL) 1.25 MG (50000 UNIT) CAPS capsule Take 1 capsule (50,000 Units total) by mouth 2 (two) times a week. 24 capsule 1   pantoprazole (PROTONIX) 40 MG tablet 1 tab po bid x 15d 30 tablet 0   No facility-administered medications prior to visit.     Allergies  Allergen Reactions   Aspirin Shortness Of Breath and Other (See Comments)    nasal congestion   Nsaids Anaphylaxis   Sulfa Antibiotics Rash    Review of Systems As per HPI  PE:    01/16/2023    8:15 AM 01/05/2023    2:50 PM 12/31/2022    8:04 AM  Vitals with BMI  Height 5' 3.5" 5' 3.5" 5' 3.5"  Weight 315 lbs 10 oz 318 lbs 10 oz 313 lbs 6 oz  BMI 55.02 55.55 54.64  Systolic 112 99 130  Diastolic 71 67 80  Pulse 61 65 49     Physical Exam  Gen: Alert, well appearing.  Patient is oriented to person, place, time, and situation. No further exam today  LABS:  Last CBC Lab Results  Component Value Date   WBC 5.8 12/31/2022   HGB 13.8 12/31/2022   HCT 40.8 12/31/2022   MCV 84.6 12/31/2022   MCH 28.2 12/05/2021   RDW 14.7 12/31/2022   PLT 364.0 12/31/2022   Last metabolic panel Lab Results  Component Value Date   GLUCOSE 92 12/31/2022   NA 142 12/31/2022   K 4.0 12/31/2022   CL 108 12/31/2022   CO2 24 12/31/2022   BUN 20 12/31/2022   CREATININE 0.85 12/31/2022   GFRNONAA >60 12/05/2021   CALCIUM 9.4 12/31/2022   PROT 6.0 12/31/2022   ALBUMIN 3.8 12/31/2022   BILITOT 0.5 12/31/2022   ALKPHOS 33 (L) 12/31/2022   AST 16 12/31/2022   ALT 19 12/31/2022   ANIONGAP 7 12/05/2021   Lab Results  Component Value Date   LIPASE 30.0 12/31/2022   IMPRESSION AND PLAN:  Acute gastritis.  Significantly improved. Continue pantoprazole 40 mg twice a day for the next month and then decrease to once a day. Zofran 4 to 8 mg every 8 hours as needed as well.  An After Visit Summary was printed and given to the patient.  FOLLOW UP: Return for 4-6 mo cpe. Next cpe 05/2023  Signed:  Santiago Bumpers, MD           01/16/2023

## 2023-01-26 ENCOUNTER — Ambulatory Visit
Admission: RE | Admit: 2023-01-26 | Discharge: 2023-01-26 | Disposition: A | Discharge status: Home / Self Care | Payer: Managed Care, Other (non HMO) | Source: Ambulatory Visit | Attending: Family Medicine | Admitting: Family Medicine

## 2023-01-26 DIAGNOSIS — Z1231 Encounter for screening mammogram for malignant neoplasm of breast: Secondary | ICD-10-CM

## 2023-04-21 ENCOUNTER — Other Ambulatory Visit: Payer: Self-pay | Admitting: Family Medicine

## 2023-05-18 ENCOUNTER — Encounter: Payer: Self-pay | Admitting: Family Medicine

## 2023-05-18 MED ORDER — LEVOTHYROXINE SODIUM 200 MCG PO TABS: 200.0000 ug | ORAL_TABLET | Freq: Every day | ORAL | 1 refills | Status: DC

## 2023-05-18 MED ORDER — GABAPENTIN 100 MG PO CAPS: ORAL_CAPSULE | ORAL | 1 refills | Status: DC

## 2023-05-18 MED ORDER — METOPROLOL SUCCINATE ER 25 MG PO TB24: 12.5000 mg | ORAL_TABLET | Freq: Every day | ORAL | 1 refills | Status: DC

## 2023-05-18 MED ORDER — DULOXETINE HCL 60 MG PO CPEP: 60.0000 mg | ORAL_CAPSULE | Freq: Every day | ORAL | 1 refills | Status: DC

## 2023-05-18 MED ORDER — FENOFIBRATE 160 MG PO TABS: 160.0000 mg | ORAL_TABLET | Freq: Every day | ORAL | 1 refills | Status: DC

## 2023-05-18 MED ORDER — HYDROCHLOROTHIAZIDE 25 MG PO TABS: 12.5000 mg | ORAL_TABLET | Freq: Every day | ORAL | 1 refills | Status: DC

## 2023-05-18 MED ORDER — PANTOPRAZOLE SODIUM 40 MG PO TBEC: DELAYED_RELEASE_TABLET | ORAL | 4 refills | Status: DC

## 2023-05-27 ENCOUNTER — Ambulatory Visit (INDEPENDENT_AMBULATORY_CARE_PROVIDER_SITE_OTHER): Payer: Self-pay | Admitting: Family Medicine

## 2023-05-27 ENCOUNTER — Encounter: Payer: Self-pay | Admitting: Family Medicine

## 2023-05-27 VITALS — BP 108/74 | HR 65 | Temp 98.0°F | Wt 309.0 lb

## 2023-05-27 DIAGNOSIS — N3001 Acute cystitis with hematuria: Secondary | ICD-10-CM

## 2023-05-27 DIAGNOSIS — R350 Frequency of micturition: Secondary | ICD-10-CM

## 2023-05-27 DIAGNOSIS — N200 Calculus of kidney: Secondary | ICD-10-CM

## 2023-05-27 LAB — POCT URINALYSIS DIPSTICK
Bilirubin, UA: NEGATIVE
Glucose, UA: NEGATIVE
Ketones, UA: NEGATIVE
Leukocytes, UA: NEGATIVE
Nitrite, UA: NEGATIVE
Protein, UA: NEGATIVE
Spec Grav, UA: 1.025 (ref 1.010–1.025)
Urobilinogen, UA: 0.2 U/dL
pH, UA: 5.5 (ref 5.0–8.0)

## 2023-05-27 MED ORDER — CEFTRIAXONE SODIUM 500 MG IJ SOLR
500.0000 mg | Freq: Once | INTRAMUSCULAR | Status: DC
Start: 2023-05-27 — End: 2023-05-27

## 2023-05-27 MED ORDER — CEFDINIR 300 MG PO CAPS: 300.0000 mg | ORAL_CAPSULE | Freq: Two times a day (BID) | ORAL | 0 refills | Status: DC

## 2023-05-27 MED ORDER — CEFTRIAXONE SODIUM 1 G IJ SOLR
1.0000 g | Freq: Once | INTRAMUSCULAR | Status: AC
Start: 2023-05-27 — End: 2023-05-27
  Administered 2023-05-27: 1 g via INTRAMUSCULAR

## 2023-05-27 MED ORDER — HYDROCODONE-ACETAMINOPHEN 5-325 MG PO TABS: 1.0000 | ORAL_TABLET | Freq: Four times a day (QID) | ORAL | 0 refills | Status: DC | PRN

## 2023-05-27 NOTE — Progress Notes (Signed)
OFFICE VISIT  05/27/2023  CC:  Chief Complaint  Patient presents with   Groin Pain    Groin pain along with nausea and vomiting that started yesterday.    Patient is a 62 y.o. female who presents for right flank and groin pain.  HPI: Onset last night, N/V, suprapubic discomfort, R flank pain.  ?subj fever.   +urinary urgency and frequency, not much urine when voids. +malaise and fatigue.  ROS as above, plus-->  no CP, no SOB, no wheezing, no cough, no dizziness, no HAs, no rashes, no melena/hematochezia.  No polyuria or polydipsia.  No myalgias or arthralgias.  No focal weakness, paresthesias, or tremors.  No acute vision or hearing abnormalities.  No recent changes in lower legs.   No palpitations.    Past Medical History:  Diagnosis Date   Allergy    Anxiety    Asthma    Chronic low back pain    DDD, DJD lumbar multilevel   Elevated transaminase level 06/2021   very mild, onset after starting fenofibrate. Staying on fenofibrate, getting RUQ abd u/s, and repeating labs 1 mo. Viral hep serologies neg.   Fatty liver    u/s 12/2022   Fibromyalgia 2017   Herniated disc L4-L5   High triglycerides    mild.  TLC.   History of kidney stones    Hypertension    Hypothyroidism    IBS (irritable bowel syndrome)    Internal hemorrhoids    Neuropathic pain    Rx'd neurontin by her orthopedist   Osteoarthritis of both knees    Viscosupp started 2023 ([facility])   Palpitations 09/2018   toprol xl started by cardiologist->no tachyarrhythmia on telemetry   Sleep apnea    Sprain of posterior cruciate ligament of knee right knee   Vitamin D deficiency 02/2017   high dose vit D started 02/23/17--vit D still low on recheck.  Needs 50K twice per week dosing indefinitely. Vit D level mid 40s Nov 2019.    Past Surgical History:  Procedure Laterality Date   CARDIOVASCULAR STRESS TEST  09/19/2018   Low risk   COLONOSCOPY W/ POLYPECTOMY  09/2012   Dr. Loreta Ave: diverticulosis.  Recall 2024.    SHOULDER ARTHROSCOPY WITH ROTATOR CUFF REPAIR AND SUBACROMIAL DECOMPRESSION Left 12/12/2021   Procedure: Left shoulder arthroscopy with rotator cuff repair, extensive debridement and subacromial decompression;  Surgeon: Yolonda Kida, MD;  Location: [facility] OR;  Service: Orthopedics;  Laterality: Left;  2 hrs   TOTAL ABDOMINAL HYSTERECTOMY W/ BILATERAL SALPINGOOPHORECTOMY  approx 2006   Nonmalignant reasons   TRANSTHORACIC ECHOCARDIOGRAM  09/19/2018   EF 60-65%, mild LVH, Grd I DD.    Outpatient Medications Prior to Visit  Medication Sig Dispense Refill   DULoxetine (CYMBALTA) 60 MG capsule Take 1 capsule (60 mg total) by mouth daily. 90 capsule 1   fenofibrate 160 MG tablet Take 1 tablet (160 mg total) by mouth daily. 90 tablet 1   gabapentin (NEURONTIN) 100 MG capsule 1-3 tabs po tid 270 capsule 1   hydrochlorothiazide (HYDRODIURIL) 25 MG tablet Take 0.5 tablets (12.5 mg total) by mouth daily. 45 tablet 1   levothyroxine (SYNTHROID) 200 MCG tablet Take 1 tablet (200 mcg total) by mouth daily before breakfast. 90 tablet 1   metoprolol succinate (TOPROL-XL) 25 MG 24 hr tablet Take 0.5 tablets (12.5 mg total) by mouth daily. 45 tablet 1   ondansetron (ZOFRAN) 4 MG tablet 1-2 tabs po tid prn nausea 20 tablet 1   pantoprazole (PROTONIX)  40 MG tablet 1 tab po bid 60 tablet 4   TURMERIC-GINGER PO Take by mouth daily.     Vitamin D, Ergocalciferol, (DRISDOL) 1.25 MG (50000 UNIT) CAPS capsule Take 1 capsule (50,000 Units total) by mouth 2 (two) times a week. 24 capsule 1   No facility-administered medications prior to visit.    Allergies  Allergen Reactions   Aspirin Shortness Of Breath and Other (See Comments)    nasal congestion   Nsaids Anaphylaxis   Sulfa Antibiotics Rash    Review of Systems  As per HPI  PE:    05/27/2023    8:23 AM 01/16/2023    8:15 AM 01/05/2023    2:50 PM  Vitals with BMI  Height  5' 3.5" 5' 3.5"  Weight 309 lbs 315 lbs 10 oz 318 lbs 10 oz  BMI  55.02  55.55  Systolic 108 112 99  Diastolic 74 71 67  Pulse 65 61 65     Physical Exam  Gen: Alert, slightly ill-appearing. NAD.  Patient is oriented to person, place, time, and situation. ZOX:WRUE: no injection, icteris, swelling, or exudate.  EOMI, PERRLA. Mouth: lips without lesion/swelling.  Oral mucosa pink and moist. Oropharynx without erythema, exudate, or swelling.  CV: RRR, no m/r/g.   LUNGS: CTA bilat, nonlabored resps, good aeration in all lung fields. ABD: soft, NT +R CVA TTP mild.  No side or abd or groin TTP.  LABS:  Last CBC Lab Results  Component Value Date   WBC 5.8 12/31/2022   HGB 13.8 12/31/2022   HCT 40.8 12/31/2022   MCV 84.6 12/31/2022   MCH 28.2 12/05/2021   RDW 14.7 12/31/2022   PLT 364.0 12/31/2022   Last metabolic panel Lab Results  Component Value Date   GLUCOSE 92 12/31/2022   NA 142 12/31/2022   K 4.0 12/31/2022   CL 108 12/31/2022   CO2 24 12/31/2022   BUN 20 12/31/2022   CREATININE 0.85 12/31/2022   GFR 74.18 12/31/2022   CALCIUM 9.4 12/31/2022   PROT 6.0 12/31/2022   ALBUMIN 3.8 12/31/2022   BILITOT 0.5 12/31/2022   ALKPHOS 33 (L) 12/31/2022   AST 16 12/31/2022   ALT 19 12/31/2022   ANIONGAP 7 12/05/2021   Last hemoglobin A1c Lab Results  Component Value Date   HGBA1C 5.3 09/19/2018   IMPRESSION AND PLAN:  Suspect right-sided kidney stone. Acute UTI as well. Dipstick UA today 2+ blood, specific gravity 1.025.  Otherwise normal.  Plan:  Rocephin 1 g IM given today. Start cefdinir 300 mg twice daily x 5 days tomorrow. Vicodin 5-3 25, 1-2 every 6 hours as needed, #20. She has Zofran at home. Push fluids.  An After Visit Summary was printed and given to the patient.  FOLLOW UP: Return in about 2 days (around 05/29/2023) for f/u stone/uti.  Signed:  Santiago Bumpers, MD           05/27/2023

## 2023-05-28 LAB — URINE CULTURE
MICRO NUMBER:: 15483922
Result:: NO GROWTH
SPECIMEN QUALITY:: ADEQUATE

## 2023-05-29 ENCOUNTER — Encounter: Payer: Self-pay | Admitting: Family Medicine

## 2023-05-29 ENCOUNTER — Ambulatory Visit (INDEPENDENT_AMBULATORY_CARE_PROVIDER_SITE_OTHER): Payer: Self-pay | Admitting: Family Medicine

## 2023-05-29 VITALS — BP 120/82 | HR 70 | Wt 312.4 lb

## 2023-05-29 DIAGNOSIS — N200 Calculus of kidney: Secondary | ICD-10-CM

## 2023-05-29 NOTE — Progress Notes (Signed)
OFFICE VISIT  05/29/2023  CC:  Chief Complaint  Patient presents with   Urinary Concern    Kidney Stone/UTI f/u. Pt states she thinks she may of passed 1 more kidney stones. Pt today c/o heavy sweating, nausea, and pain in the very lower part of back. Last night has pain with urination and frequency, none of that today.     Patient is a 62 y.o. female who presents for 2-day follow-up suspected kidney stone and UTI. A/P as of last visit: "Suspect right-sided kidney stone. Acute UTI as well. Dipstick UA today 2+ blood, specific gravity 1.025.  Otherwise normal. Plan:  Rocephin 1 g IM given today. Start cefdinir 300 mg twice daily x 5 days tomorrow. Vicodin 5-3 25, 1-2 every 6 hours as needed, #20. She has Zofran at home. Push fluids."   INTERIM HX: Urine culture showed no growth. The pain and wrapping around her right side and going down to the groin/suprapubic area.  She passed several small stones last night.  Since then she has felt much better.  She brought her stones in a bag--> 3 small dark brown stones 1 to 2 mm in size.  No fevers.  She has some pain focally in the left SI joint area the last 12 hours or so. No flank pain, side pain, or abdominal pains, or groin pain.  Past Medical History:  Diagnosis Date   Allergy    Anxiety    Asthma    Chronic low back pain    DDD, DJD lumbar multilevel   Elevated transaminase level 06/2021   very mild, onset after starting fenofibrate. Staying on fenofibrate, getting RUQ abd u/s, and repeating labs 1 mo. Viral hep serologies neg.   Fatty liver    u/s 12/2022   Fibromyalgia 2017   Herniated disc L4-L5   High triglycerides    mild.  TLC.   History of kidney stones    Hypertension    Hypothyroidism    IBS (irritable bowel syndrome)    Internal hemorrhoids    Neuropathic pain    Rx'd neurontin by her orthopedist   Osteoarthritis of both knees    Viscosupp started 2023 ([facility])   Palpitations 09/2018   toprol xl  started by cardiologist->no tachyarrhythmia on telemetry   Sleep apnea    Sprain of posterior cruciate ligament of knee right knee   Vitamin D deficiency 02/2017   high dose vit D started 02/23/17--vit D still low on recheck.  Needs 50K twice per week dosing indefinitely. Vit D level mid 40s Nov 2019.    Past Surgical History:  Procedure Laterality Date   CARDIOVASCULAR STRESS TEST  09/19/2018   Low risk   COLONOSCOPY W/ POLYPECTOMY  09/2012   Dr. Loreta Ave: diverticulosis.  Recall 2024.   SHOULDER ARTHROSCOPY WITH ROTATOR CUFF REPAIR AND SUBACROMIAL DECOMPRESSION Left 12/12/2021   Procedure: Left shoulder arthroscopy with rotator cuff repair, extensive debridement and subacromial decompression;  Surgeon: Yolonda Kida, MD;  Location: [facility] OR;  Service: Orthopedics;  Laterality: Left;  2 hrs   TOTAL ABDOMINAL HYSTERECTOMY W/ BILATERAL SALPINGOOPHORECTOMY  approx 2006   Nonmalignant reasons   TRANSTHORACIC ECHOCARDIOGRAM  09/19/2018   EF 60-65%, mild LVH, Grd I DD.    Outpatient Medications Prior to Visit  Medication Sig Dispense Refill   cefdinir (OMNICEF) 300 MG capsule Take 1 capsule (300 mg total) by mouth 2 (two) times daily. 10 capsule 0   DULoxetine (CYMBALTA) 60 MG capsule Take 1 capsule (60 mg total)  by mouth daily. 90 capsule 1   fenofibrate 160 MG tablet Take 1 tablet (160 mg total) by mouth daily. 90 tablet 1   gabapentin (NEURONTIN) 100 MG capsule 1-3 tabs po tid 270 capsule 1   hydrochlorothiazide (HYDRODIURIL) 25 MG tablet Take 0.5 tablets (12.5 mg total) by mouth daily. 45 tablet 1   HYDROcodone-acetaminophen (NORCO/VICODIN) 5-325 MG tablet Take 1-2 tablets by mouth every 6 (six) hours as needed for moderate pain. 20 tablet 0   levothyroxine (SYNTHROID) 200 MCG tablet Take 1 tablet (200 mcg total) by mouth daily before breakfast. 90 tablet 1   metoprolol succinate (TOPROL-XL) 25 MG 24 hr tablet Take 0.5 tablets (12.5 mg total) by mouth daily. 45 tablet 1   ondansetron  (ZOFRAN) 4 MG tablet 1-2 tabs po tid prn nausea 20 tablet 1   pantoprazole (PROTONIX) 40 MG tablet 1 tab po bid 60 tablet 4   TURMERIC-GINGER PO Take by mouth daily.     Vitamin D, Ergocalciferol, (DRISDOL) 1.25 MG (50000 UNIT) CAPS capsule Take 1 capsule (50,000 Units total) by mouth 2 (two) times a week. 24 capsule 1   No facility-administered medications prior to visit.    Allergies  Allergen Reactions   Aspirin Shortness Of Breath and Other (See Comments)    nasal congestion   Nsaids Anaphylaxis   Sulfa Antibiotics Rash    Review of Systems As per HPI  PE:    05/29/2023    4:21 PM 05/27/2023    8:23 AM 01/16/2023    8:15 AM  Vitals with BMI  Height   5' 3.5"  Weight 312 lbs 6 oz 309 lbs 315 lbs 10 oz  BMI   55.02  Systolic 120 108 161  Diastolic 82 74 71  Pulse 70 65 61     Physical Exam  Gen: Alert, well appearing.  Patient is oriented to person, place, time, and situation. AFFECT: pleasant, lucid thought and speech. No further exam today  LABS:  Last CBC Lab Results  Component Value Date   WBC 5.8 12/31/2022   HGB 13.8 12/31/2022   HCT 40.8 12/31/2022   MCV 84.6 12/31/2022   MCH 28.2 12/05/2021   RDW 14.7 12/31/2022   PLT 364.0 12/31/2022   Last metabolic panel Lab Results  Component Value Date   GLUCOSE 92 12/31/2022   NA 142 12/31/2022   K 4.0 12/31/2022   CL 108 12/31/2022   CO2 24 12/31/2022   BUN 20 12/31/2022   CREATININE 0.85 12/31/2022   GFR 74.18 12/31/2022   CALCIUM 9.4 12/31/2022   PROT 6.0 12/31/2022   ALBUMIN 3.8 12/31/2022   BILITOT 0.5 12/31/2022   ALKPHOS 33 (L) 12/31/2022   AST 16 12/31/2022   ALT 19 12/31/2022   ANIONGAP 7 12/05/2021   Last hemoglobin A1c Lab Results  Component Value Date   HGBA1C 5.3 09/19/2018   IMPRESSION AND PLAN:  #1 right sided nephrolithiasis.  She has passed a few stones and feels better. I will have her finish her cefdinir, although fortunately her urine culture was negative. Continue to  hydrate well. I reassured her that the pain in her left SI region lately is not anything related to urinary/kidney or abdominal problems.  An After Visit Summary was printed and given to the patient.  FOLLOW UP: Return if symptoms worsen or fail to improve.  Signed:  Santiago Bumpers, MD           05/29/2023

## 2023-12-25 ENCOUNTER — Other Ambulatory Visit: Payer: Self-pay | Admitting: Family Medicine

## 2024-01-27 ENCOUNTER — Other Ambulatory Visit: Payer: Self-pay | Admitting: Family Medicine

## 2024-02-02 ENCOUNTER — Other Ambulatory Visit: Payer: Self-pay | Admitting: Family Medicine

## 2024-02-05 ENCOUNTER — Other Ambulatory Visit: Payer: Self-pay

## 2024-02-05 ENCOUNTER — Telehealth: Payer: Self-pay | Admitting: Family Medicine

## 2024-02-05 MED ORDER — FENOFIBRATE 160 MG PO TABS: 160.0000 mg | ORAL_TABLET | Freq: Every day | ORAL | 0 refills | Status: DC

## 2024-02-05 MED ORDER — HYDROCHLOROTHIAZIDE 25 MG PO TABS: 12.5000 mg | ORAL_TABLET | Freq: Every day | ORAL | 0 refills | Status: DC

## 2024-02-05 MED ORDER — LEVOTHYROXINE SODIUM 200 MCG PO TABS: 200.0000 ug | ORAL_TABLET | Freq: Every day | ORAL | 0 refills | Status: DC

## 2024-02-05 MED ORDER — DULOXETINE HCL 60 MG PO CPEP
60.0000 mg | ORAL_CAPSULE | Freq: Every day | ORAL | 0 refills | Status: DC
Start: 2024-02-05 — End: 2024-02-08

## 2024-02-05 NOTE — Telephone Encounter (Signed)
LVM for pt to assist with scheduling.

## 2024-02-05 NOTE — Telephone Encounter (Signed)
Pt scheduled for appt.

## 2024-02-05 NOTE — Telephone Encounter (Signed)
 Patient called, left VM to return the call to the office to schedule an appointment. Medication refusal reason says needs appointment.  Copied from CRM [id number]. Topic: Clinical - Prescription Issue >> Feb 05, 2024  2:01 PM Howard Macho wrote: Reason for CRM: patient called stating the pharmacy[facility] requested DULoxetine  (CYMBALTA ) 60 MG capsule, levothyroxine  (SYNTHROID ) 200 MCG tablet and fenofibrate  160 MG tablet and they were refused by the doctor. Patient wants to request a 90 refill on the medications. Patient has been out of medication for the last week. CB [phone].

## 2024-02-08 ENCOUNTER — Encounter: Payer: Self-pay | Admitting: Family Medicine

## 2024-02-08 ENCOUNTER — Ambulatory Visit: Payer: Self-pay | Admitting: Family Medicine

## 2024-02-08 VITALS — BP 122/70 | HR 62 | Ht 63.5 in | Wt 288.8 lb

## 2024-02-08 DIAGNOSIS — I10 Essential (primary) hypertension: Secondary | ICD-10-CM

## 2024-02-08 DIAGNOSIS — Z1231 Encounter for screening mammogram for malignant neoplasm of breast: Secondary | ICD-10-CM

## 2024-02-08 DIAGNOSIS — E559 Vitamin D deficiency, unspecified: Secondary | ICD-10-CM

## 2024-02-08 DIAGNOSIS — E781 Pure hyperglyceridemia: Secondary | ICD-10-CM

## 2024-02-08 DIAGNOSIS — E039 Hypothyroidism, unspecified: Secondary | ICD-10-CM

## 2024-02-08 DIAGNOSIS — M797 Fibromyalgia: Secondary | ICD-10-CM

## 2024-02-08 MED ORDER — DULOXETINE HCL 60 MG PO CPEP: 60.0000 mg | ORAL_CAPSULE | Freq: Every day | ORAL | 1 refills | Status: AC

## 2024-02-08 MED ORDER — METOPROLOL SUCCINATE ER 25 MG PO TB24: 12.5000 mg | ORAL_TABLET | Freq: Every day | ORAL | 1 refills | Status: DC

## 2024-02-08 MED ORDER — LEVOTHYROXINE SODIUM 200 MCG PO TABS: 200.0000 ug | ORAL_TABLET | Freq: Every day | ORAL | 1 refills | Status: DC

## 2024-02-08 MED ORDER — DULOXETINE HCL 30 MG PO CPEP
30.0000 mg | ORAL_CAPSULE | Freq: Every day | ORAL | 1 refills | Status: DC
Start: 2024-02-08 — End: 2024-03-01

## 2024-02-08 MED ORDER — FENOFIBRATE 160 MG PO TABS: 160.0000 mg | ORAL_TABLET | Freq: Every day | ORAL | 1 refills | Status: DC

## 2024-02-08 MED ORDER — HYDROCHLOROTHIAZIDE 25 MG PO TABS: 12.5000 mg | ORAL_TABLET | Freq: Every day | ORAL | 1 refills | Status: AC

## 2024-02-08 MED ORDER — GABAPENTIN 300 MG PO CAPS: 300.0000 mg | ORAL_CAPSULE | Freq: Three times a day (TID) | ORAL | 1 refills | Status: AC

## 2024-02-08 NOTE — Progress Notes (Signed)
 OFFICE VISIT  02/08/2024  CC:  Chief Complaint  Patient presents with   Medical Management of Chronic Issues    Pt is not fasting    Patient is a 63 y.o. female who presents for follow-up hypertension, hypothyroidism, hypertriglyceridemia, and fibromyalgia.  INTERIM HX: Brenda Schroeder is struggling more with depression and fibromyalgia pain lately. Her husband is having memory loss/cognitive impairment, having to see doctors a lot. Brenda Schroeder is overwhelmed and frustrated with the whole situation, worried. To add to this, she unfortunately had to be out of her medication for a week or so due to some refilling problems.  She just recently got back on them a couple days ago.  She has not had her vitamin D  in quite a while either.  Her fibromyalgia pain is constant in the neck, upper and mid back, and tops of shoulders.  When she has flareups it extends over essentially all of her body and she gets significant brain fog.  She typically takes 2 of the 100 mg of gabapentin  a few times a day, sometimes 3.  PMP AWARE reviewed today: most recent rx for gabapentin  was filled 12/25/2023, # 270, rx by me.  Most recent Vicodin prescription was filled 05/27/2023, #20, prescription by me. No red flags.  ROS as above, plus--> no fevers, no CP, no SOB, no wheezing, no cough, no dizziness, no HAs, no rashes, no melena/hematochezia.  No polyuria or polydipsia.   No focal weakness, paresthesias, or tremors.  No acute vision or hearing abnormalities.  No dysuria or unusual/new urinary urgency or frequency.  No recent changes in lower legs. No n/v/d or abd pain.  No palpitations.    Past Medical History:  Diagnosis Date   Allergy    Anxiety    Asthma    Chronic low back pain    DDD, DJD lumbar multilevel   Elevated transaminase level 06/2021   very mild, onset after starting fenofibrate . Staying on fenofibrate , getting RUQ abd u/s, and repeating labs 1 mo. Viral hep serologies neg.   Fatty liver    u/s 12/2022    Fibromyalgia 2017   Herniated disc L4-L5   High triglycerides    mild.  TLC.   History of kidney stones    Hypertension    Hypothyroidism    IBS (irritable bowel syndrome)    Internal hemorrhoids    Neuropathic pain    Rx'd neurontin  by her orthopedist   Osteoarthritis of both knees    Viscosupp started 2023 ([facility])   Palpitations 09/2018   toprol  xl started by cardiologist->no tachyarrhythmia on telemetry   Sleep apnea    Sprain of posterior cruciate ligament of knee right knee   Vitamin D  deficiency 02/2017   high dose vit D started 02/23/17--vit D still low on recheck.  Needs 50K twice per week dosing indefinitely. Vit D level mid 40s Nov 2019.    Past Surgical History:  Procedure Laterality Date   CARDIOVASCULAR STRESS TEST  09/19/2018   Low risk   COLONOSCOPY W/ POLYPECTOMY  09/2012   Dr. Tova Fresh: diverticulosis.  Recall 2024.   SHOULDER ARTHROSCOPY WITH ROTATOR CUFF REPAIR AND SUBACROMIAL DECOMPRESSION Left 12/12/2021   Procedure: Left shoulder arthroscopy with rotator cuff repair, extensive debridement and subacromial decompression;  Surgeon: Janeth Medicus, MD;  Location: [facility] OR;  Service: Orthopedics;  Laterality: Left;  2 hrs   TOTAL ABDOMINAL HYSTERECTOMY W/ BILATERAL SALPINGOOPHORECTOMY  approx 2006   Nonmalignant reasons   TRANSTHORACIC ECHOCARDIOGRAM  09/19/2018   EF 60-65%,  mild LVH, Grd I DD.    Outpatient Medications Prior to Visit  Medication Sig Dispense Refill   pantoprazole  (PROTONIX ) 40 MG tablet 1 tab po bid 60 tablet 4   DULoxetine  (CYMBALTA ) 60 MG capsule Take 1 capsule (60 mg total) by mouth daily. 30 capsule 0   fenofibrate  160 MG tablet Take 1 tablet (160 mg total) by mouth daily. 30 tablet 0   gabapentin  (NEURONTIN ) 100 MG capsule 1-3 tabs po tid 270 capsule 1   hydrochlorothiazide  (HYDRODIURIL ) 25 MG tablet Take 0.5 tablets (12.5 mg total) by mouth daily. 15 tablet 0   levothyroxine  (SYNTHROID ) 200 MCG tablet Take 1 tablet (200 mcg total)  by mouth daily before breakfast. 30 tablet 0   metoprolol  succinate (TOPROL -XL) 25 MG 24 hr tablet Take 1/2 (one-half) tablet by mouth once daily 45 tablet 0   ondansetron  (ZOFRAN ) 4 MG tablet 1-2 tabs po tid prn nausea (Patient not taking: Reported on 02/08/2024) 20 tablet 1   TURMERIC-GINGER PO Take by mouth daily. (Patient not taking: Reported on 02/08/2024)     Vitamin D , Ergocalciferol , (DRISDOL ) 1.25 MG (50000 UNIT) CAPS capsule Take 1 capsule (50,000 Units total) by mouth 2 (two) times a week. (Patient not taking: Reported on 02/08/2024) 24 capsule 1   HYDROcodone -acetaminophen  (NORCO/VICODIN) 5-325 MG tablet Take 1-2 tablets by mouth every 6 (six) hours as needed for moderate pain. (Patient not taking: Reported on 02/08/2024) 20 tablet 0   No facility-administered medications prior to visit.    Allergies  Allergen Reactions   Aspirin Shortness Of Breath and Other (See Comments)    nasal congestion   Nsaids Anaphylaxis   Sulfa Antibiotics Rash    Review of Systems As per HPI  PE:    02/08/2024    3:59 PM 05/29/2023    4:21 PM 05/27/2023    8:23 AM  Vitals with BMI  Height 5' 3.5"    Weight 288 lbs 13 oz 312 lbs 6 oz 309 lbs  BMI 50.35    Systolic 122 120 409  Diastolic 70 82 74  Pulse 62 70 65     Physical Exam  Gen: Alert, well appearing.  Patient is oriented to person, place, time, and situation. AFFECT: Anxious and depressed but pleasant, lucid thought and speech. No further exam today  LABS:  Last CBC Lab Results  Component Value Date   WBC 5.8 12/31/2022   HGB 13.8 12/31/2022   HCT 40.8 12/31/2022   MCV 84.6 12/31/2022   MCH 28.2 12/05/2021   RDW 14.7 12/31/2022   PLT 364.0 12/31/2022   Last metabolic panel Lab Results  Component Value Date   GLUCOSE 92 12/31/2022   NA 142 12/31/2022   K 4.0 12/31/2022   CL 108 12/31/2022   CO2 24 12/31/2022   BUN 20 12/31/2022   CREATININE 0.85 12/31/2022   GFR 74.18 12/31/2022   CALCIUM 9.4 12/31/2022   PROT 6.0  12/31/2022   ALBUMIN 3.8 12/31/2022   BILITOT 0.5 12/31/2022   ALKPHOS 33 (L) 12/31/2022   AST 16 12/31/2022   ALT 19 12/31/2022   ANIONGAP 7 12/05/2021   Last lipids Lab Results  Component Value Date   CHOL 128 02/24/2022   HDL 54.60 02/24/2022   LDLCALC 50 02/24/2022   LDLDIRECT 49.0 07/26/2021   TRIG 113.0 02/24/2022   CHOLHDL 2 02/24/2022   Last hemoglobin A1c Lab Results  Component Value Date   HGBA1C 5.3 09/19/2018   Last thyroid  functions Lab Results  Component Value  Date   TSH 0.68 02/24/2022   T3TOTAL 84 01/01/2018   Last vitamin D  Lab Results  Component Value Date   VD25OH 72.49 05/27/2022   Last vitamin B12 and Folate Lab Results  Component Value Date   VITAMINB12 >1500 (H) 05/27/2022   IMPRESSION AND PLAN:  #1 major depressive disorder, GAD. Not doing very well.  Will increase Cymbalta  to 60 mg capsule daily plus add 30 mg capsule daily.  2.  Fibromyalgia, not well-controlled. Continue Cymbalta  as above. Increase gabapentin  to a full 300 mg 3 times a day.  3.  Hypertension, well-controlled on one half of the 25 mg tab daily and HCTZ one half of the 25 mg tab daily. Electrolytes and creatinine today.  4.  Hypertriglyceridemia. She has done well long-term on fenofibrate  160 mg a day. She is not fasting today so we will defer lipid panel at this time.  #5 hypothyroidism, doing well long-term on 200 mcg levothyroxine  daily. Of note she was out of the medication for about a week recently. Monitor TSH today.  #6 preventative health care: vaccines: Prevnar 20---> deferred today.  Otherwise all up-to-date. Labs: CMET, vitamin D , TSH.   Cervical ca screening: no further screening indicated b/c hx of TAH for nonmalignant dx. Breast ca screening: Next mammogram due any time now-->ordered. Colon ca screening: No polyps on 2014 colonoscopy.  Recall as of 2024. Osteoporosis screening: Anticipate DEXA at age 73.  An After Visit Summary was printed and  given to the patient.  FOLLOW UP: Return in about 4 weeks (around 03/07/2024) for f/u dep and fibro.  Signed:  Arletha Lady, MD           02/08/2024

## 2024-02-08 NOTE — Patient Instructions (Signed)
 Take 60mg  cymbalta  + 30mg  cymbalta  every day  Take gabapentin  300 mg three times per day

## 2024-02-09 LAB — COMPREHENSIVE METABOLIC PANEL WITH GFR
ALT: 24 U/L (ref 0–35)
AST: 22 U/L (ref 0–37)
Albumin: 4.2 g/dL (ref 3.5–5.2)
Alkaline Phosphatase: 53 U/L (ref 39–117)
BUN: 16 mg/dL (ref 6–23)
CO2: 32 meq/L (ref 19–32)
Calcium: 10.2 mg/dL (ref 8.4–10.5)
Chloride: 104 meq/L (ref 96–112)
Creatinine, Ser: 0.77 mg/dL (ref 0.40–1.20)
GFR: 82.88 mL/min (ref >60.00)
Glucose, Bld: 78 mg/dL (ref 70–99)
Potassium: 4.3 meq/L (ref 3.5–5.1)
Sodium: 141 meq/L (ref 135–145)
Total Bilirubin: 0.5 mg/dL (ref 0.2–1.2)
Total Protein: 6.5 g/dL (ref 6.0–8.3)

## 2024-02-09 LAB — TSH: TSH: 2.33 u[IU]/mL (ref 0.35–5.50)

## 2024-02-09 LAB — VITAMIN D 25 HYDROXY (VIT D DEFICIENCY, FRACTURES): VITD: 28.32 ng/mL — ABNORMAL LOW (ref 30.00–100.00)

## 2024-02-10 ENCOUNTER — Ambulatory Visit: Payer: Self-pay | Admitting: Family Medicine

## 2024-02-10 MED ORDER — VITAMIN D (ERGOCALCIFEROL) 1.25 MG (50000 UNIT) PO CAPS: 50000.0000 [IU] | ORAL_CAPSULE | ORAL | 1 refills | Status: DC

## 2024-02-15 ENCOUNTER — Ambulatory Visit: Payer: Self-pay | Admitting: Family Medicine

## 2024-03-07 ENCOUNTER — Ambulatory Visit (INDEPENDENT_AMBULATORY_CARE_PROVIDER_SITE_OTHER): Payer: Self-pay | Admitting: Family Medicine

## 2024-03-07 ENCOUNTER — Encounter: Payer: Self-pay | Admitting: Family Medicine

## 2024-03-07 VITALS — BP 101/66 | HR 53 | Temp 98.1°F | Ht 63.5 in | Wt 290.4 lb

## 2024-03-07 DIAGNOSIS — E781 Pure hyperglyceridemia: Secondary | ICD-10-CM

## 2024-03-07 DIAGNOSIS — M797 Fibromyalgia: Secondary | ICD-10-CM

## 2024-03-07 DIAGNOSIS — F411 Generalized anxiety disorder: Secondary | ICD-10-CM

## 2024-03-07 DIAGNOSIS — F3341 Major depressive disorder, recurrent, in partial remission: Secondary | ICD-10-CM

## 2024-03-07 LAB — LIPID PANEL
Cholesterol: 155 mg/dL (ref 0–200)
HDL: 54 mg/dL (ref >39.00)
LDL Cholesterol: 78 mg/dL (ref 0–99)
NonHDL: 100.65
Total CHOL/HDL Ratio: 3
Triglycerides: 111 mg/dL (ref 0.0–149.0)
VLDL: 22.2 mg/dL (ref 0.0–40.0)

## 2024-03-07 MED ORDER — DULOXETINE HCL 30 MG PO CPEP: 30.0000 mg | ORAL_CAPSULE | Freq: Every day | ORAL | 1 refills | Status: DC

## 2024-03-07 MED ORDER — VITAMIN D (ERGOCALCIFEROL) 1.25 MG (50000 UNIT) PO CAPS: 50000.0000 [IU] | ORAL_CAPSULE | ORAL | Status: DC

## 2024-03-07 NOTE — Progress Notes (Signed)
 OFFICE VISIT  03/07/2024  CC:  Chief Complaint  Patient presents with   Medical Management of Chronic Issues    4 week f/u depression and fibromyalgia    Patient is a 63 y.o. female who presents accompanied by her husband for 1 month follow-up depression, anxiety, and fibromyalgia. A/P as of last visit: #1 major depressive disorder, GAD. Not doing very well.  Will increase Cymbalta  to 60 mg capsule daily plus add 30 mg capsule daily.   2.  Fibromyalgia, not well-controlled. Continue Cymbalta  as above. Increase gabapentin  to a full 300 mg 3 times a day.   3.  Hypertension, well-controlled on one half of the 25 mg tab daily and HCTZ one half of the 25 mg tab daily. Electrolytes and creatinine today.   4.  Hypertriglyceridemia. She has done well long-term on fenofibrate  160 mg a day. She is not fasting today so we will defer lipid panel at this time.   #5 hypothyroidism, doing well long-term on 200 mcg levothyroxine  daily. Of note she was out of the medication for about a week recently. Monitor TSH today.   #6 preventative health care: vaccines: Prevnar 20---> deferred today.  Otherwise all up-to-date. Labs: CMET, vitamin D , TSH.   Cervical ca screening: no further screening indicated b/c hx of TAH for nonmalignant dx. Breast ca screening: Next mammogram due any time now-->ordered. Colon ca screening: No polyps on 2014 colonoscopy.  Recall as of 2024. Osteoporosis screening: Anticipate DEXA at age 63.  INTERIM HX: Labs all good last visit except vitamin D  low.  We got her back on her 50 K unit vitamin D  supplement twice a week.  She is feeling better from an anxiety and mood standpoint. No problems from the higher dose of Cymbalta .  She does feel like the bedtime dose of 300 mg gabapentin  does make her feel more tired in the morning.  She still wakes up in the night with some pain but gets back to sleep okay.  She tries to eat a diet low in simple and complex  carbohydrates as well as lower fat.   ROS as above, plus--> no fevers, no CP, no SOB, no wheezing, no cough, no dizziness, no HAs, no rashes, no melena/hematochezia.  No polyuria or polydipsia. .  No focal weakness, paresthesias, or tremors.  No acute vision or hearing abnormalities.  No dysuria or unusual/new urinary urgency or frequency.  No recent changes in lower legs. No n/v/d or abd pain.  No palpitations.      Past Medical History:  Diagnosis Date   Allergy    Anxiety    Asthma    Chronic low back pain    DDD, DJD lumbar multilevel   Elevated transaminase level 06/2021   very mild, onset after starting fenofibrate . Staying on fenofibrate , getting RUQ abd u/s, and repeating labs 1 mo. Viral hep serologies neg.   Fatty liver    u/s 12/2022   Fibromyalgia 2017   Herniated disc L4-L5   High triglycerides    mild.  TLC.   History of kidney stones    Hypertension    Hypothyroidism    IBS (irritable bowel syndrome)    Internal hemorrhoids    Neuropathic pain    Rx'd neurontin  by her orthopedist   Osteoarthritis of both knees    Viscosupp started 2023 ([facility])   Palpitations 09/2018   toprol  xl started by cardiologist->no tachyarrhythmia on telemetry   Sleep apnea    Sprain of posterior cruciate ligament of  knee right knee   Vitamin D  deficiency 02/2017   high dose vit D started 02/23/17--vit D still low on recheck.  Needs 50K twice per week dosing indefinitely. Vit D level mid 40s Nov 2019.    Past Surgical History:  Procedure Laterality Date   CARDIOVASCULAR STRESS TEST  09/19/2018   Low risk   COLONOSCOPY W/ POLYPECTOMY  09/2012   Dr. Kristie: diverticulosis.  Recall 2024.   SHOULDER ARTHROSCOPY WITH ROTATOR CUFF REPAIR AND SUBACROMIAL DECOMPRESSION Left 12/12/2021   Procedure: Left shoulder arthroscopy with rotator cuff repair, extensive debridement and subacromial decompression;  Surgeon: Sharl Selinda Dover, MD;  Location: [facility] OR;  Service: Orthopedics;  Laterality:  Left;  2 hrs   TOTAL ABDOMINAL HYSTERECTOMY W/ BILATERAL SALPINGOOPHORECTOMY  approx 2006   Nonmalignant reasons   TRANSTHORACIC ECHOCARDIOGRAM  09/19/2018   EF 60-65%, mild LVH, Grd I DD.    Outpatient Medications Prior to Visit  Medication Sig Dispense Refill   DULoxetine  (CYMBALTA ) 60 MG capsule Take 1 capsule (60 mg total) by mouth daily. 90 capsule 1   fenofibrate  160 MG tablet Take 1 tablet (160 mg total) by mouth daily. 90 tablet 1   gabapentin  (NEURONTIN ) 300 MG capsule Take 1 capsule (300 mg total) by mouth 3 (three) times daily. 270 capsule 1   hydrochlorothiazide  (HYDRODIURIL ) 25 MG tablet Take 0.5 tablets (12.5 mg total) by mouth daily. 45 tablet 1   levothyroxine  (SYNTHROID ) 200 MCG tablet Take 1 tablet (200 mcg total) by mouth daily before breakfast. 90 tablet 1   metoprolol  succinate (TOPROL -XL) 25 MG 24 hr tablet Take 0.5 tablets (12.5 mg total) by mouth daily. 45 tablet 1   pantoprazole  (PROTONIX ) 40 MG tablet 1 tab po bid 60 tablet 4   Vitamin D , Ergocalciferol , (DRISDOL ) 1.25 MG (50000 UNIT) CAPS capsule Take 1 capsule (50,000 Units total) by mouth 2 (two) times a week. 24 capsule 1   DULoxetine  (CYMBALTA ) 30 MG capsule Take 1 capsule (30 mg total) by mouth daily. 30 capsule 1   ondansetron  (ZOFRAN ) 4 MG tablet 1-2 tabs po tid prn nausea (Patient not taking: Reported on 03/07/2024) 20 tablet 1   TURMERIC-GINGER PO Take by mouth daily. (Patient not taking: Reported on 03/07/2024)     No facility-administered medications prior to visit.    Allergies  Allergen Reactions   Aspirin Shortness Of Breath and Other (See Comments)    nasal congestion   Nsaids Anaphylaxis   Sulfa Antibiotics Rash    Review of Systems As per HPI  PE:    03/07/2024    8:40 AM 02/08/2024    3:59 PM 05/29/2023    4:21 PM  Vitals with BMI  Height 5' 3.5 5' 3.5   Weight 290 lbs 6 oz 288 lbs 13 oz 312 lbs 6 oz  BMI 50.63 50.35   Systolic 101 122 879  Diastolic 66 70 82  Pulse 53 62 70      Physical Exam  Gen: Alert, well appearing.  Patient is oriented to person, place, time, and situation. AFFECT: pleasant, lucid thought and speech. No further exam today  LABS:  Last CBC Lab Results  Component Value Date   WBC 5.8 12/31/2022   HGB 13.8 12/31/2022   HCT 40.8 12/31/2022   MCV 84.6 12/31/2022   MCH 28.2 12/05/2021   RDW 14.7 12/31/2022   PLT 364.0 12/31/2022   Lab Results  Component Value Date   HGBA1C 5.3 09/19/2018   Last metabolic panel Lab Results  Component  Value Date   GLUCOSE 78 02/08/2024   NA 141 02/08/2024   K 4.3 02/08/2024   CL 104 02/08/2024   CO2 32 02/08/2024   BUN 16 02/08/2024   CREATININE 0.77 02/08/2024   GFR 82.88 02/08/2024   CALCIUM 10.2 02/08/2024   PROT 6.5 02/08/2024   ALBUMIN 4.2 02/08/2024   BILITOT 0.5 02/08/2024   ALKPHOS 53 02/08/2024   AST 22 02/08/2024   ALT 24 02/08/2024   ANIONGAP 7 12/05/2021   Last lipids Lab Results  Component Value Date   CHOL 128 02/24/2022   HDL 54.60 02/24/2022   LDLCALC 50 02/24/2022   LDLDIRECT 49.0 07/26/2021   TRIG 113.0 02/24/2022   CHOLHDL 2 02/24/2022   Last hemoglobin A1c Lab Results  Component Value Date   HGBA1C 5.3 09/19/2018   Last thyroid  functions Lab Results  Component Value Date   TSH 2.33 02/08/2024   T3TOTAL 84 01/01/2018   Last vitamin D  Lab Results  Component Value Date   VD25OH 28.32 (L) 02/08/2024   Last vitamin B12 and Folate Lab Results  Component Value Date   VITAMINB12 >1500 (H) 05/27/2022   IMPRESSION AND PLAN:  1 major depressive disorder, GAD. Improving on Cymbalta  60 mg a day +30 mg a day.  No changes today.   2.  Fibromyalgia, not well-controlled. Continue Cymbalta  as above. Increase gabapentin  to a full 300 mg 3 times a day.  She will give the morning fatigue a little more time to abate before a trial of decreasing her gabapentin  dose in the evenings.   3.  Hypertriglyceridemia. She has done well long-term on fenofibrate  160  mg a day. Fasting lipid panel today.  #4 preventative health: Vaccines: Prevnar 20---> deferred today.  Otherwise all up-to-date.  Cervical ca screening: no further screening indicated b/c hx of TAH for nonmalignant dx. Breast ca screening: Next mammogram due any time now-->ordered. Colon ca screening: No polyps on 2014 colonoscopy.  Recall as of 2024. Osteoporosis screening: Anticipate DEXA at age 74  An After Visit Summary was printed and given to the patient.  FOLLOW UP: Return in about 3 months (around 06/07/2024) for routine chronic illness f/u.  Signed:  Gerlene Hockey, MD           03/07/2024

## 2024-03-08 ENCOUNTER — Ambulatory Visit: Payer: Self-pay | Admitting: Family Medicine

## 2024-06-07 ENCOUNTER — Ambulatory Visit (INDEPENDENT_AMBULATORY_CARE_PROVIDER_SITE_OTHER): Payer: Self-pay | Admitting: Family Medicine

## 2024-06-07 ENCOUNTER — Encounter: Payer: Self-pay | Admitting: Family Medicine

## 2024-06-07 VITALS — BP 119/75 | HR 58 | Temp 98.2°F | Ht 63.5 in | Wt 286.6 lb

## 2024-06-07 DIAGNOSIS — E781 Pure hyperglyceridemia: Secondary | ICD-10-CM

## 2024-06-07 DIAGNOSIS — F3341 Major depressive disorder, recurrent, in partial remission: Secondary | ICD-10-CM

## 2024-06-07 DIAGNOSIS — I10 Essential (primary) hypertension: Secondary | ICD-10-CM

## 2024-06-07 DIAGNOSIS — M797 Fibromyalgia: Secondary | ICD-10-CM

## 2024-06-07 NOTE — Progress Notes (Signed)
 OFFICE VISIT  06/07/2024  CC:  Chief Complaint  Patient presents with   Medical Management of Chronic Issues    Pt is fasting    Patient is a 63 y.o. female who presents for 22-month follow-up depression anxiety, fibromyalgia, and hypertriglyceridemia. A/P as of last visit: 1 major depressive disorder, GAD. Improving on Cymbalta  60 mg a day +30 mg a day.  No changes today.   2.  Fibromyalgia, not well-controlled. Continue Cymbalta  as above. Increase gabapentin  to a full 300 mg 3 times a day.  She will give the morning fatigue a little more time to abate before a trial of decreasing her gabapentin  dose in the evenings.   3.  Hypertriglyceridemia. She has done well long-term on fenofibrate  160 mg a day. Fasting lipid panel today.  INTERIM HX: Last visit her labs were all normal except vitamin D  was low again.  I recommended she restart her 50,000 unit tab of vitamin D  once a week.  She states she is doing better from a mood standpoint on the 90 mg dose of duloxetine  every day.  She does still have chronic widespread body pain.  She is on gabapentin  300 mg at bedtime and this helps but not consistently.  The daytime dosing of gabapentin  caused her too much drowsiness so she stopped it.   PMP AWARE reviewed today: most recent rx for gabapentin  300 mg was filled 6-25, # 270, rx by me. No red flags.  Past Medical History:  Diagnosis Date   Allergy    Anxiety    Asthma    Chronic low back pain    DDD, DJD lumbar multilevel   Elevated transaminase level 06/2021   very mild, onset after starting fenofibrate . Staying on fenofibrate , getting RUQ abd u/s, and repeating labs 1 mo. Viral hep serologies neg.   Fatty liver    u/s 12/2022   Fibromyalgia 2017   Herniated disc L4-L5   High triglycerides    mild.  TLC.   History of kidney stones    Hypertension    Hypothyroidism    IBS (irritable bowel syndrome)    Internal hemorrhoids    Neuropathic pain    Rx'd neurontin  by her  orthopedist   Osteoarthritis of both knees    Viscosupp started 2023 ([facility])   Palpitations 09/2018   toprol  xl started by cardiologist->no tachyarrhythmia on telemetry   Sleep apnea    Sprain of posterior cruciate ligament of knee right knee   Vitamin D  deficiency 02/2017   high dose vit D started 02/23/17--vit D still low on recheck.  Needs 50K twice per week dosing indefinitely. Vit D level mid 40s Nov 2019.    Past Surgical History:  Procedure Laterality Date   CARDIOVASCULAR STRESS TEST  09/19/2018   Low risk   COLONOSCOPY W/ POLYPECTOMY  09/2012   Dr. Kristie: diverticulosis.  Recall 2024.   SHOULDER ARTHROSCOPY WITH ROTATOR CUFF REPAIR AND SUBACROMIAL DECOMPRESSION Left 12/12/2021   Procedure: Left shoulder arthroscopy with rotator cuff repair, extensive debridement and subacromial decompression;  Surgeon: Sharl Selinda Dover, MD;  Location: [facility] OR;  Service: Orthopedics;  Laterality: Left;  2 hrs   TOTAL ABDOMINAL HYSTERECTOMY W/ BILATERAL SALPINGOOPHORECTOMY  approx 2006   Nonmalignant reasons   TRANSTHORACIC ECHOCARDIOGRAM  09/19/2018   EF 60-65%, mild LVH, Grd I DD.    Outpatient Medications Prior to Visit  Medication Sig Dispense Refill   DULoxetine  (CYMBALTA ) 30 MG capsule Take 1 capsule (30 mg total) by mouth daily. 90  capsule 1   DULoxetine  (CYMBALTA ) 60 MG capsule Take 1 capsule (60 mg total) by mouth daily. 90 capsule 1   fenofibrate  160 MG tablet Take 1 tablet (160 mg total) by mouth daily. 90 tablet 1   gabapentin  (NEURONTIN ) 300 MG capsule Take 1 capsule (300 mg total) by mouth 3 (three) times daily. (Patient taking differently: Take 300 mg by mouth at bedtime.) 270 capsule 1   hydrochlorothiazide  (HYDRODIURIL ) 25 MG tablet Take 0.5 tablets (12.5 mg total) by mouth daily. 45 tablet 1   levothyroxine  (SYNTHROID ) 200 MCG tablet Take 1 tablet (200 mcg total) by mouth daily before breakfast. 90 tablet 1   metoprolol  succinate (TOPROL -XL) 25 MG 24 hr tablet Take 0.5  tablets (12.5 mg total) by mouth daily. 45 tablet 1   Vitamin D , Ergocalciferol , (DRISDOL ) 1.25 MG (50000 UNIT) CAPS capsule Take 1 capsule (50,000 Units total) by mouth 2 (two) times a week.     No facility-administered medications prior to visit.    Allergies  Allergen Reactions   Aspirin Shortness Of Breath and Other (See Comments)    nasal congestion   Nsaids Anaphylaxis   Sulfa Antibiotics Rash    Review of Systems As per HPI  PE:    06/07/2024    8:27 AM 03/07/2024    8:40 AM 02/08/2024    3:59 PM  Vitals with BMI  Height 5' 3.5 5' 3.5 5' 3.5  Weight 286 lbs 10 oz 290 lbs 6 oz 288 lbs 13 oz  BMI 49.97 50.63 50.35  Systolic 119 101 877  Diastolic 75 66 70  Pulse 58 53 62     Physical Exam  Gen: Alert, well appearing.  Patient is oriented to person, place, time, and situation. AFFECT: pleasant, lucid thought and speech. CV: RRR, no m/r/g.   LUNGS: CTA bilat, nonlabored resps, good aeration in all lung fields.   LABS:  Last CBC Lab Results  Component Value Date   WBC 5.8 12/31/2022   HGB 13.8 12/31/2022   HCT 40.8 12/31/2022   MCV 84.6 12/31/2022   MCH 28.2 12/05/2021   RDW 14.7 12/31/2022   PLT 364.0 12/31/2022   Last metabolic panel Lab Results  Component Value Date   GLUCOSE 78 02/08/2024   NA 141 02/08/2024   K 4.3 02/08/2024   CL 104 02/08/2024   CO2 32 02/08/2024   BUN 16 02/08/2024   CREATININE 0.77 02/08/2024   GFR 82.88 02/08/2024   CALCIUM 10.2 02/08/2024   PROT 6.5 02/08/2024   ALBUMIN 4.2 02/08/2024   BILITOT 0.5 02/08/2024   ALKPHOS 53 02/08/2024   AST 22 02/08/2024   ALT 24 02/08/2024   ANIONGAP 7 12/05/2021   Last lipids Lab Results  Component Value Date   CHOL 155 03/07/2024   HDL 54.00 03/07/2024   LDLCALC 78 03/07/2024   LDLDIRECT 49.0 07/26/2021   TRIG 111.0 03/07/2024   CHOLHDL 3 03/07/2024   Last hemoglobin A1c Lab Results  Component Value Date   HGBA1C 5.3 09/19/2018   Last thyroid  functions Lab Results   Component Value Date   TSH 2.33 02/08/2024   T3TOTAL 84 01/01/2018   Last vitamin D  Lab Results  Component Value Date   VD25OH 28.32 (L) 02/08/2024   Last vitamin B12 and Folate Lab Results  Component Value Date   VITAMINB12 >1500 (H) 05/27/2022   Lab Results  Component Value Date   ESRSEDRATE 7 05/16/2016   IMPRESSION AND PLAN:  #1 recurrent major depressive disorder, GAD. She  is improved on duloxetine  90 mg a day.  #2 fibromyalgia.  Stable.  Continue 90 mg Cymbalta  daily and 300 mg gabapentin  nightly.  #3 hypertension, well-controlled on HCTZ 25 mg, half tab daily.  Also Toprol -XL one half of the 25 mg tab daily. Electrolytes and creatinine have been consistently normal, most recently about 3 months ago.  #4 hypertriglyceridemia. She has done well long-term on fenofibrate  160 mg a day. Triglycerides were 111 approximately 3 months ago.  Hepatic panel was normal at that time.  #5 preventative health care: vaccines: Prevnar 20---> deferred today.  Otherwise all up-to-date. Cervical ca screening: no further screening indicated b/c hx of TAH for nonmalignant dx. Breast ca screening: Next mammogram due any time now-->ordered. Colon ca screening: No polyps on 2014 colonoscopy.  Cologuard NEG 2024 (Dr. Kristie). Osteoporosis screening: Anticipate DEXA at age 8  An After Visit Summary was printed and given to the patient.  FOLLOW UP: Return in about 6 months (around 12/05/2024) for routine chronic illness f/u.  Signed:  Gerlene Hockey, MD           06/07/2024

## 2024-08-26 ENCOUNTER — Other Ambulatory Visit: Payer: Self-pay | Admitting: Family Medicine

## 2024-08-31 ENCOUNTER — Other Ambulatory Visit: Payer: Self-pay | Admitting: Family Medicine

## 2024-09-22 ENCOUNTER — Other Ambulatory Visit: Payer: Self-pay | Admitting: Family Medicine

## 2024-12-05 ENCOUNTER — Ambulatory Visit: Payer: Self-pay | Admitting: Family Medicine
